# Patient Record
Sex: Female | Born: 1935
Health system: Southern US, Community
[De-identification: ages and names within clinical notes are randomized; demographics above are authoritative.]

## PROBLEM LIST (undated history)

## (undated) DIAGNOSIS — M1990 Unspecified osteoarthritis, unspecified site: Secondary | ICD-10-CM

## (undated) DIAGNOSIS — M810 Age-related osteoporosis without current pathological fracture: Secondary | ICD-10-CM

## (undated) DIAGNOSIS — E079 Disorder of thyroid, unspecified: Secondary | ICD-10-CM

## (undated) DIAGNOSIS — I10 Essential (primary) hypertension: Secondary | ICD-10-CM

## (undated) DIAGNOSIS — E785 Hyperlipidemia, unspecified: Secondary | ICD-10-CM

## (undated) DIAGNOSIS — C50919 Malignant neoplasm of unspecified site of unspecified female breast: Secondary | ICD-10-CM

## (undated) HISTORY — DX: Malignant neoplasm of unspecified site of unspecified female breast: C50.919

## (undated) HISTORY — PX: APPENDECTOMY: SHX54

## (undated) HISTORY — PX: COLONOSCOPY: SHX174

## (undated) HISTORY — DX: Unspecified osteoarthritis, unspecified site: M19.90

## (undated) HISTORY — PX: ABDOMINAL HYSTERECTOMY: SHX81

## (undated) HISTORY — PX: OTHER SURGICAL HISTORY: SHX169

## (undated) HISTORY — PX: MOUTH SURGERY: SHX715

---

## 1997-09-20 ENCOUNTER — Other Ambulatory Visit: Admission: RE | Admit: 1997-09-20 | Discharge: 1997-09-20 | Payer: Self-pay | Admitting: *Deleted

## 1998-09-23 ENCOUNTER — Other Ambulatory Visit: Admission: RE | Admit: 1998-09-23 | Discharge: 1998-09-23 | Payer: Self-pay | Admitting: *Deleted

## 1999-09-26 ENCOUNTER — Other Ambulatory Visit: Admission: RE | Admit: 1999-09-26 | Discharge: 1999-09-26 | Payer: Self-pay | Admitting: *Deleted

## 2001-10-01 ENCOUNTER — Other Ambulatory Visit: Admission: RE | Admit: 2001-10-01 | Discharge: 2001-10-01 | Payer: Self-pay | Admitting: Family Medicine

## 2005-09-28 ENCOUNTER — Other Ambulatory Visit: Admission: RE | Admit: 2005-09-28 | Discharge: 2005-09-28 | Payer: Self-pay | Admitting: Family Medicine

## 2010-09-08 ENCOUNTER — Ambulatory Visit
Admission: RE | Admit: 2010-09-08 | Discharge: 2010-09-08 | Disposition: A | Discharge status: Home / Self Care | Payer: Medicare Other | Source: Ambulatory Visit | Attending: Family Medicine | Admitting: Family Medicine

## 2010-09-08 ENCOUNTER — Other Ambulatory Visit: Payer: Self-pay | Admitting: Family Medicine

## 2010-09-08 DIAGNOSIS — M25559 Pain in unspecified hip: Secondary | ICD-10-CM

## 2011-04-13 DIAGNOSIS — M84353A Stress fracture, unspecified femur, initial encounter for fracture: Secondary | ICD-10-CM | POA: Diagnosis not present

## 2011-04-16 DIAGNOSIS — M84353A Stress fracture, unspecified femur, initial encounter for fracture: Secondary | ICD-10-CM | POA: Diagnosis not present

## 2011-04-17 DIAGNOSIS — M84353A Stress fracture, unspecified femur, initial encounter for fracture: Secondary | ICD-10-CM | POA: Diagnosis not present

## 2011-04-19 DIAGNOSIS — M84353A Stress fracture, unspecified femur, initial encounter for fracture: Secondary | ICD-10-CM | POA: Diagnosis not present

## 2011-06-11 DIAGNOSIS — H40059 Ocular hypertension, unspecified eye: Secondary | ICD-10-CM | POA: Diagnosis not present

## 2011-08-16 DIAGNOSIS — M25519 Pain in unspecified shoulder: Secondary | ICD-10-CM | POA: Diagnosis not present

## 2011-08-16 DIAGNOSIS — M949 Disorder of cartilage, unspecified: Secondary | ICD-10-CM | POA: Diagnosis not present

## 2011-08-16 DIAGNOSIS — E785 Hyperlipidemia, unspecified: Secondary | ICD-10-CM | POA: Diagnosis not present

## 2011-08-16 DIAGNOSIS — Z1331 Encounter for screening for depression: Secondary | ICD-10-CM | POA: Diagnosis not present

## 2011-08-16 DIAGNOSIS — I10 Essential (primary) hypertension: Secondary | ICD-10-CM | POA: Diagnosis not present

## 2011-08-16 DIAGNOSIS — E039 Hypothyroidism, unspecified: Secondary | ICD-10-CM | POA: Diagnosis not present

## 2011-08-16 DIAGNOSIS — Z791 Long term (current) use of non-steroidal anti-inflammatories (NSAID): Secondary | ICD-10-CM | POA: Diagnosis not present

## 2011-08-16 DIAGNOSIS — G479 Sleep disorder, unspecified: Secondary | ICD-10-CM | POA: Diagnosis not present

## 2011-08-16 DIAGNOSIS — M899 Disorder of bone, unspecified: Secondary | ICD-10-CM | POA: Diagnosis not present

## 2011-08-23 DIAGNOSIS — M949 Disorder of cartilage, unspecified: Secondary | ICD-10-CM | POA: Diagnosis not present

## 2011-08-23 DIAGNOSIS — M899 Disorder of bone, unspecified: Secondary | ICD-10-CM | POA: Diagnosis not present

## 2011-09-04 DIAGNOSIS — M7500 Adhesive capsulitis of unspecified shoulder: Secondary | ICD-10-CM | POA: Diagnosis not present

## 2011-09-04 DIAGNOSIS — M25819 Other specified joint disorders, unspecified shoulder: Secondary | ICD-10-CM | POA: Diagnosis not present

## 2011-09-20 DIAGNOSIS — M949 Disorder of cartilage, unspecified: Secondary | ICD-10-CM | POA: Diagnosis not present

## 2011-09-20 DIAGNOSIS — M899 Disorder of bone, unspecified: Secondary | ICD-10-CM | POA: Diagnosis not present

## 2011-10-17 DIAGNOSIS — K1370 Unspecified lesions of oral mucosa: Secondary | ICD-10-CM | POA: Diagnosis not present

## 2011-10-23 DIAGNOSIS — R221 Localized swelling, mass and lump, neck: Secondary | ICD-10-CM | POA: Diagnosis not present

## 2011-10-23 DIAGNOSIS — R220 Localized swelling, mass and lump, head: Secondary | ICD-10-CM | POA: Diagnosis not present

## 2011-11-21 DIAGNOSIS — M8708 Idiopathic aseptic necrosis of bone, other site: Secondary | ICD-10-CM | POA: Diagnosis not present

## 2012-01-01 DIAGNOSIS — H26499 Other secondary cataract, unspecified eye: Secondary | ICD-10-CM | POA: Diagnosis not present

## 2012-02-14 DIAGNOSIS — G479 Sleep disorder, unspecified: Secondary | ICD-10-CM | POA: Diagnosis not present

## 2012-02-14 DIAGNOSIS — Z23 Encounter for immunization: Secondary | ICD-10-CM | POA: Diagnosis not present

## 2012-02-14 DIAGNOSIS — M899 Disorder of bone, unspecified: Secondary | ICD-10-CM | POA: Diagnosis not present

## 2012-02-14 DIAGNOSIS — E785 Hyperlipidemia, unspecified: Secondary | ICD-10-CM | POA: Diagnosis not present

## 2012-02-14 DIAGNOSIS — E039 Hypothyroidism, unspecified: Secondary | ICD-10-CM | POA: Diagnosis not present

## 2012-02-14 DIAGNOSIS — M949 Disorder of cartilage, unspecified: Secondary | ICD-10-CM | POA: Diagnosis not present

## 2012-02-14 DIAGNOSIS — I10 Essential (primary) hypertension: Secondary | ICD-10-CM | POA: Diagnosis not present

## 2012-02-26 DIAGNOSIS — M899 Disorder of bone, unspecified: Secondary | ICD-10-CM | POA: Diagnosis not present

## 2012-05-28 ENCOUNTER — Emergency Department (HOSPITAL_COMMUNITY): Payer: Medicare PPO

## 2012-05-28 ENCOUNTER — Encounter (HOSPITAL_COMMUNITY): Payer: Self-pay | Admitting: Emergency Medicine

## 2012-05-28 ENCOUNTER — Inpatient Hospital Stay (HOSPITAL_COMMUNITY)
Admission: EM | Admit: 2012-05-28 | Discharge: 2012-05-29 | DRG: 313 | Disposition: A | Discharge status: Home / Self Care | Payer: Medicare PPO | Attending: Emergency Medicine | Admitting: Emergency Medicine

## 2012-05-28 DIAGNOSIS — E785 Hyperlipidemia, unspecified: Secondary | ICD-10-CM | POA: Diagnosis present

## 2012-05-28 DIAGNOSIS — R079 Chest pain, unspecified: Secondary | ICD-10-CM | POA: Diagnosis present

## 2012-05-28 DIAGNOSIS — E039 Hypothyroidism, unspecified: Secondary | ICD-10-CM | POA: Diagnosis present

## 2012-05-28 DIAGNOSIS — I10 Essential (primary) hypertension: Secondary | ICD-10-CM | POA: Diagnosis not present

## 2012-05-28 DIAGNOSIS — R0789 Other chest pain: Principal | ICD-10-CM | POA: Diagnosis present

## 2012-05-28 HISTORY — DX: Disorder of thyroid, unspecified: E07.9

## 2012-05-28 HISTORY — DX: Age-related osteoporosis without current pathological fracture: M81.0

## 2012-05-28 HISTORY — DX: Essential (primary) hypertension: I10

## 2012-05-28 HISTORY — DX: Hyperlipidemia, unspecified: E78.5

## 2012-05-28 LAB — BASIC METABOLIC PANEL
CO2: 25 mEq/L (ref 19–32)
Chloride: 104 mEq/L (ref 96–112)
GFR calc Af Amer: >90 mL/min (ref >90)
Potassium: 3.8 mEq/L (ref 3.5–5.1)

## 2012-05-28 LAB — POCT I-STAT TROPONIN I: Troponin i, poc: 0.01 ng/mL (ref 0.00–0.08)

## 2012-05-28 LAB — CBC
HCT: 38.5 % (ref 36.0–46.0)
Platelets: 168 10*3/uL (ref 150–400)
RBC: 4.23 MIL/uL (ref 3.87–5.11)
RDW: 12.2 % (ref 11.5–15.5)
WBC: 5.9 10*3/uL (ref 4.0–10.5)

## 2012-05-28 MED ORDER — SODIUM CHLORIDE 0.9 % IJ SOLN: 3.0000 mL | INTRAMUSCULAR | Status: DC | PRN

## 2012-05-28 MED ORDER — HYDROCHLOROTHIAZIDE 25 MG PO TABS
25.0000 mg | ORAL_TABLET | Freq: Every day | ORAL | Status: DC
  Administered 2012-05-29: 25 mg via ORAL
  Filled 2012-05-28: qty 1

## 2012-05-28 MED ORDER — SODIUM CHLORIDE 0.9 % IJ SOLN
3.0000 mL | Freq: Two times a day (BID) | INTRAMUSCULAR | Status: DC
  Administered 2012-05-29: 3 mL via INTRAVENOUS

## 2012-05-28 MED ORDER — MORPHINE SULFATE 2 MG/ML IJ SOLN: 1.0000 mg | INTRAMUSCULAR | Status: DC | PRN

## 2012-05-28 MED ORDER — SODIUM CHLORIDE 0.9 % IV SOLN: 250.0000 mL | INTRAVENOUS | Status: DC | PRN

## 2012-05-28 MED ORDER — SIMVASTATIN 40 MG PO TABS
40.0000 mg | ORAL_TABLET | Freq: Every evening | ORAL | Status: DC
  Administered 2012-05-29: 40 mg via ORAL
  Filled 2012-05-28: qty 1

## 2012-05-28 MED ORDER — ASPIRIN EC 81 MG PO TBEC
81.0000 mg | DELAYED_RELEASE_TABLET | Freq: Every morning | ORAL | Status: DC
  Administered 2012-05-29: 81 mg via ORAL
  Filled 2012-05-28: qty 1

## 2012-05-28 MED ORDER — NITROGLYCERIN 0.4 MG SL SUBL: 0.4000 mg | SUBLINGUAL_TABLET | SUBLINGUAL | Status: DC | PRN

## 2012-05-28 MED ORDER — PANTOPRAZOLE SODIUM 40 MG IV SOLR
40.0000 mg | Freq: Every day | INTRAVENOUS | Status: DC
  Administered 2012-05-28: 40 mg via INTRAVENOUS
  Filled 2012-05-28 (×2): qty 40

## 2012-05-28 MED ORDER — HEPARIN SODIUM (PORCINE) 5000 UNIT/ML IJ SOLN
5000.0000 [IU] | Freq: Three times a day (TID) | INTRAMUSCULAR | Status: DC
  Administered 2012-05-28 – 2012-05-29 (×3): 5000 [IU] via SUBCUTANEOUS
  Filled 2012-05-28 (×5): qty 1

## 2012-05-28 MED ORDER — LOSARTAN POTASSIUM-HCTZ 100-12.5 MG PO TABS: 1.0000 | ORAL_TABLET | Freq: Every morning | ORAL | Status: DC

## 2012-05-28 MED ORDER — LEVOTHYROXINE SODIUM 50 MCG PO TABS
50.0000 ug | ORAL_TABLET | Freq: Every day | ORAL | Status: DC
  Administered 2012-05-29: 50 ug via ORAL
  Filled 2012-05-28 (×2): qty 1

## 2012-05-28 MED ORDER — LOSARTAN POTASSIUM 50 MG PO TABS
100.0000 mg | ORAL_TABLET | Freq: Every day | ORAL | Status: DC
  Administered 2012-05-29: 100 mg via ORAL
  Filled 2012-05-28: qty 2

## 2012-05-28 MED ORDER — VITAMIN E 180 MG (400 UNIT) PO CAPS
400.0000 [IU] | ORAL_CAPSULE | Freq: Every evening | ORAL | Status: DC
  Administered 2012-05-29: 400 [IU] via ORAL
  Filled 2012-05-28: qty 1

## 2012-05-28 NOTE — H&P (Signed)
[facility] History and Physical  [name]:[id number] DOB: 1926-07-29 DOA: 05/28/2012  Referring physician: Dr. Freida Busman PCP: No primary provider on file.  Specialists: none  Chief Complaint: Chest pain  HPI: Becky Montoya is a 79 y.o. female  With history of HTN, hpl,  and hypothyroidism presenting to the ed after an episode of chest pain which occurred earlier today while she was with her husband.  She states that she went from sitting to standing and developed chest discomfort which lasted 10 minutes.  Was described as a chest tightness which seemed to be located mid substernal.  She states that she took an aspirin rested and it improved.  Has denied any more chest discomfort.  Denies any hemoptysis, SOB, cough.  Also states that it seemed to go up into her left shoulder.  Denies any recent fever or chills  In ED troponin was negative and VSS.  Given history of chest discomfort we were contacted for further evaluation and recommendations   Review of Systems: 10 point review of system reviewed and negative unless otherwise mentioned above  Past Medical History  Diagnosis Date  . Hypertension   . Thyroid disease   . Hyperlipidemia   . Osteoporosis    Past Surgical History  Procedure Laterality Date  . Abdominal hysterectomy    . Fissures     Social History:  reports that she has never smoked. She has never used smokeless tobacco. She reports that she does not drink alcohol or use illicit drugs. Lives at home with husband  Can patient participate in ADLs? yes  Allergies  Allergen Reactions  . Wellbutrin (Bupropion) Other (See Comments)    Nervousness    History reviewed. No pertinent family history. Reportedly family history of heart disease. Mother died at 4 of heart disease  Prior to Admission medications   Medication Sig Start Date End Date Taking? Authorizing Provider  aspirin EC 81 MG tablet Take 81 mg by mouth every morning.   Yes Historical Provider,  MD  denosumab (PROLIA) 60 MG/ML SOLN injection Inject 60 mg into the skin every 6 (six) months. Administer in upper arm, thigh, or abdomen   Yes Historical Provider, MD  levothyroxine (SYNTHROID, LEVOTHROID) 50 MCG tablet Take 50 mcg by mouth every morning.   Yes Historical Provider, MD  losartan-hydrochlorothiazide (HYZAAR) 100-12.5 MG per tablet Take 1 tablet by mouth every morning.   Yes Historical Provider, MD  Misc Natural Products (LUTEIN 20) CAPS Take 1 capsule by mouth every morning.   Yes Historical Provider, MD  Multiple Vitamin (MULTIVITAMIN) LIQD Take 30 mLs by mouth every morning.    Yes Historical Provider, MD  simvastatin (ZOCOR) 40 MG tablet Take 40 mg by mouth every evening.   Yes Historical Provider, MD  vitamin E 400 UNIT capsule Take 400 Units by mouth every evening.   Yes Historical Provider, MD   Physical Exam: Filed Vitals:   05/28/12 1800  BP: 169/73  Pulse: 90  Temp: 98.2 F (36.8 C)  TempSrc: Oral  Resp: 20  SpO2: 96%     General:  Pt in NAD, Alert and oriented  Eyes: EOMI, non icteric  ENT: normal exterior appearance, moist mucous membranes  Neck: supple. No goiter  Cardiovascular: RRR, no MRG  Respiratory: CTA BL, no wheezes   Abdomen: soft, NT, ND  Skin: warm and dry  Musculoskeletal: no cyanosis or clubbing  Psychiatric: mood and affect appropriate  Neurologic: answers questions appropriately moves all extremities.  Labs on  Admission:  Basic Metabolic Panel:  Recent Labs Lab 05/28/12 1920  NA 140  K 3.8  CL 104  CO2 25  GLUCOSE 97  BUN 16  CREATININE 0.69  CALCIUM 9.1   Liver Function Tests: No results found for this basename: AST, ALT, ALKPHOS, BILITOT, PROT, ALBUMIN,  in the last 168 hours No results found for this basename: LIPASE, AMYLASE,  in the last 168 hours No results found for this basename: AMMONIA,  in the last 168 hours CBC:  Recent Labs Lab 05/28/12 1920  WBC 5.9  HGB 13.1  HCT 38.5  MCV 91.0  PLT 168    Cardiac Enzymes: No results found for this basename: CKTOTAL, CKMB, CKMBINDEX, TROPONINI,  in the last 168 hours  BNP (last 3 results) No results found for this basename: PROBNP,  in the last 8760 hours CBG: No results found for this basename: GLUCAP,  in the last 168 hours  Radiological Exams on Admission: Dg Chest 2 View  05/28/2012  *RADIOLOGY REPORT*  Clinical Data: Chest pain, hypertension  CHEST - 2 VIEW  Comparison: None.  Findings: Pulmonary hyperexpansion and central bronchitic changes. Prominent calcification of the anterior costochondral junctions. Cardiac and mediastinal contours are within normal limits.  There is atherosclerotic calcification in the transverse aorta.  No acute osseous abnormality.  IMPRESSION:  1.  No acute cardiopulmonary disease. 2.  Hyperinflation and central bronchitic changes as can be seen with COPD. 3.  Aortic atherosclerosis   Original Report Authenticated By: Malachy Moan, M.D.     EKG: Independently reviewed. Sinus rhythm no ST elevation or depression  Assessment/Plan Active Problems:   Chest pain Hypothyroidism htn  1. Chest pain - rule out ACS - telemetry monitoring - troponin x 3 first set in ED negative - cardiac diet - place on protonix - supplemental oxygen as needed - Will order prn nitroglycerin SL tablets for chest pain, morphine for severe chest pain - EKG in AM and prn chest pain  2. Hypothyroidism - stable continue home regimen  3. HTN - will monitor BP's and continue home regimen  4. DVT prophylaxis - heparin   Code Status: full Family Communication: spoke with patient and husband at bedside Disposition Plan:  Pending further results of work up.  Given history of chest tightness if no improvement would consider stress testing   Time spent: > 60 minutes  Penny Pia [facility] Pager [phone]  If 7PM-7AM, please contact night-coverage [email or web address] Password [facility] 05/28/2012, 9:54 PM

## 2012-05-28 NOTE — ED Provider Notes (Signed)
History     CSN: [id number]  Arrival date & time 05/28/12  1754   First MD Initiated Contact with Patient 05/28/12 1831      Chief Complaint  Patient presents with  . Chest Pain    (Consider location/radiation/quality/duration/timing/severity/associated sxs/prior treatment) Patient is a 79 y.o. female presenting with chest pain. The history is provided by the patient.  Chest Pain  patient here with substernal chest heaviness and pressure that began at rest which lasted for approximately 10-15 minutes. She has associated nausea but denies any dyspnea or diaphoresis. Patient took aspirin with some relief. Has no prior history of same. Patient has not had a return of her symptoms. She denies any history of coronary artery disease. She is currently pain free  Past Medical History  Diagnosis Date  . Hypertension   . Thyroid disease   . Hyperlipidemia     Past Surgical History  Procedure Laterality Date  . Abdominal hysterectomy      No family history on file.  History  Substance Use Topics  . Smoking status: Never Smoker   . Smokeless tobacco: Not on file  . Alcohol Use: No    OB History   Grav Para Term Preterm Abortions TAB SAB Ect Mult Living                  Review of Systems  Cardiovascular: Positive for chest pain.  All other systems reviewed and are negative.    Allergies  Wellbutrin  Home Medications   Current Outpatient Rx  Name  Route  Sig  Dispense  Refill  . aspirin EC 81 MG tablet   Oral   Take 81 mg by mouth every morning.         . denosumab (PROLIA) 60 MG/ML SOLN injection   Subcutaneous   Inject 60 mg into the skin every 6 (six) months. Administer in upper arm, thigh, or abdomen         . levothyroxine (SYNTHROID, LEVOTHROID) 50 MCG tablet   Oral   Take 50 mcg by mouth every morning.         Marland Kitchen losartan-hydrochlorothiazide (HYZAAR) 100-12.5 MG per tablet   Oral   Take 1 tablet by mouth every morning.         . Misc Natural  Products (LUTEIN 20) CAPS   Oral   Take 1 capsule by mouth every morning.         . Multiple Vitamin (MULTIVITAMIN) LIQD   Oral   Take 30 mLs by mouth every morning.          . simvastatin (ZOCOR) 40 MG tablet   Oral   Take 40 mg by mouth every evening.         . vitamin E 400 UNIT capsule   Oral   Take 400 Units by mouth every evening.           BP 169/73  Pulse 90  Temp(Src) 98.2 F (36.8 C) (Oral)  Resp 20  SpO2 96%  Physical Exam  Nursing note and vitals reviewed. Constitutional: She is oriented to person, place, and time. She appears well-developed and well-nourished.  Non-toxic appearance. No distress.  HENT:  Head: Normocephalic and atraumatic.  Eyes: Conjunctivae, EOM and lids are normal. Pupils are equal, round, and reactive to light.  Neck: Normal range of motion. Neck supple. No tracheal deviation present. No mass present.  Cardiovascular: Normal rate, regular rhythm and normal heart sounds.  Exam reveals no gallop.  No murmur heard. Pulmonary/Chest: Effort normal and breath sounds normal. No stridor. No respiratory distress. She has no decreased breath sounds. She has no wheezes. She has no rhonchi. She has no rales.  Abdominal: Soft. Normal appearance and bowel sounds are normal. She exhibits no distension. There is no tenderness. There is no rebound and no CVA tenderness.  Musculoskeletal: Normal range of motion. She exhibits no edema and no tenderness.  Neurological: She is alert and oriented to person, place, and time. She has normal strength. No cranial nerve deficit or sensory deficit. GCS eye subscore is 4. GCS verbal subscore is 5. GCS motor subscore is 6.  Skin: Skin is warm and dry. No abrasion and no rash noted.  Psychiatric: She has a normal mood and affect. Her speech is normal and behavior is normal.    ED Course  Procedures (including critical care time)  Labs Reviewed  CBC  BASIC METABOLIC PANEL   No results found.   No  diagnosis found.    MDM   Date: 05/28/2012  Rate: 83  Rhythm: normal sinus rhythm  QRS Axis: normal  Intervals: normal  ST/T Wave abnormalities: normal  Conduction Disutrbances:none  Narrative Interpretation:   Old EKG Reviewed: none available  Pt to be admiited for eval of chest pain        Toy Baker, MD 05/28/12 2053

## 2012-05-28 NOTE — ED Notes (Signed)
Pt reports chest pain that occurred earlier this evening - episode lasted approx , pain was described as mid chest pain heaviness. Pt denies shortness of breath, dizziness, or vomiting - admits to some mild nausea. Pt denies any chest pain at present. Pt also admits to prevalent family hx of cardiac disease, her mother passed away in her 56s from MI and her sister has had several MIs and another sister w/ other cardiac disease.

## 2012-05-28 NOTE — ED Notes (Signed)
Pt presenting to ed with c/o chest pain while she was at the doctors office with her husband. Pt denies chest pain at this time. Pt states she does have positive nausea no vomiting. Pt denies shortness of breath at this time. Pt states she called her pcp and was told to come to the ED

## 2012-05-29 ENCOUNTER — Encounter (HOSPITAL_COMMUNITY): Payer: Self-pay | Admitting: Physician Assistant

## 2012-05-29 DIAGNOSIS — E785 Hyperlipidemia, unspecified: Secondary | ICD-10-CM | POA: Diagnosis not present

## 2012-05-29 DIAGNOSIS — E039 Hypothyroidism, unspecified: Secondary | ICD-10-CM | POA: Diagnosis not present

## 2012-05-29 DIAGNOSIS — I10 Essential (primary) hypertension: Secondary | ICD-10-CM | POA: Diagnosis not present

## 2012-05-29 DIAGNOSIS — R079 Chest pain, unspecified: Secondary | ICD-10-CM | POA: Diagnosis not present

## 2012-05-29 LAB — TSH: TSH: 1.183 u[IU]/mL (ref 0.350–4.500)

## 2012-05-29 LAB — LIPID PANEL
Total CHOL/HDL Ratio: 3.5 RATIO
VLDL: 19 mg/dL (ref 0–40)

## 2012-05-29 LAB — CBC
HCT: 37.7 % (ref 36.0–46.0)
HCT: 38.5 % (ref 36.0–46.0)
Hemoglobin: 13.1 g/dL (ref 12.0–15.0)
MCH: 31.3 pg (ref 26.0–34.0)
MCV: 90.6 fL (ref 78.0–100.0)
MCV: 91.2 fL (ref 78.0–100.0)
RBC: 4.22 MIL/uL (ref 3.87–5.11)
RDW: 12.4 % (ref 11.5–15.5)
RDW: 12.5 % (ref 11.5–15.5)
WBC: 5.5 10*3/uL (ref 4.0–10.5)
WBC: 6 10*3/uL (ref 4.0–10.5)

## 2012-05-29 LAB — BASIC METABOLIC PANEL
BUN: 15 mg/dL (ref 6–23)
Calcium: 8.6 mg/dL (ref 8.4–10.5)
Chloride: 107 mEq/L (ref 96–112)
Creatinine, Ser: 0.72 mg/dL (ref 0.50–1.10)
GFR calc Af Amer: 90 mL/min — ABNORMAL LOW (ref >90)

## 2012-05-29 LAB — MAGNESIUM: Magnesium: 2.1 mg/dL (ref 1.5–2.5)

## 2012-05-29 LAB — CREATININE, SERUM
GFR calc Af Amer: 90 mL/min — ABNORMAL LOW (ref >90)
GFR calc non Af Amer: 77 mL/min — ABNORMAL LOW (ref >90)

## 2012-05-29 LAB — PHOSPHORUS: Phosphorus: 2.8 mg/dL (ref 2.3–4.6)

## 2012-05-29 MED ORDER — BIOTENE DRY MOUTH MT LIQD
15.0000 mL | Freq: Two times a day (BID) | OROMUCOSAL | Status: DC
  Administered 2012-05-29: 15 mL via OROMUCOSAL

## 2012-05-29 MED ORDER — PANTOPRAZOLE SODIUM 40 MG PO TBEC: 40.0000 mg | DELAYED_RELEASE_TABLET | Freq: Every day | ORAL | Status: DC

## 2012-05-29 NOTE — Consult Note (Signed)
CARDIOLOGY CONSULT NOTE   Patient ID: Becky Montoya MRN: [id number] DOB/AGE: 09-01-1926 79 y.o.  Admit date: 05/28/2012  Primary Physician  Dr Laurann Montana Primary Cardiologist   New Reason for Consultation   Chest pain  JYN:[id number] Becky Montoya is a 79 y.o. female with no history of CAD. She was in her usual state of health yesterday when she bent over to look at something. She felt a pain in her upper left chest that resolved when she straightened up. However, a few minutes later, she had sudden onset of substernal chest pressure, at 5 or 6/10. She doesn't believe she was short of breath but was slightly nauseated. She was not diaphoretic. The episode lasted about 15 minutes. She took and aspirin but tried no other medical therapy for it. She went home and became concerned so she called Dr. Lucilla Lame office. Dr. Jeannie Fend. was not available so the staff told her to come to the emergency room. She did so. Her chest pain has not returned. She has never had chest pain before. She is generally able to do work around the house and yard without becoming short of breath. She has balance problems so she doesn't do a lot of steps, but if she holds on she is able to walk an entire flight of steps without stopping and without feeling short of breath. She last did this approximately 2 weeks ago. Currently she is resting comfortably.   Past Medical History  Diagnosis Date  . Hypertension   . Thyroid disease   . Hyperlipidemia   . Osteoporosis      Past Surgical History  Procedure Laterality Date  . Abdominal hysterectomy    . Fissures      Allergies  Allergen Reactions  . Wellbutrin (Bupropion) Other (See Comments)    Nervousness    I have reviewed the patient's current medications . antiseptic oral rinse  15 mL Mouth Rinse BID  . aspirin EC  81 mg Oral q morning - 10a  . heparin  5,000 Units Subcutaneous Q8H  . hydrochlorothiazide  25 mg Oral Daily  . levothyroxine  50 mcg Oral QAC breakfast  .  losartan  100 mg Oral Daily  . pantoprazole  40 mg Oral QHS  . simvastatin  40 mg Oral QPM  . sodium chloride  3 mL Intravenous Q12H  . sodium chloride  3 mL Intravenous Q12H  . vitamin E  400 Units Oral QPM     sodium chloride, morphine injection, nitroGLYCERIN, sodium chloride  Prior to Admission medications   Medication Sig Start Date End Date Taking? Authorizing Provider  aspirin EC 81 MG tablet Take 81 mg by mouth every morning.   Yes Historical Provider, MD  denosumab (PROLIA) 60 MG/ML SOLN injection Inject 60 mg into the skin every 6 (six) months. Administer in upper arm, thigh, or abdomen   Yes Historical Provider, MD  levothyroxine (SYNTHROID, LEVOTHROID) 50 MCG tablet Take 50 mcg by mouth every morning.   Yes Historical Provider, MD  losartan-hydrochlorothiazide (HYZAAR) 100-12.5 MG per tablet Take 1 tablet by mouth every morning.   Yes Historical Provider, MD  Misc Natural Products (LUTEIN 20) CAPS Take 1 capsule by mouth every morning.   Yes Historical Provider, MD  Multiple Vitamin (MULTIVITAMIN) LIQD Take 30 mLs by mouth every morning.    Yes Historical Provider, MD  simvastatin (ZOCOR) 40 MG tablet Take 40 mg by mouth every evening.   Yes Historical Provider, MD  vitamin E 400 UNIT capsule  Take 400 Units by mouth every evening.   Yes Historical Provider, MD     History   Social History  . Marital Status: Married    Spouse Name: N/A    Number of Children: N/A  . Years of Education: N/A   Occupational History  . Retired [employer] park manager    Social History Main Topics  . Smoking status: Never Smoker   . Smokeless tobacco: Never Used  . Alcohol Use: No  . Drug Use: No  . Sexually Active: Not on file   Other Topics Concern  . Not on file   Social History Narrative   Lives with husband. Active with housework and church activities.    Family Status  Relation Status Death Age  . Mother Deceased 30    MI  . Father Deceased 72    CVA  . Sister Deceased 82     CAD/CABG, Dx in her 61s  . Son Deceased 78    MI  . Sister Deceased 25    CAD     ROS: She has musculoskeletal aches and pains. Full 14 point review of systems complete and found to be negative unless listed above.  Physical Exam: Blood pressure 125/65, pulse 82, temperature 97.9 F (36.6 C), temperature source Oral, resp. rate 18, height 5\' 3"  (1.6 m), weight 136 lb 7.4 oz (61.9 kg), SpO2 96.00%.  General: Well developed, well nourished, elderly female in no acute distress Head: Eyes PERRLA, No xanthomas.   Normocephalic and atraumatic, oropharynx without edema or exudate. Dentition: Good Lungs: Clear bilaterally except a few dry rales Heart: HRRR S1 S2, no rub/gallop, no murmur. pulses are 2+ all 4 extrem.   Neck: No carotid bruits. No lymphadenopathy.  JVD not elevated. Abdomen: Bowel sounds present, abdomen soft and non-tender without masses or hernias noted. Msk:  No spine or cva tenderness. No weakness, no joint deformities or effusions. Extremities: No clubbing or cyanosis. No edema.  Neuro: Alert and oriented X 3. No focal deficits noted. Psych:  Good affect, responds appropriately Skin: No rashes or lesions noted.  Labs:   Lab Results  Component Value Date   WBC 6.0 05/29/2012   HGB 13.0 05/29/2012   HCT 37.7 05/29/2012   MCV 90.6 05/29/2012   PLT 159 05/29/2012   No results found for this basename: INR,  in the last 72 hours   Recent Labs Lab 05/29/12 0539  NA 142  K 3.7  CL 107  CO2 25  BUN 15  CREATININE 0.72  CALCIUM 8.6  GLUCOSE 95   Magnesium  Date Value Range Status  05/28/2012 2.1  1.5 - 2.5 mg/dL Final    Recent Labs  78/46/96 2350 05/29/12 0539 05/29/12 1130  TROPONINI <0.30 <0.30 <0.30    Recent Labs  05/28/12 1934  TROPIPOC 0.01   Lab Results  Component Value Date   CHOL 197 05/29/2012   HDL 56 05/29/2012   LDLCALC 122* 05/29/2012   TRIG 95 05/29/2012   TSH  Date/Time Value Range Status  05/28/2012 11:50 PM 1.183  0.350 - 4.500  uIU/mL Final   ECG: 18-May-2012 18:08:13 [facility] ED ROUTINE RECORD SINUS RHYTHM ~ normal P axis, V-rate 50- 99 PROBABLE LEFT ATRIAL ABNORMALITY ~ P >35mS, <-0.77mV V1 ABNRM R PROG, CONSIDER ASMI OR LEAD PLACEMENT ~ Q >43mS, diminished R, V2 Standard 12 Lead Report ~ Not Confirmed Abnormal ECG 55mm/s 31mm/mV 150Hz  8.0.1 12SL 235 CID: [id number] Referred by: Unconfirmed Vent. rate 83 BPM PR  interval 168 ms QRS duration 86 ms QT/QTc 380/446 ms P-R-T axes 80 60 66  Radiology:  Dg Chest 2 View 05/28/2012  *RADIOLOGY REPORT*  Clinical Data: Chest pain, hypertension  CHEST - 2 VIEW  Comparison: None.  Findings: Pulmonary hyperexpansion and central bronchitic changes. Prominent calcification of the anterior costochondral junctions. Cardiac and mediastinal contours are within normal limits.  There is atherosclerotic calcification in the transverse aorta.  No acute osseous abnormality.  IMPRESSION:  1.  No acute cardiopulmonary disease. 2.  Hyperinflation and central bronchitic changes as can be seen with COPD. 3.  Aortic atherosclerosis   Original Report Authenticated By: Malachy Moan, M.D.     ASSESSMENT AND PLAN:   The patient was seen today by Dr [place], the patient evaluated and the data reviewed.   Principal Problem:   Chest pain - she has typical and atypical features. She has no history of exertional chest pain. Cardiac enzymes are negative for MI and her ECG has no ischemic changes. M.D. advise on stress testing (possibly as an outpatient) versus cardiac catheterization. With her family history and elevated LDL, consider adding a lipid-lowering medication. Her blood pressure and heart rate are sufficient to tolerate a low-dose beta blocker as well. Medication changes and further evaluation per M.D.  Otherwise, per primary M.D. Active Problems:   Hypothyroidism   Hypertension   Hyperlipidemia   Signed: Theodore Demark 05/29/2012, 2:58 PM Co-Sign MD Patient seen and  examined and history reviewed. Agree with above findings and plan. 79 yo WF admitted with single episode of chest pain lasting 10-15 minutes. Exam is unremarkable. ECG is normal. Cardiac enzymes are negative. She does have risk factors of HTN, HL, and Fam hx of CAD. Given negative initial evaluation I think she would be stable for discharge with outpatient myoview study. She does have some gait instability so it would need to be a Lexiscan study. We can schedule this in our office next week.  Theron Arista [facility] 05/29/2012 5:49 PM

## 2012-05-29 NOTE — Progress Notes (Signed)
This patient is receiving IV Protonix. Based on criteria approved by the Pharmacy and Therapeutics Committee, this medication is being converted to the equivalent oral dose form. These criteria include:   . The patient is eating (either orally or per tube) and/or has been taking other orally administered medications for at least 24 hours.  . This patient has no evidence of active gastrointestinal bleeding or impaired GI absorption (gastrectomy, short bowel, patient on TNA or NPO).   If you have questions about this conversion, please contact the pharmacy department.  Haynes Hoehn, PharmD 05/29/2012 11:12 AM  Pager: [phone]

## 2012-05-29 NOTE — Progress Notes (Signed)
Chaplain saw pt in response to referral by nursing.  Husband at bedside.   Pt grieving loss of sister last month.  Pt's other sister is 82 and in nursing home at [facility].  Experiencing sadness and anticipatory grief around sister's decline.  Chaplain provided grief education and support, spiritual and emotional support and prayed with pt.    Belva Crome  MDiv

## 2012-05-29 NOTE — Progress Notes (Signed)
[facility] PROGRESS NOTE  Becky Montoya ZOX:[id number] DOB: 1927/01/04 DOA: 05/28/2012 PCP: No primary provider on file.  Assessment/Plan: Chest discomfort -Clinically appears atypical -Strong family history of cardiac disease -EKG, troponins reassuring -Consult cardiology for risk stratification and outpatient followup -patient may ultimately need a LexiScan/ Myoview -continue aspirin  Hypothyroidism -Continue current Synthroid dose -TSH 1.18 Hyperlipidemia  -Continue statin   hypertension  -Continue losartan   Disposition Plan:   Home when cleared by cardiology          Procedures/Studies: Dg Chest 2 View  05/28/2012  *RADIOLOGY REPORT*  Clinical Data: Chest pain, hypertension  CHEST - 2 VIEW  Comparison: None.  Findings: Pulmonary hyperexpansion and central bronchitic changes. Prominent calcification of the anterior costochondral junctions. Cardiac and mediastinal contours are within normal limits.  There is atherosclerotic calcification in the transverse aorta.  No acute osseous abnormality.  IMPRESSION:  1.  No acute cardiopulmonary disease. 2.  Hyperinflation and central bronchitic changes as can be seen with COPD. 3.  Aortic atherosclerosis   Original Report Authenticated By: Malachy Moan, M.D.          Subjective: Patient denies fevers, chills, chest discomfort, short of breath, nausea, vomiting, diarrhea, abdominal pain, dysuria, hematuria, rashes.   Objective: Filed Vitals:   05/28/12 1800 05/28/12 2312 05/28/12 2318 05/29/12 0458  BP: 169/73  154/66 130/64  Pulse: 90  77 82  Temp: 98.2 F (36.8 C)  97.7 F (36.5 C) 97.7 F (36.5 C)  TempSrc: Oral  Oral Oral  Resp: 20  16 16   Height:   5\' 3"  (1.6 m)   Weight:   61.9 kg (136 lb 7.4 oz)   SpO2: 96% 98% 98% 98%   No intake or output data in the 24 hours ending 05/29/12 0752 Weight change:  Exam:   General:  Pt is alert, follows commands appropriately, not in acute distress  HEENT:  No icterus, No thrush, No neck mass, [place]/AT  Cardiovascular: RRR, S1/S2, no rubs, no gallops  Respiratory: CTA bilaterally, no wheezing, no crackles, no rhonchi  Abdomen: Soft/+BS, non tender, non distended, no guarding  Extremities: 1+ nonpitting edema, No lymphangitis, No petechiae, No rashes, no synovitis  Data Reviewed: Basic Metabolic Panel:  Recent Labs Lab 05/28/12 1920 05/28/12 2350 05/29/12 0539  NA 140  --  142  K 3.8  --  3.7  CL 104  --  107  CO2 25  --  25  GLUCOSE 97  --  95  BUN 16  --  15  CREATININE 0.69 0.72 0.72  CALCIUM 9.1  --  8.6  MG  --  2.1  --   PHOS  --  2.8  --    Liver Function Tests: No results found for this basename: AST, ALT, ALKPHOS, BILITOT, PROT, ALBUMIN,  in the last 168 hours No results found for this basename: LIPASE, AMYLASE,  in the last 168 hours No results found for this basename: AMMONIA,  in the last 168 hours CBC:  Recent Labs Lab 05/28/12 1920 05/28/12 2350 05/29/12 0539  WBC 5.9 5.5 6.0  HGB 13.1 13.1 13.0  HCT 38.5 38.5 37.7  MCV 91.0 91.2 90.6  PLT 168 156 159   Cardiac Enzymes:  Recent Labs Lab 05/28/12 2350 05/29/12 0539  TROPONINI <0.30 <0.30   BNP: No components found with this basename: POCBNP,  CBG: No results found for this basename: GLUCAP,  in the last 168 hours  No results found for this or any previous  visit (from the past 240 hour(s)).   Scheduled Meds: . antiseptic oral rinse  15 mL Mouth Rinse BID  . aspirin EC  81 mg Oral q morning - 10a  . heparin  5,000 Units Subcutaneous Q8H  . hydrochlorothiazide  25 mg Oral Daily  . levothyroxine  50 mcg Oral QAC breakfast  . losartan  100 mg Oral Daily  . pantoprazole (PROTONIX) IV  40 mg Intravenous QHS  . simvastatin  40 mg Oral QPM  . sodium chloride  3 mL Intravenous Q12H  . sodium chloride  3 mL Intravenous Q12H  . vitamin E  400 Units Oral QPM   Continuous Infusions:    Dasiah Hooley, DO  [facility] Pager [phone]  If  7PM-7AM, please contact night-coverage [email or web address] Password [facility] 05/29/2012, 7:52 AM   LOS: 1 day

## 2012-05-29 NOTE — Discharge Summary (Signed)
Physician Discharge Summary  RUSHIE BRAZEL NGE:[id number] DOB: Nov 04, 1926 DOA: 05/28/2012  PCP: No primary provider on file.  Admit date: 05/28/2012 Discharge date: 05/29/2012  Recommendations for Outpatient Follow-up:  1. Pt will need to follow up with PCP in 2 weeks post discharge 2. Please obtain BMP to evaluate electrolytes and kidney function 3. Please also check CBC to evaluate Hg and Hct levels 4. Call Cardiology, Dr. Peter Swaziland to followup and to schedule outpatient lexiscan  Discharge Diagnoses:  Principal Problem:   Chest pain Active Problems:   Hypothyroidism   Hypertension   Hyperlipidemia Chest discomfort  -Clinically appears atypical  -Strong family history of cardiac disease  -EKG, troponins reassuring  -Consult cardiology for risk stratification and outpatient followup  -Dr. Peter Swaziland saw patient and will set up outpatient lexiscan -He cleared patient for discharge -patient may ultimately need a LexiScan/ Myoview  -continue aspirin 81mg  daily - The patient remained asymptomatic without any further chest discomfort during the hospitalization. She remained hemodynamically stable without any dysrhythmias on telemetry. - Patient's magnesium 2.1, phosphorus 2.8, serum creatinine is 0.69 Hypothyroidism  -Continue current Synthroid dose  -TSH 1.18  Hyperlipidemia  -Continue statin  hypertension  -Continue losartan    Discharge Condition: Stable  Disposition:  discharge home  Diet: Heart healthy Wt Readings from Last 3 Encounters:  05/28/12 61.9 kg (136 lb 7.4 oz)    History of present illness:  79 y.o. female with no history of CAD. She was in her usual state of health yesterday when she bent over to look at something. She felt a pain in her upper left chest that resolved when she straightened up. However, a few minutes later, she had sudden onset of substernal chest pressure, at 5 or 6/10. She doesn't believe she was short of breath but was slightly  nauseated. She was not diaphoretic. The episode lasted about 15 minutes. She took and aspirin but tried no other medical therapy for it. She went home and became concerned so she called Dr. Lucilla Lame office. Dr. Cliffton Asters was not available so the staff told her to come to the emergency room. She did so. Her chest pain has not returned. She has never had chest pain before. She is generally able to do work around the house and yard without becoming short of breath. She has balance problems so she doesn't do a lot of steps, but if she holds on she is able to walk an entire flight of steps without stopping and without feeling short of breath. She last did this approximately 2 weeks ago. Currently she is resting comfortably.     Consultants: Cardiology, Dr. Peter Swaziland   Discharge Exam: Filed Vitals:   05/29/12 1412  BP: 125/65  Pulse: 82  Temp: 97.9 F (36.6 C)  Resp: 18   Filed Vitals:   05/28/12 2318 05/29/12 0458 05/29/12 0900 05/29/12 1412  BP: 154/66 130/64 111/60 125/65  Pulse: 77 82 92 82  Temp: 97.7 F (36.5 C) 97.7 F (36.5 C) 98.2 F (36.8 C) 97.9 F (36.6 C)  TempSrc: Oral Oral Oral Oral  Resp: 16 16 17 18   Height: 5\' 3"  (1.6 m)     Weight: 61.9 kg (136 lb 7.4 oz)     SpO2: 98% 98% 98% 96%   General: A&O x 3, NAD, pleasant, cooperative Cardiovascular: RRR, no rub, no gallop, no S3 Respiratory: CTAB, no wheeze, no rhonchi Abdomen:soft, nontender, nondistended, positive bowel sounds Extremities: No edema, No lymphangitis, no petechiae  Discharge Instructions  Medication List    ASK your doctor about these medications       aspirin EC 81 MG tablet  Take 81 mg by mouth every morning.     denosumab 60 MG/ML Soln injection  Commonly known as:  PROLIA  Inject 60 mg into the skin every 6 (six) months. Administer in upper arm, thigh, or abdomen     levothyroxine 50 MCG tablet  Commonly known as:  SYNTHROID, LEVOTHROID  Take 50 mcg by mouth every morning.      losartan-hydrochlorothiazide 100-12.5 MG per tablet  Commonly known as:  HYZAAR  Take 1 tablet by mouth every morning.     LUTEIN 20 Caps  Take 1 capsule by mouth every morning.     multivitamin Liqd  Take 30 mLs by mouth every morning.     simvastatin 40 MG tablet  Commonly known as:  ZOCOR  Take 40 mg by mouth every evening.     vitamin E 400 UNIT capsule  Take 400 Units by mouth every evening.         The results of significant diagnostics from this hospitalization (including imaging, microbiology, ancillary and laboratory) are listed below for reference.    Significant Diagnostic Studies: Dg Chest 2 View  05/28/2012  *RADIOLOGY REPORT*  Clinical Data: Chest pain, hypertension  CHEST - 2 VIEW  Comparison: None.  Findings: Pulmonary hyperexpansion and central bronchitic changes. Prominent calcification of the anterior costochondral junctions. Cardiac and mediastinal contours are within normal limits.  There is atherosclerotic calcification in the transverse aorta.  No acute osseous abnormality.  IMPRESSION:  1.  No acute cardiopulmonary disease. 2.  Hyperinflation and central bronchitic changes as can be seen with COPD. 3.  Aortic atherosclerosis   Original Report Authenticated By: Malachy Moan, M.D.      Microbiology: No results found for this or any previous visit (from the past 240 hour(s)).   Labs: Basic Metabolic Panel:  Recent Labs Lab 05/28/12 1920 05/28/12 2350 05/29/12 0539  NA 140  --  142  K 3.8  --  3.7  CL 104  --  107  CO2 25  --  25  GLUCOSE 97  --  95  BUN 16  --  15  CREATININE 0.69 0.72 0.72  CALCIUM 9.1  --  8.6  MG  --  2.1  --   PHOS  --  2.8  --    Liver Function Tests: No results found for this basename: AST, ALT, ALKPHOS, BILITOT, PROT, ALBUMIN,  in the last 168 hours No results found for this basename: LIPASE, AMYLASE,  in the last 168 hours No results found for this basename: AMMONIA,  in the last 168 hours CBC:  Recent  Labs Lab 05/28/12 1920 05/28/12 2350 05/29/12 0539  WBC 5.9 5.5 6.0  HGB 13.1 13.1 13.0  HCT 38.5 38.5 37.7  MCV 91.0 91.2 90.6  PLT 168 156 159   Cardiac Enzymes:  Recent Labs Lab 05/28/12 2350 05/29/12 0539 05/29/12 1130  TROPONINI <0.30 <0.30 <0.30   BNP: No components found with this basename: POCBNP,  CBG: No results found for this basename: GLUCAP,  in the last 168 hours  Time coordinating discharge:  Greater than 30 minutes  Signed:  Contina Strain, DO [facility] Pager: [phone] 05/29/2012, 6:20 PM

## 2012-05-29 NOTE — Care Management Note (Signed)
    Page 1 of 1   05/29/2012     11:35:40 AM   CARE MANAGEMENT NOTE 05/29/2012  Patient:  Becky Montoya, Becky Montoya   Account Number:  [id number]  Date Initiated:  05/29/2012  Documentation initiated by:  Lanier Clam  Subjective/Objective Assessment:   ADMITTED W/CHEST PAIN.HX: HTN     Action/Plan:   FROM HOME.HAS PCP,PHARMACY   Anticipated DC Date:  05/29/2012   Anticipated DC Plan:  HOME/SELF CARE      DC Planning Services  CM consult      Choice offered to / List presented to:             Status of service:  Completed, signed off Medicare Important Message given?   (If response is "NO", the following Medicare IM given date fields will be blank) Date Medicare IM given:   Date Additional Medicare IM given:    Discharge Disposition:  HOME/SELF CARE  Per UR Regulation:  Reviewed for med. necessity/level of care/duration of stay  If discussed at Long Length of Stay Meetings, dates discussed:    Comments:  05/29/12 [facility] RN,BSN NCM [phone]

## 2012-05-29 NOTE — Progress Notes (Signed)
Patient d/c home at this time.Hulda Marin RN

## 2012-05-29 NOTE — Progress Notes (Signed)
Patient d/c instructions given, patient verbalized understanding,no c/o chest pain, stable, d/c home . Family at bedside. - Hulda Marin RN

## 2012-05-30 ENCOUNTER — Telehealth: Payer: Self-pay | Admitting: Cardiology

## 2012-05-30 DIAGNOSIS — R079 Chest pain, unspecified: Secondary | ICD-10-CM

## 2012-05-30 NOTE — Telephone Encounter (Signed)
New problem   Was seen by Dr. Swaziland in the hospital on yesterday advise by him to call the office set up a stress test.

## 2012-05-30 NOTE — Telephone Encounter (Signed)
Patient called no answer.LMTC. 

## 2012-06-03 NOTE — Telephone Encounter (Signed)
New Prob     Pt states Dr. Swaziland told her she needs a stress test when she was in the hospital 2/20. I don't see any orders in the system to schedule the stress test.

## 2012-06-03 NOTE — Telephone Encounter (Signed)
Spoke with patient she stated Dr.Jordan wanted to do a stress test after she was discharged from hospital.Patient was told schedulers will be calling back to schedule lexiscan and post hospital appointment.

## 2012-06-11 ENCOUNTER — Ambulatory Visit (HOSPITAL_COMMUNITY): Payer: Medicare PPO | Attending: Cardiology | Admitting: Radiology

## 2012-06-11 VITALS — BP 131/69 | Ht 63.0 in | Wt 136.0 lb

## 2012-06-11 DIAGNOSIS — R079 Chest pain, unspecified: Secondary | ICD-10-CM | POA: Insufficient documentation

## 2012-06-11 DIAGNOSIS — I10 Essential (primary) hypertension: Secondary | ICD-10-CM | POA: Insufficient documentation

## 2012-06-11 DIAGNOSIS — E785 Hyperlipidemia, unspecified: Secondary | ICD-10-CM | POA: Insufficient documentation

## 2012-06-11 MED ORDER — TECHNETIUM TC 99M SESTAMIBI GENERIC - CARDIOLITE
10.0000 | Freq: Once | INTRAVENOUS | Status: AC | PRN
  Administered 2012-06-11: 10 via INTRAVENOUS

## 2012-06-11 MED ORDER — TECHNETIUM TC 99M SESTAMIBI GENERIC - CARDIOLITE
30.0000 | Freq: Once | INTRAVENOUS | Status: AC | PRN
  Administered 2012-06-11: 30 via INTRAVENOUS

## 2012-06-11 MED ORDER — REGADENOSON 0.4 MG/5ML IV SOLN
0.4000 mg | Freq: Once | INTRAVENOUS | Status: AC
  Administered 2012-06-11: 0.4 mg via INTRAVENOUS

## 2012-06-11 NOTE — Progress Notes (Signed)
[facility] SITE 3 NUCLEAR MED [address] [place], [place] [address] [phone]    Cardiology Nuclear Med Study  Becky Montoya is a 79 y.o. female     MRN : [id number]     DOB: 1926-08-09  Procedure Date: 06/11/2012  Nuclear Med Background Indication for Stress Test:  Evaluation for Ischemia, and Patient seen in hospital on 05-28-12 for chest pain, enzymes negative History:  No prior CAD Hx Cardiac Risk Factors: Family History - CAD, Hypertension and Lipids  Symptoms:  Chest Pain and Nausea   Nuclear Pre-Procedure Caffeine/Decaff Intake:  9:00pm last night NPO After: 10:00pm   Lungs:  clear O2 Sat: 96% on room air. IV 0.9% NS with Angio Cath:  22g  IV Site: R Hand x 1, tolerated well IV Started by:  Irean Hong, RN  Chest Size (in):  34 Cup Size: C  Height: 5\' 3"  (1.6 m)  Weight:  136 lb (61.689 kg)  BMI:  Body mass index is 24.1 kg/(m^2). Tech Comments:  n/a    Nuclear Med Study 1 or 2 day study: 1 day  Stress Test Type:  Lexiscan  Reading MD: Marca Ancona, MD  Order Authorizing Zaivion Kundrat:  Peter Swaziland, MD  Resting Radionuclide: Technetium 42m Sestamibi  Resting Radionuclide Dose: 11.0 mCi   Stress Radionuclide:  Technetium 73m Sestamibi  Stress Radionuclide Dose: 33.0 mCi           Stress Protocol Rest HR: 77 Stress HR: 102  Rest BP: 131/69 Stress BP: 129/77  Exercise Time (min): n/a METS: n/a   Predicted Max HR: 137 bpm % Max HR: 74.45 bpm Rate Pressure Product: 46962   Dose of Adenosine (mg):  n/a Dose of Lexiscan: 0.4 mg  Dose of Atropine (mg): n/a Dose of Dobutamine: n/a mcg/kg/min (at max HR)  Stress Test Technologist: Milana Na, EMT-P  Nuclear Technologist:  Doyne Keel, CNMT     Rest Procedure:  Myocardial perfusion imaging was performed at rest 45 minutes following the intravenous administration of Technetium 75m Sestamibi. Rest ECG: NSR - Normal EKG  Stress Procedure:  The patient received IV Lexiscan 0.4 mg over 15-seconds.   Technetium 64m Sestamibi injected at 30-seconds. This patient had chest heaviness, abdominal pain, nausea, and a hot flash. Quantitative spect images were obtained after a 45 minute delay. Stress ECG: No significant change from baseline ECG  QPS Raw Data Images:  Normal; no motion artifact; normal heart/lung ratio. Stress Images:  Small, mild basal inferior perfusion defect.  Rest Images:  Small, mild basal inferior perfusion defect.  Subtraction (SDS):  Fixed small, mild basal inferior perfusion defect.  Transient Ischemic Dilatation (Normal <1.22):  0.99 Lung/Heart Ratio (Normal <0.45):  0.31  Quantitative Gated Spect Images QGS EDV:  33 ml QGS ESV:  4 ml  Impression Exercise Capacity:  Lexiscan with no exercise. BP Response:  Normal blood pressure response. Clinical Symptoms:  chest heaviness.  ECG Impression:  No significant ST segment change suggestive of ischemia. Comparison with Prior Nuclear Study: No images to compare  Overall Impression:  Low risk stress nuclear study.  Fixed small, mild basal inferior perfusion defect.  Given normal wall motion, suspect this is soft tissue attenuation.  No ischemia.   LV Ejection Fraction: 87%.  LV Wall Motion:  NL LV Function; NL Wall Motion  Marca Ancona 06/11/2012

## 2012-06-20 ENCOUNTER — Encounter: Payer: Self-pay | Admitting: Nurse Practitioner

## 2012-06-20 ENCOUNTER — Ambulatory Visit (INDEPENDENT_AMBULATORY_CARE_PROVIDER_SITE_OTHER): Payer: Medicare PPO | Admitting: Nurse Practitioner

## 2012-06-20 VITALS — BP 126/72 | HR 88 | Ht 63.0 in | Wt 138.8 lb

## 2012-06-20 DIAGNOSIS — R079 Chest pain, unspecified: Secondary | ICD-10-CM

## 2012-06-20 NOTE — Progress Notes (Signed)
Becky Montoya Date of Birth: 1926-05-09 Medical Record #[id number]  History of Present Illness: Becky Montoya is seen back today for a post hospital visit. She is seen for Dr. Swaziland. No known CAD. Does have HTN and HLD. Recently admitted with chest pain. Had an outpatient Myoview which was felt to be low risk.   She comes in today. She is here with her husband. She is doing ok. No more chest pain. Not short of breath. Feels ok. She is happy with her results. Is asking about how to lose weight. Sees Dr. Laurann Montana for her primary care.   Current Outpatient Prescriptions on File Prior to Visit  Medication Sig Dispense Refill  . aspirin EC 81 MG tablet Take 81 mg by mouth every morning.      . denosumab (PROLIA) 60 MG/ML SOLN injection Inject 60 mg into the skin every 6 (six) months. Administer in upper arm, thigh, or abdomen      . levothyroxine (SYNTHROID, LEVOTHROID) 50 MCG tablet Take 50 mcg by mouth every morning.      Marland Kitchen losartan-hydrochlorothiazide (HYZAAR) 100-12.5 MG per tablet Take 1 tablet by mouth every morning.      . Misc Natural Products (LUTEIN 20) CAPS Take 1 capsule by mouth every morning.      . Multiple Vitamin (MULTIVITAMIN) LIQD Take 30 mLs by mouth every morning.       . simvastatin (ZOCOR) 40 MG tablet Take 40 mg by mouth every evening.      . vitamin E 400 UNIT capsule Take 400 Units by mouth every evening.       No current facility-administered medications on file prior to visit.    Allergies  Allergen Reactions  . Wellbutrin (Bupropion) Other (See Comments)    Nervousness    Past Medical History  Diagnosis Date  . Hypertension   . Thyroid disease   . Hyperlipidemia   . Osteoporosis     Past Surgical History  Procedure Laterality Date  . Abdominal hysterectomy    . Fissures      History  Smoking status  . Never Smoker   Smokeless tobacco  . Never Used    History  Alcohol Use No    History reviewed. No pertinent family history.  Review  of Systems: The review of systems is per the HPI.  All other systems were reviewed and are negative.  Physical Exam: BP 126/72  Pulse 88  Ht 5\' 3"  (1.6 m)  Wt 138 lb 12.8 oz (62.959 kg)  BMI 24.59 kg/m2 Patient is very pleasant and in no acute distress. Skin is warm and dry. Color is normal.  HEENT is unremarkable. Normocephalic/atraumatic. PERRL. Sclera are nonicteric. Neck is supple. No masses. No JVD. Lungs are clear. Cardiac exam shows a regular rate and rhythm. Abdomen is soft. Extremities are without edema. Gait and ROM are intact. No gross neurologic deficits noted.   LABORATORY DATA:  Lab Results  Component Value Date   WBC 6.0 05/29/2012   HGB 13.0 05/29/2012   HCT 37.7 05/29/2012   PLT 159 05/29/2012   GLUCOSE 95 05/29/2012   CHOL 197 05/29/2012   TRIG 95 05/29/2012   HDL 56 05/29/2012   LDLCALC 122* 05/29/2012   NA 142 05/29/2012   K 3.7 05/29/2012   CL 107 05/29/2012   CREATININE 0.72 05/29/2012   BUN 15 05/29/2012   CO2 25 05/29/2012   TSH 1.183 05/28/2012   Myoview Overall Impression:   Low risk stress nuclear  study. Fixed small, mild basal inferior perfusion defect. Given normal wall motion, suspect this is soft tissue attenuation. No ischemia.  LV Ejection Fraction: 87%. LV Wall Motion: NL LV Function; NL Wall Motion   Marca Ancona  06/11/2012  Dg Chest 2 View  05/28/2012  IMPRESSION:  1.  No acute cardiopulmonary disease. 2.  Hyperinflation and central bronchitic changes as can be seen with COPD. 3.  Aortic atherosclerosis   Original Report Authenticated By: Malachy Moan, M.D.     Assessment / Plan: 1. Chest pain - low risk Myoview - doing well clinically. Would continue with her current regimen. See her back as needed.  2. HTN - BP looks ok.   3. HLD - on low dose Zocor.  We will see her back prn.   Patient is agreeable to this plan and will call if any problems develop in the interim.   Becky Macadamia, RN, ANP-C [facility] [address] [place], [place]  [address]

## 2012-06-20 NOTE — Patient Instructions (Signed)
Here are my tips to lose weight:  1. Drink only water. You do not need milk, juice, tea, soda or diet soda.  2. Do not eat anything "white". This includes white bread, potatoes, rice or mayo  3. Stay away from fried foods and sweets  4. Your portion should be the size of the palm of your hand.  5. Know what your weaknesses are and avoid.  6. Find an exercise you like and do it every day for 45 to 60 minutes.        Stay active  We will see you back as needed  Call the [facility] office at [phone] if you have any questions, problems or concerns.

## 2012-09-10 ENCOUNTER — Other Ambulatory Visit: Payer: Self-pay | Admitting: Radiology

## 2012-09-11 ENCOUNTER — Other Ambulatory Visit: Payer: Self-pay | Admitting: Radiology

## 2012-09-11 DIAGNOSIS — C50919 Malignant neoplasm of unspecified site of unspecified female breast: Secondary | ICD-10-CM

## 2012-09-15 ENCOUNTER — Telehealth: Payer: Self-pay | Admitting: *Deleted

## 2012-09-15 DIAGNOSIS — C50412 Malignant neoplasm of upper-outer quadrant of left female breast: Secondary | ICD-10-CM | POA: Insufficient documentation

## 2012-09-15 NOTE — Telephone Encounter (Signed)
Confirmed [facility] for 09/17/12 at 0800 .  Instructions and contact information given. 

## 2012-09-16 ENCOUNTER — Ambulatory Visit
Admission: RE | Admit: 2012-09-16 | Discharge: 2012-09-16 | Disposition: A | Discharge status: Home / Self Care | Payer: Medicare PPO | Source: Ambulatory Visit | Attending: Radiology | Admitting: Radiology

## 2012-09-16 DIAGNOSIS — C50919 Malignant neoplasm of unspecified site of unspecified female breast: Secondary | ICD-10-CM

## 2012-09-16 MED ORDER — GADOBENATE DIMEGLUMINE 529 MG/ML IV SOLN
12.0000 mL | Freq: Once | INTRAVENOUS | Status: AC | PRN
  Administered 2012-09-16: 12 mL via INTRAVENOUS

## 2012-09-17 ENCOUNTER — Ambulatory Visit
Admission: RE | Admit: 2012-09-17 | Discharge: 2012-09-17 | Disposition: A | Discharge status: Home / Self Care | Payer: Medicare PPO | Source: Ambulatory Visit | Attending: Radiation Oncology | Admitting: Radiation Oncology

## 2012-09-17 ENCOUNTER — Ambulatory Visit (HOSPITAL_BASED_OUTPATIENT_CLINIC_OR_DEPARTMENT_OTHER): Payer: Medicare PPO | Admitting: Oncology

## 2012-09-17 ENCOUNTER — Other Ambulatory Visit (HOSPITAL_BASED_OUTPATIENT_CLINIC_OR_DEPARTMENT_OTHER): Payer: Medicare PPO | Admitting: Lab

## 2012-09-17 ENCOUNTER — Encounter: Payer: Self-pay | Admitting: *Deleted

## 2012-09-17 ENCOUNTER — Ambulatory Visit (HOSPITAL_BASED_OUTPATIENT_CLINIC_OR_DEPARTMENT_OTHER): Payer: Medicare PPO | Admitting: General Surgery

## 2012-09-17 ENCOUNTER — Ambulatory Visit: Payer: Medicare PPO

## 2012-09-17 ENCOUNTER — Encounter (INDEPENDENT_AMBULATORY_CARE_PROVIDER_SITE_OTHER): Payer: Self-pay | Admitting: General Surgery

## 2012-09-17 ENCOUNTER — Encounter: Payer: Self-pay | Admitting: Oncology

## 2012-09-17 ENCOUNTER — Ambulatory Visit: Payer: Medicare PPO | Admitting: Physical Therapy

## 2012-09-17 VITALS — BP 120/73 | HR 83 | Temp 97.8°F | Resp 20 | Ht 63.0 in | Wt 137.8 lb

## 2012-09-17 DIAGNOSIS — C50412 Malignant neoplasm of upper-outer quadrant of left female breast: Secondary | ICD-10-CM

## 2012-09-17 DIAGNOSIS — C50419 Malignant neoplasm of upper-outer quadrant of unspecified female breast: Secondary | ICD-10-CM

## 2012-09-17 DIAGNOSIS — D0590 Unspecified type of carcinoma in situ of unspecified breast: Secondary | ICD-10-CM

## 2012-09-17 DIAGNOSIS — Z170 Estrogen receptor positive status [ER+]: Secondary | ICD-10-CM

## 2012-09-17 DIAGNOSIS — M810 Age-related osteoporosis without current pathological fracture: Secondary | ICD-10-CM

## 2012-09-17 LAB — COMPREHENSIVE METABOLIC PANEL (CC13)
ALT: 18 U/L (ref 0–55)
Alkaline Phosphatase: 76 U/L (ref 40–150)
Creatinine: 0.8 mg/dL (ref 0.6–1.1)
Glucose: 100 mg/dl — ABNORMAL HIGH (ref 70–99)
Sodium: 139 mEq/L (ref 136–145)
Total Bilirubin: 0.62 mg/dL (ref 0.20–1.20)
Total Protein: 6.8 g/dL (ref 6.4–8.3)

## 2012-09-17 LAB — CBC WITH DIFFERENTIAL/PLATELET
BASO%: 1.1 % (ref 0.0–2.0)
Basophils Absolute: 0.1 10*3/uL (ref 0.0–0.1)
Eosinophils Absolute: 0.2 10*3/uL (ref 0.0–0.5)
HCT: 39.5 % (ref 34.8–46.6)
HGB: 13.9 g/dL (ref 11.6–15.9)
LYMPH%: 25.8 % (ref 14.0–49.7)
MONO#: 0.6 10*3/uL (ref 0.1–0.9)
NEUT#: 3.1 10*3/uL (ref 1.5–6.5)
NEUT%: 58.6 % (ref 38.4–76.8)
Platelets: 145 10*3/uL (ref 145–400)
WBC: 5.3 10*3/uL (ref 3.9–10.3)
lymph#: 1.4 10*3/uL (ref 0.9–3.3)

## 2012-09-17 NOTE — Progress Notes (Signed)
Subjective:     Patient ID: Becky Montoya, female   DOB: Oct 23, 1926, 79 y.o.   MRN: [id number]  HPI We are asked to see the patient in consultation by Becky Montoya to evaluate her for her left breast cancer. The patient is an 79 year old white female who recently went for a routine screening mammogram. At that time she was found to have some abnormal calcifications in the left breast. The area measured 1.7 cm in diameter. This area was biopsied and came back as high-grade ductal carcinoma in situ. She was ER and PR positive. She denied any breast pain. She denied any discharge from her nipple. She only notes that she has had some fibrocystic disease of the breasts in the past.  Review of Systems  Constitutional: Negative.   HENT: Negative.   Eyes: Negative.   Respiratory: Negative.   Cardiovascular: Negative.   Gastrointestinal: Negative.   Endocrine: Negative.   Genitourinary: Negative.   Musculoskeletal: Negative.   Skin: Negative.   Allergic/Immunologic: Negative.   Neurological: Negative.   Hematological: Negative.   Psychiatric/Behavioral: Negative.        Objective:   Physical Exam  Constitutional: She is oriented to person, place, and time. She appears well-developed and well-nourished.  HENT:  Head: Normocephalic and atraumatic.  Eyes: Conjunctivae and EOM are normal. Pupils are equal, round, and reactive to light.  Neck: Normal range of motion. Neck supple.  Cardiovascular: Normal rate, regular rhythm and normal heart sounds.   Pulmonary/Chest: Effort normal and breath sounds normal.  There is no palpable mass in either breast. There is no palpable axillary supraclavicular or cervical lymphadenopathy.  Abdominal: Soft. Bowel sounds are normal. She exhibits no mass. There is no tenderness.  Musculoskeletal: Normal range of motion.  Lymphadenopathy:    She has no cervical adenopathy.  Neurological: She is alert and oriented to person, place, and time.  Skin: Skin is  warm and dry.  Psychiatric: She has a normal mood and affect. Her behavior is normal.       Assessment:     The patient appears to have a small area of DCIS in the upper outer quadrant of the left breast. I have discussed with her in detail the options for surgical treatment. At this point she favors breast conservation I think this is a reasonable option for her. She will not need a node evaluation. I've discussed with her in detail the risks and benefits of the operation as well as some of the technical aspects and she understands and wishes to proceed     Plan:     Plan for left breast wire localized lumpectomy

## 2012-09-17 NOTE — Progress Notes (Signed)
ID: Sharin Grave OB: [birth year]  MR#: [id number]  CSN#:[id number]  PCP: [name], MD GYN:   SUFelicity Pellegrini OTHER MD: Chipper Herb, Rick Cornella   HISTORY OF PRESENT ILLNESS: Montez had routine screening mammography at [facility] 09/03/2012. There was a possible abnormality noted in the left breast and on 09/05/2012 she underwent left diagnostic mammography which confirmed an area of pleomorphic microcalcifications in the upper outer quadrant of the left breast. Biopsy of this area 09/10/2012 showed ([id number]) ductal carcinoma in situ, grade 3, estrogen receptor positive at 100%, progesterone receptor positive at 6%. Bilateral breast MRI 09/16/2012 showed a 2.9 cm area of clumped linear enhancement in the upper outer quadrant of the left breast but no additional areas of concern and no evidence of adenopathy. Incidental hepatic cysts or hemangiomas were noted in the liver.  The patient's subsequent history is as detailed below  INTERVAL HISTORY: Juliauna was seen at the multidisciplinary breast cancer clinic on 09/17/2012 accompanied by her husband Roe Coombs and her daughter Noreene Larsson  REVIEW OF SYSTEMS: The patient was not having any particular symptoms leading to the mammogram, which was routinely scheduled. She also tolerated her biopsy without any unusual complications or side effects. She complains of occasional muscle aches and cramps, feels like she bruises easily, and has a thyroid problem. She has a history of atypical chest pain which was evaluated by cardiology and her stress test was interpreted as "low-risk". A detailed review of systems today was otherwise noncontributory  PAST MEDICAL HISTORY: Past Medical History  Diagnosis Date  . Hypertension   . Thyroid disease   . Hyperlipidemia   . Osteoporosis   . Breast cancer   . Arthritis     PAST SURGICAL HISTORY: Past Surgical History  Procedure Laterality Date  . Abdominal hysterectomy    . Fissures      FAMILY HISTORY Family  History  Problem Relation Age of Onset  . Breast cancer Sister    the patient's father died from a stroke a bit of 75. The patient's mother died from a heart attack at the age of 54. The patient had one brother and 5 sisters. The patient has one sister with a history of breast cancer diagnosed at age 30 and the patient's mother's mother was diagnosed with some kind of cancer at the age of 24. There is no history of ovarian cancer in the family to the patient's knowledge.  GYNECOLOGIC HISTORY:  Menarche age 72, she is GX P3, with her first live birth at age 24. She underwent hysterectomy with unilateral salpingo-oophorectomy in 1973. She never used birth control.  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 Her husband W. Mollye Guinta, is a retired Journalist, newspaper. Daughter Irma Newness lives in [place] and works as a Research scientist (physical sciences). Daughter Kirby Funk lives in [place] and work for [employer]. The patient had a son who did not survive. She has 4 grandchildren. She attends a [employer].    ADVANCED DIRECTIVES:  These are in place and the patient's daughter Noreene Larsson is her healthcare power of attorney. Noreene Larsson can be reached at [phone].   HEALTH MAINTENANCE: History  Substance Use Topics  . Smoking status: Never Smoker   . Smokeless tobacco: Never Used  . Alcohol Use: No     Colonoscopy: January 2011  PAP:  Bone density: July 2013?  Lipid panel:  Allergies  Allergen Reactions  . Wellbutrin (Bupropion) Other (See Comments)  Nervousness    Current Outpatient Prescriptions  Medication Sig Dispense Refill  . aspirin EC 81 MG tablet Take 81 mg by mouth every morning.      . Calcium-Vitamin D (CALTRATE 600 PLUS-VIT D PO) Take by mouth daily.      Marland Kitchen denosumab (PROLIA) 60 MG/ML SOLN injection Inject 60 mg into the skin every 6 (six) months. Administer in upper arm, thigh, or abdomen      . KRILL OIL PO Take by mouth daily.      Marland Kitchen  levothyroxine (SYNTHROID, LEVOTHROID) 50 MCG tablet Take 50 mcg by mouth every morning.      Marland Kitchen LORazepam (ATIVAN) 0.5 MG tablet Take 0.5 mg by mouth every 8 (eight) hours as needed for anxiety.      Marland Kitchen losartan-hydrochlorothiazide (HYZAAR) 100-12.5 MG per tablet Take 1 tablet by mouth every morning.      . Misc Natural Products (LUTEIN 20) CAPS Take 1 capsule by mouth every morning.      . Multiple Vitamin (MULTIVITAMIN) LIQD Take 30 mLs by mouth every morning.       . naproxen sodium (ANAPROX) 220 MG tablet Take 220 mg by mouth as needed.      . NON FORMULARY 2 oz at bedtime. artho bonejoint      . simvastatin (ZOCOR) 40 MG tablet Take 40 mg by mouth every evening.      . vitamin B-12 (CYANOCOBALAMIN) 1000 MCG tablet Take 1,000 mcg by mouth daily.      . vitamin E 400 UNIT capsule Take 400 Units by mouth every evening.       No current facility-administered medications for this visit.    OBJECTIVE: Elderly white woman in no acute distress Filed Vitals:   09/17/12 0835  BP: 120/73  Pulse: 83  Temp: 97.8 F (36.6 C)  Resp: 20     Body mass index is 24.42 kg/(m^2).    ECOG FS: 2  Sclerae unicteric Oropharynx clear No cervical or supraclavicular adenopathy Lungs no rales or rhonchi Heart regular rate and rhythm Abd benign MSK no focal spinal tenderness to vigorous percussion Neuro: non-focal, well-oriented, anxious affect Breasts: The right breast is unremarkable. The left breast is status post recent biopsy. There is a moderate ecchymosis. I do not palpate a well-defined mass. The left axilla is benign.   LAB RESULTS:  CMP     Component Value Date/Time   NA 139 09/17/2012 0815   NA 142 05/29/2012 0539   K 3.4* 09/17/2012 0815   K 3.7 05/29/2012 0539   CL 106 09/17/2012 0815   CL 107 05/29/2012 0539   CO2 22 09/17/2012 0815   CO2 25 05/29/2012 0539   GLUCOSE 100* 09/17/2012 0815   GLUCOSE 95 05/29/2012 0539   BUN 15.1 09/17/2012 0815   BUN 15 05/29/2012 0539   CREATININE 0.8  09/17/2012 0815   CREATININE 0.72 05/29/2012 0539   CALCIUM 9.4 09/17/2012 0815   CALCIUM 8.6 05/29/2012 0539   PROT 6.8 09/17/2012 0815   ALBUMIN 3.8 09/17/2012 0815   AST 21 09/17/2012 0815   ALT 18 09/17/2012 0815   ALKPHOS 76 09/17/2012 0815   BILITOT 0.62 09/17/2012 0815   GFRNONAA 77* 05/29/2012 0539   GFRAA 90* 05/29/2012 0539    I No results found for this basename: SPEP, UPEP,  kappa and lambda light chains    Lab Results  Component Value Date   WBC 5.3 09/17/2012   NEUTROABS 3.1 09/17/2012   HGB 13.9 09/17/2012  HCT 39.5 09/17/2012   MCV 91.2 09/17/2012   PLT 145 09/17/2012      Chemistry      Component Value Date/Time   NA 139 09/17/2012 0815   NA 142 05/29/2012 0539   K 3.4* 09/17/2012 0815   K 3.7 05/29/2012 0539   CL 106 09/17/2012 0815   CL 107 05/29/2012 0539   CO2 22 09/17/2012 0815   CO2 25 05/29/2012 0539   BUN 15.1 09/17/2012 0815   BUN 15 05/29/2012 0539   CREATININE 0.8 09/17/2012 0815   CREATININE 0.72 05/29/2012 0539      Component Value Date/Time   CALCIUM 9.4 09/17/2012 0815   CALCIUM 8.6 05/29/2012 0539   ALKPHOS 76 09/17/2012 0815   AST 21 09/17/2012 0815   ALT 18 09/17/2012 0815   BILITOT 0.62 09/17/2012 0815       No results found for this basename: LABCA2    No components found with this basename: LABCA125    No results found for this basename: INR,  in the last 168 hours  Urinalysis No results found for this basename: colorurine, appearanceur, labspec, phurine, glucoseu, hgbur, bilirubinur, ketonesur, proteinur, urobilinogen, nitrite, leukocytesur    STUDIES: Mr Breast Bilateral W Wo Contrast  09/17/2012   *RADIOLOGY REPORT*  Clinical Data: Recently diagnosed left breast ductal carcinoma in situ.  BUN and creatinine were obtained on site at [facility] at [address] Results:  BUN 15 mg/dL,  Creatinine 0.8 mg/dL.  BILATERAL BREAST MRI WITH AND WITHOUT CONTRAST  Technique: Multiplanar, multisequence MR images of both breasts were  obtained prior to and following the intravenous administration of 12ml of MultiHance.  Three dimensional images were evaluated at the independent DynaCad workstation.  Comparison:  Recent mammograms and biopsy at [facility].  Findings: Minimal background parenchymal enhancement in both breasts.  2.9 x 0.8 x 0.6 cm vertically oriented area of clumped linear enhancement in the upper outer quadrant of the left breast in the middle third.  This contains a biopsy marker clip artifact inferiorly.  No additional masses or areas of enhancement suspicious for malignancy in either breast.  No abnormal appearing lymph nodes.  Multiple rounded, smoothly marginated, homogeneously hyperintense masses in the liver on the inversion recovery and T2-weighted images.  No definite enhancement of these masses seen post contrast, limited by cardiac motion artifacts.  IMPRESSION:  1.  2.9 x 0.8 x 0.6 cm biopsy-proven ductal carcinoma in situ in the upper outer quadrant of the left breast. 2.  No evidence of malignancy elsewhere in either breast and no adenopathy. 3.  Multiple probable cysts or hemangiomas in the liver.  RECOMMENDATION: Treatment plan  THREE-DIMENSIONAL MR IMAGE RENDERING ON INDEPENDENT WORKSTATION:  Three-dimensional MR images were rendered by post-processing of the original MR data on an independent workstation.  The three- dimensional MR images were interpreted, and findings were reported in the accompanying complete MRI report for this study.  BI-RADS CATEGORY 6:  Known biopsy-proven malignancy - appropriate action should be taken.   Original Report Authenticated By: Beckie Salts, M.D.    ASSESSMENT: 79 y.o. [place] woman status post left breast biopsy 09/10/2012 for ductal carcinoma in situ, grade 3, estrogen and progesterone receptor positive.  PLAN: We spent the better part of today's hour-long visit discussing the biology of breast cancer and the specifics of Urvi's situation. She understands  that in noninvasive breast cancer is in itself not life threatening. Accordingly it is fine for her to choose lumpectomy over mastectomy  if that is her preference. We do not feel she would benefit from sentinel lymph node biopsy and that is not planned. She may benefit from radiation if margins were very close or if we have a surprise from the pathology, but otherwise radiation may be avoided if she takes anti-estrogens.  We did discuss anti-estrogens in some detail today but before making a definite decision he would be helpful for her to have a bone density. We have arranged for her to have this at [facility] before her next visit with me, which will be in July 23. We have also scheduled her to meet with our geneticist. At the next visit we should be of the make a definitive decision regarding adjuvant therapy  The patient knows to call for any problems that may develop before her return visit here  Lowella Dell, MD   09/17/2012 5:15 PM

## 2012-09-17 NOTE — Progress Notes (Signed)
[facility] Radiation Oncology NEW PATIENT EVALUATION  Name: Becky Montoya MRN: [id number]  Date:   09/17/2012           DOB: 25-Jul-1926  Status: outpatient   CC: Becky Bradford, MD  Becky Askew, MD    REFERRING PHYSICIAN: [name], MD   DIAGNOSIS: The encounter diagnosis was Cancer of upper-outer quadrant of female breast, left.    HISTORY OF PRESENT ILLNESS:  Becky Montoya is a 79 y.o. female who is seen today at the [facility]. for evaluation of her DCIS of the left breast. At the time of a screening mammogram at [facility] on 09/03/2012 she was noted to have a new group of microcalcifications in the upper-outer quadrant of the left breast over an area of 1.7 cm. A stereotactic biopsy on 09/10/2012 was diagnostic for high-grade DCIS with comedo-type necrosis. I understand that the DCIS was ER/PR positive. Breast MR on 09/16/2012 showed an area of linear enhancement measuring 2.9 x 0.8 x 0.6 cm. No other suspicious lesions were seen.  PREVIOUS RADIATION THERAPY: No   PAST MEDICAL HISTORY:  has a past medical history of Hypertension; Thyroid disease; Hyperlipidemia; Osteoporosis; Breast cancer; and Arthritis.     PAST SURGICAL HISTORY:  Past Surgical History  Procedure Laterality Date  . Abdominal hysterectomy    . Fissures       FAMILY HISTORY: family history includes Breast cancer in her sister. Her sister was diagnosed with breast cancer at age 66 but later died of a CVA. Her father died of a CVA at age 64 and her mother died from cardiac disease at age 42.   SOCIAL HISTORY:  reports that she has never smoked. She has never used smokeless tobacco. She reports that she does not drink alcohol or use illicit drugs. Married, 2 of 3 children living.   ALLERGIES: Wellbutrin   MEDICATIONS:  Current Outpatient Prescriptions  Medication Sig Dispense Refill  . aspirin EC 81 MG tablet Take 81 mg by mouth every morning.      . Calcium-Vitamin D (CALTRATE 600  PLUS-VIT D PO) Take by mouth daily.      Marland Kitchen denosumab (PROLIA) 60 MG/ML SOLN injection Inject 60 mg into the skin every 6 (six) months. Administer in upper arm, thigh, or abdomen      . KRILL OIL PO Take by mouth daily.      Marland Kitchen levothyroxine (SYNTHROID, LEVOTHROID) 50 MCG tablet Take 50 mcg by mouth every morning.      Marland Kitchen LORazepam (ATIVAN) 0.5 MG tablet Take 0.5 mg by mouth every 8 (eight) hours as needed for anxiety.      Marland Kitchen losartan-hydrochlorothiazide (HYZAAR) 100-12.5 MG per tablet Take 1 tablet by mouth every morning.      . Misc Natural Products (LUTEIN 20) CAPS Take 1 capsule by mouth every morning.      . Multiple Vitamin (MULTIVITAMIN) LIQD Take 30 mLs by mouth every morning.       . naproxen sodium (ANAPROX) 220 MG tablet Take 220 mg by mouth as needed.      . NON FORMULARY 2 oz at bedtime. artho bonejoint      . simvastatin (ZOCOR) 40 MG tablet Take 40 mg by mouth every evening.      . vitamin B-12 (CYANOCOBALAMIN) 1000 MCG tablet Take 1,000 mcg by mouth daily.      . vitamin E 400 UNIT capsule Take 400 Units by mouth every evening.  No current facility-administered medications for this encounter.     REVIEW OF SYSTEMS:  Pertinent items are noted in HPI.    PHYSICAL EXAM:  Alert and oriented 79 year old white female appearing younger than her stated age. Wt Readings from Last 3 Encounters:  09/17/12 137 lb 12.8 oz (62.506 kg)  06/20/12 138 lb 12.8 oz (62.959 kg)  06/11/12 136 lb (61.689 kg)   Temp Readings from Last 3 Encounters:  09/17/12 97.8 F (36.6 C) Oral  05/29/12 97.9 F (36.6 C) Oral   BP Readings from Last 3 Encounters:  09/17/12 120/73  06/20/12 126/72  06/11/12 131/69   Pulse Readings from Last 3 Encounters:  09/17/12 83  06/20/12 88  05/29/12 82   Head and neck examination: Grossly unremarkable. Nodes: Without palpable cervical, supraclavicular, or axillary lymphadenopathy. Chest: Lungs clear. Back: Without spinal or CVA tenderness. Heart: Regular  rate and rhythm. Breasts: There is a punctate biopsy wound at approximately 1:00 with a small area of ecchymosis. No masses are appreciated. Right breast without masses or lesions. Abdomen without hepatomegaly. Extremities: Without edema.    LABORATORY DATA:  Lab Results  Component Value Date   WBC 5.3 09/17/2012   HGB 13.9 09/17/2012   HCT 39.5 09/17/2012   MCV 91.2 09/17/2012   PLT 145 09/17/2012   Lab Results  Component Value Date   NA 139 09/17/2012   K 3.4* 09/17/2012   CL 106 09/17/2012   CO2 22 09/17/2012   Lab Results  Component Value Date   ALT 18 09/17/2012   AST 21 09/17/2012   ALKPHOS 76 09/17/2012   BILITOT 0.62 09/17/2012      IMPRESSION: High-grade DCIS of the left breast. I explained to the patient , her husband, and daughter that her local treatment options include mastectomy versus partial mastectomy with or without adjuvant radiation therapy, with without adjuvant hormone therapy. One could argue that if Becky Montoya for pain is widely negative margins then she may be treated with adjuvant hormone therapy alone. On the other hand, high-grade DCIS with comedonecrosis has a local recurrence rate of approximately 25-30% at 5 years without adjuvant radiation therapy. Risk factors for local recurrence include size, and surgical margins in addition to nuclear grade. I can make a final recommendation regarding the role of adjuvant radiation therapy after her definitive surgery. We spent sometime discussing the potential acute and late toxicities of radiation therapy. Her prognosis in terms of overall survival is excellent.   PLAN: As discussed above.  I spent 30 minutes minutes face to face with the patient and more than 50% of that time was spent in counseling and/or coordination of care.

## 2012-09-17 NOTE — Progress Notes (Signed)
I met Becky Montoya, her husband, and her daughter today in breast clinic.  She rated her distress as "0", saying that she has a very strong faith.  I encouraged her to attend Breast Cancer Support Group; she declined a referral to [facility].  I provided her with my contact information, in the event she needed support services.

## 2012-09-17 NOTE — Progress Notes (Signed)
Patient was not checked in by Lenise this morning and was told needed to see Database administrator. I gave her Lenise's card and explained if needed any assistance after her insurance paid to give Lenise a call.

## 2012-09-18 ENCOUNTER — Other Ambulatory Visit: Payer: Medicare PPO | Admitting: Lab

## 2012-09-18 ENCOUNTER — Ambulatory Visit (HOSPITAL_BASED_OUTPATIENT_CLINIC_OR_DEPARTMENT_OTHER): Payer: Medicare PPO | Admitting: Genetic Counselor

## 2012-09-18 DIAGNOSIS — IMO0002 Reserved for concepts with insufficient information to code with codable children: Secondary | ICD-10-CM

## 2012-09-18 DIAGNOSIS — C50419 Malignant neoplasm of upper-outer quadrant of unspecified female breast: Secondary | ICD-10-CM

## 2012-09-18 DIAGNOSIS — Z803 Family history of malignant neoplasm of breast: Secondary | ICD-10-CM

## 2012-09-18 DIAGNOSIS — D0590 Unspecified type of carcinoma in situ of unspecified breast: Secondary | ICD-10-CM

## 2012-09-19 ENCOUNTER — Telehealth: Payer: Self-pay | Admitting: Oncology

## 2012-09-19 ENCOUNTER — Encounter: Payer: Self-pay | Admitting: Genetic Counselor

## 2012-09-19 NOTE — Progress Notes (Signed)
Dr. Raymond Gurney Magrinat requested a consultation for genetic counseling and risk assessment for Becky Montoya, a 79 y.o. female, for discussion of her personal and family history of breast cancer.  She presents to clinic today to discuss the possibility of a genetic predisposition to cancer, and to further clarify her risks, as well as her family members' risks for cancer.   HISTORY OF PRESENT ILLNESS: In 2014, at the age of 48, Becky Montoya was diagnosed with DCIS of the breast. This was treated with lumpectomy and anti-estrogen therapy. The tumor is ER+/PR+.  The patient reports undergoing a hysterectomy at age 76 for fibroids.  One ovary is intact.   Past Medical History  Diagnosis Date  . Hypertension   . Thyroid disease   . Hyperlipidemia   . Osteoporosis   . Breast cancer   . Arthritis     Past Surgical History  Procedure Laterality Date  . Abdominal hysterectomy    . Fissures      History   Social History  . Marital Status: Married    Spouse Name: N/A    Number of Children: 3  . Years of Education: N/A   Occupational History  . Retired Theme park manager    Social History Main Topics  . Smoking status: Never Smoker   . Smokeless tobacco: Never Used  . Alcohol Use: No  . Drug Use: No  . Sexually Active: Not Currently   Other Topics Concern  . None   Social History Narrative   Lives with husband. Active with housework and church activities.    REPRODUCTIVE HISTORY AND PERSONAL RISK ASSESSMENT FACTORS: Menarche was at age 79.   postmenopausal Uterus Intact: no Ovaries Intact: only one is intact G3P3A0, first live birth at age 71  She has not previously undergone treatment for infertility.   Oral Contraceptive use: 0 years   She has used HRT in the past.    FAMILY HISTORY:  We obtained a detailed, 4-generation family history.  Significant diagnoses are listed below: Family History  Problem Relation Age of Onset  . Breast cancer Sister     dx in her  77s  . Cancer Maternal Grandmother     unknown form of cancer  . Heart attack Maternal Grandfather   . Cancer Cousin     maternal cousin with unknown form of cancer  The patient's son died of a heart attack at 14.  She had five sisters and one brother.  One brother and sister, who were twins, died at 41 month of age.  One sister was diagnosed with breast cancer in her 36s.  The patient had 8 maternal aunts and uncles and 7 paternal aunts and uncles, none of whom had cancer.  She has one maternal female cousin with an unknown cancer and her maternal grandmother had an unknown form of cancer.  Patient's maternal ancestors are of unknown descent, and paternal ancestors are of [place] descent. There is no reported Ashkenazi Jewish ancestry. There is no  known consanguinity.  GENETIC COUNSELING RISK ASSESSMENT, DISCUSSION, AND SUGGESTED FOLLOW UP: We reviewed the natural history and genetic etiology of sporadic, familial and hereditary cancer syndromes.  The patient's personal and family history of breast cancer is suggestive of the following possible diagnosis: sporadic or familial breast cancer  We discussed that based on her family and personal history she does not meet the medical criteria for performing genetic testing.  The good news is that the chance of having a hereditary  breast cancer is low.  If she learns that she has one more person on her maternal side who had breast cancer we could re-evaluate her risk.  The patient seemed comfortable with this information and had all questions answered.  She was given our phone number so that she could get ahold of me if her family history changes.   After considering the risks, benefits, and limitations, the patient declined genetic testing.   The patient was seen for a total of 60 minutes, greater than 50% of which was spent face-to-face counseling.  This plan is being carried out per Dr. Feliz Beam recommendations.  This note will also be sent to  the referring provider via the electronic medical record. The patient will be supplied with a summary of this genetic counseling discussion as well as educational information on the discussed hereditary cancer syndromes following the conclusion of their visit.   Patient was discussed with Dr. Drue Second.   _______________________________________________________________________ For Office Staff:  Number of people involved in session: 1 Was an Intern/ student involved with case: no

## 2012-09-19 NOTE — Telephone Encounter (Signed)
Called and lm with July appt info   Becky Montoya

## 2012-09-22 ENCOUNTER — Telehealth: Payer: Self-pay | Admitting: *Deleted

## 2012-09-22 NOTE — Telephone Encounter (Signed)
Left message for a return phone call.

## 2012-09-23 ENCOUNTER — Telehealth: Payer: Self-pay | Admitting: *Deleted

## 2012-09-23 NOTE — Telephone Encounter (Signed)
Spoke to pt concerning [facility] from 09/17/12.  Pt denies questions or concerns regarding dx or treatment care plan.  Confirmed surgery date and f/u appt with Dr. Darnelle Catalan.  Encourage pt to call with needs. Received verbal understanding.  Contact information given.

## 2012-09-25 ENCOUNTER — Telehealth (INDEPENDENT_AMBULATORY_CARE_PROVIDER_SITE_OTHER): Payer: Self-pay

## 2012-09-25 NOTE — Telephone Encounter (Signed)
Attempted to reach patient to answer any questions she may have.  No answer.

## 2012-10-03 ENCOUNTER — Encounter (HOSPITAL_BASED_OUTPATIENT_CLINIC_OR_DEPARTMENT_OTHER): Payer: Self-pay | Admitting: *Deleted

## 2012-10-03 NOTE — Progress Notes (Signed)
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 To come in 7/2 for bmet-

## 2012-10-08 ENCOUNTER — Encounter (HOSPITAL_BASED_OUTPATIENT_CLINIC_OR_DEPARTMENT_OTHER)
Admission: RE | Admit: 2012-10-08 | Discharge: 2012-10-08 | Disposition: A | Discharge status: Home / Self Care | Payer: Medicare PPO | Source: Ambulatory Visit | Attending: General Surgery | Admitting: General Surgery

## 2012-10-08 LAB — BASIC METABOLIC PANEL
BUN: 15 mg/dL (ref 6–23)
CO2: 25 mEq/L (ref 19–32)
Calcium: 9.1 mg/dL (ref 8.4–10.5)
GFR calc non Af Amer: 76 mL/min — ABNORMAL LOW (ref >90)
Glucose, Bld: 108 mg/dL — ABNORMAL HIGH (ref 70–99)
Potassium: 3.7 mEq/L (ref 3.5–5.1)

## 2012-10-09 ENCOUNTER — Encounter (HOSPITAL_BASED_OUTPATIENT_CLINIC_OR_DEPARTMENT_OTHER): Payer: Self-pay | Admitting: Anesthesiology

## 2012-10-09 ENCOUNTER — Encounter (HOSPITAL_BASED_OUTPATIENT_CLINIC_OR_DEPARTMENT_OTHER): Payer: Self-pay | Admitting: *Deleted

## 2012-10-09 ENCOUNTER — Ambulatory Visit (HOSPITAL_BASED_OUTPATIENT_CLINIC_OR_DEPARTMENT_OTHER)
Admission: RE | Admit: 2012-10-09 | Discharge: 2012-10-09 | Disposition: A | Discharge status: Home / Self Care | Payer: Medicare PPO | Source: Ambulatory Visit | Attending: General Surgery | Admitting: General Surgery

## 2012-10-09 ENCOUNTER — Ambulatory Visit (HOSPITAL_BASED_OUTPATIENT_CLINIC_OR_DEPARTMENT_OTHER): Payer: Medicare PPO | Admitting: Anesthesiology

## 2012-10-09 ENCOUNTER — Encounter (HOSPITAL_BASED_OUTPATIENT_CLINIC_OR_DEPARTMENT_OTHER)
Admission: RE | Disposition: A | Discharge status: Home / Self Care | Payer: Self-pay | Source: Ambulatory Visit | Attending: General Surgery

## 2012-10-09 DIAGNOSIS — E039 Hypothyroidism, unspecified: Secondary | ICD-10-CM | POA: Insufficient documentation

## 2012-10-09 DIAGNOSIS — I10 Essential (primary) hypertension: Secondary | ICD-10-CM | POA: Insufficient documentation

## 2012-10-09 DIAGNOSIS — M129 Arthropathy, unspecified: Secondary | ICD-10-CM | POA: Insufficient documentation

## 2012-10-09 DIAGNOSIS — Z170 Estrogen receptor positive status [ER+]: Secondary | ICD-10-CM | POA: Insufficient documentation

## 2012-10-09 DIAGNOSIS — C50412 Malignant neoplasm of upper-outer quadrant of left female breast: Secondary | ICD-10-CM

## 2012-10-09 DIAGNOSIS — D0590 Unspecified type of carcinoma in situ of unspecified breast: Secondary | ICD-10-CM | POA: Insufficient documentation

## 2012-10-09 HISTORY — PX: BREAST LUMPECTOMY WITH NEEDLE LOCALIZATION: SHX5759

## 2012-10-09 SURGERY — BREAST LUMPECTOMY WITH NEEDLE LOCALIZATION
Anesthesia: General | Site: Breast | Laterality: Left | Wound class: Clean

## 2012-10-09 MED ORDER — EPHEDRINE SULFATE 50 MG/ML IJ SOLN: INTRAMUSCULAR | Status: DC | PRN

## 2012-10-09 MED ORDER — OXYCODONE HCL 5 MG/5ML PO SOLN: 5.0000 mg | Freq: Once | ORAL | Status: DC | PRN

## 2012-10-09 MED ORDER — CEFAZOLIN SODIUM-DEXTROSE 2-3 GM-% IV SOLR
2.0000 g | INTRAVENOUS | Status: AC
  Administered 2012-10-09: 2 g via INTRAVENOUS

## 2012-10-09 MED ORDER — LACTATED RINGERS IV SOLN
INTRAVENOUS | Status: DC
  Administered 2012-10-09: 09:00:00 via INTRAVENOUS

## 2012-10-09 MED ORDER — FENTANYL CITRATE 0.05 MG/ML IJ SOLN
INTRAMUSCULAR | Status: DC | PRN
  Administered 2012-10-09: 50 ug via INTRAVENOUS

## 2012-10-09 MED ORDER — DEXAMETHASONE SODIUM PHOSPHATE 4 MG/ML IJ SOLN
INTRAMUSCULAR | Status: DC | PRN
  Administered 2012-10-09: 4 mg via INTRAVENOUS

## 2012-10-09 MED ORDER — EPHEDRINE SULFATE 50 MG/ML IJ SOLN
INTRAMUSCULAR | Status: DC | PRN
Start: 2012-10-09 — End: 2012-10-09
  Administered 2012-10-09: 10 mg via INTRAVENOUS
  Administered 2012-10-09: 5 mg via INTRAVENOUS

## 2012-10-09 MED ORDER — MIDAZOLAM HCL 2 MG/2ML IJ SOLN: 1.0000 mg | INTRAMUSCULAR | Status: DC | PRN

## 2012-10-09 MED ORDER — ONDANSETRON HCL 4 MG/2ML IJ SOLN: 4.0000 mg | Freq: Four times a day (QID) | INTRAMUSCULAR | Status: DC | PRN

## 2012-10-09 MED ORDER — LIDOCAINE HCL (CARDIAC) 20 MG/ML IV SOLN
INTRAVENOUS | Status: DC | PRN
  Administered 2012-10-09: 80 mg via INTRAVENOUS

## 2012-10-09 MED ORDER — OXYCODONE HCL 5 MG PO TABS: 5.0000 mg | ORAL_TABLET | Freq: Once | ORAL | Status: DC | PRN

## 2012-10-09 MED ORDER — ONDANSETRON HCL 4 MG/2ML IJ SOLN
INTRAMUSCULAR | Status: DC | PRN
  Administered 2012-10-09: 4 mg via INTRAVENOUS

## 2012-10-09 MED ORDER — CHLORHEXIDINE GLUCONATE 4 % EX LIQD: 1.0000 "application " | Freq: Once | CUTANEOUS | Status: DC

## 2012-10-09 MED ORDER — HYDROCODONE-ACETAMINOPHEN 5-325 MG PO TABS: 1.0000 | ORAL_TABLET | Freq: Four times a day (QID) | ORAL | Status: DC | PRN

## 2012-10-09 MED ORDER — FENTANYL CITRATE 0.05 MG/ML IJ SOLN: 50.0000 ug | INTRAMUSCULAR | Status: DC | PRN

## 2012-10-09 MED ORDER — BUPIVACAINE HCL (PF) 0.25 % IJ SOLN
INTRAMUSCULAR | Status: DC | PRN
  Administered 2012-10-09: 20 mL

## 2012-10-09 MED ORDER — PROPOFOL 10 MG/ML IV BOLUS
INTRAVENOUS | Status: DC | PRN
  Administered 2012-10-09: 30 mg via INTRAVENOUS
  Administered 2012-10-09: 120 mg via INTRAVENOUS

## 2012-10-09 MED ORDER — HYDROMORPHONE HCL PF 1 MG/ML IJ SOLN: 0.2500 mg | INTRAMUSCULAR | Status: DC | PRN

## 2012-10-09 SURGICAL SUPPLY — 41 items
ADH SKN CLS APL DERMABOND .7 (GAUZE/BANDAGES/DRESSINGS) ×1
BLADE SURG 10 STRL SS (BLADE) ×2 IMPLANT
BLADE SURG 15 STRL LF DISP TIS (BLADE) ×1 IMPLANT
BLADE SURG 15 STRL SS (BLADE) ×2
CANISTER SUCTION 1200CC (MISCELLANEOUS) ×2 IMPLANT
CHLORAPREP W/TINT 26ML (MISCELLANEOUS) ×2 IMPLANT
CLIP TI WIDE RED SMALL 6 (CLIP) ×1 IMPLANT
CLOTH BEACON ORANGE TIMEOUT ST (SAFETY) ×2 IMPLANT
COVER MAYO STAND STRL (DRAPES) ×2 IMPLANT
COVER TABLE BACK 60X90 (DRAPES) ×2 IMPLANT
DECANTER SPIKE VIAL GLASS SM (MISCELLANEOUS) ×1 IMPLANT
DERMABOND ADVANCED (GAUZE/BANDAGES/DRESSINGS) ×1
DERMABOND ADVANCED .7 DNX12 (GAUZE/BANDAGES/DRESSINGS) ×1 IMPLANT
DEVICE DUBIN W/COMP PLATE 8390 (MISCELLANEOUS) ×1 IMPLANT
DRAPE LAPAROSCOPIC ABDOMINAL (DRAPES) ×2 IMPLANT
DRAPE UTILITY XL STRL (DRAPES) ×2 IMPLANT
ELECT COATED BLADE 2.86 ST (ELECTRODE) ×2 IMPLANT
ELECT REM PT RETURN 9FT ADLT (ELECTROSURGICAL) ×2
ELECTRODE REM PT RTRN 9FT ADLT (ELECTROSURGICAL) ×1 IMPLANT
GLOVE BIO SURGEON STRL SZ7.5 (GLOVE) ×3 IMPLANT
GLOVE ECLIPSE 6.5 STRL STRAW (GLOVE) ×2 IMPLANT
GOWN PREVENTION PLUS XLARGE (GOWN DISPOSABLE) ×2 IMPLANT
KIT MARKER MARGIN INK (KITS) ×1 IMPLANT
NDL HYPO 25X1 1.5 SAFETY (NEEDLE) ×1 IMPLANT
NEEDLE HYPO 25X1 1.5 SAFETY (NEEDLE) ×2 IMPLANT
NS IRRIG 1000ML POUR BTL (IV SOLUTION) ×2 IMPLANT
PACK BASIN DAY SURGERY FS (CUSTOM PROCEDURE TRAY) ×2 IMPLANT
PENCIL BUTTON HOLSTER BLD 10FT (ELECTRODE) ×2 IMPLANT
SLEEVE SCD COMPRESS KNEE MED (MISCELLANEOUS) ×2 IMPLANT
SPONGE LAP 18X18 X RAY DECT (DISPOSABLE) ×2 IMPLANT
STAPLER VISISTAT 35W (STAPLE) IMPLANT
SUT MON AB 4-0 PC3 18 (SUTURE) ×2 IMPLANT
SUT SILK 2 0 SH (SUTURE) ×2 IMPLANT
SUT VIC AB 3-0 54X BRD REEL (SUTURE) IMPLANT
SUT VIC AB 3-0 BRD 54 (SUTURE)
SUT VICRYL 3-0 CR8 SH (SUTURE) ×2 IMPLANT
SYR CONTROL 10ML LL (SYRINGE) ×2 IMPLANT
TOWEL OR 17X24 6PK STRL BLUE (TOWEL DISPOSABLE) ×2 IMPLANT
TOWEL OR NON WOVEN STRL DISP B (DISPOSABLE) ×2 IMPLANT
TUBE CONNECTING 20X1/4 (TUBING) ×2 IMPLANT
YANKAUER SUCT BULB TIP NO VENT (SUCTIONS) ×2 IMPLANT

## 2012-10-09 NOTE — Transfer of Care (Signed)
Immediate Anesthesia Transfer of Care Note  [name]  Procedure(s) Performed: Procedure(s) with comments: BREAST LUMPECTOMY WITH NEEDLE LOCALIZATION (Left) - needle localization at SOLIS 7:30   Patient Location: PACU  Anesthesia Type:General  Level of Consciousness: awake, alert  and oriented  Airway & Oxygen Therapy: Patient Spontanous Breathing, oxygen by face mask  Post-op Assessment: Report given to PACU RN, Post -op Vital signs reviewed and stable and Patient moving all extremities  Post vital signs: Reviewed and stable  Complications: No apparent anesthesia complications

## 2012-10-09 NOTE — Interval H&P Note (Signed)
History and Physical Interval Note:  10/09/2012 8:40 AM  Becky Montoya  has presented today for surgery, with the diagnosis of left breast DCIS   The various methods of treatment have been discussed with the patient and family. After consideration of risks, benefits and other options for treatment, the patient has consented to  Procedure(s) with comments: BREAST LUMPECTOMY WITH NEEDLE LOCALIZATION (Left) - needle localization at SOLIS 7:30  as a surgical intervention .  The patient's history has been reviewed, patient examined, no change in status, stable for surgery.  I have reviewed the patient's chart and labs.  Questions were answered to the patient's satisfaction.     TOTH III,Luva Metzger S

## 2012-10-09 NOTE — Anesthesia Postprocedure Evaluation (Signed)
  Anesthesia Post-op Note  [name]  Procedure(s) Performed: Procedure(s) with comments: BREAST LUMPECTOMY WITH NEEDLE LOCALIZATION (Left) - needle localization at SOLIS 7:30   Patient Location: PACU  Anesthesia Type:General  Level of Consciousness: awake, alert  and oriented  Airway and Oxygen Therapy: Patient Spontanous Breathing  Post-op Pain: mild  Post-op Assessment: Post-op Vital signs reviewed  Post-op Vital Signs: Reviewed  Complications: No apparent anesthesia complications

## 2012-10-09 NOTE — Anesthesia Procedure Notes (Signed)
Procedure Name: LMA Insertion Date/Time: 10/09/2012 9:13 AM Performed by: Meyer Russel Pre-anesthesia Checklist: Patient identified, Emergency Drugs available, Suction available and Patient being monitored Patient Re-evaluated:Patient Re-evaluated prior to inductionOxygen Delivery Method: Circle System Utilized Preoxygenation: Pre-oxygenation with 100% oxygen Intubation Type: IV induction Ventilation: Mask ventilation without difficulty LMA: LMA inserted LMA Size: 4.0 Number of attempts: 1 Airway Equipment and Method: bite block Placement Confirmation: positive ETCO2 and breath sounds checked- equal and bilateral Tube secured with: Tape Dental Injury: Teeth and Oropharynx as per pre-operative assessment

## 2012-10-09 NOTE — H&P (View-Only) (Signed)
Subjective:     Patient ID: Becky Montoya, female   DOB: 1926/06/19, 79 y.o.   MRN: [id number]  HPI We are asked to see the patient in consultation by Dr. Tilda Burrow to evaluate her for her left breast cancer. The patient is an 79 year old white female who recently went for a routine screening mammogram. At that time she was found to have some abnormal calcifications in the left breast. The area measured 1.7 cm in diameter. This area was biopsied and came back as high-grade ductal carcinoma in situ. She was ER and PR positive. She denied any breast pain. She denied any discharge from her nipple. She only notes that she has had some fibrocystic disease of the breasts in the past.  Review of Systems  Constitutional: Negative.   HENT: Negative.   Eyes: Negative.   Respiratory: Negative.   Cardiovascular: Negative.   Gastrointestinal: Negative.   Endocrine: Negative.   Genitourinary: Negative.   Musculoskeletal: Negative.   Skin: Negative.   Allergic/Immunologic: Negative.   Neurological: Negative.   Hematological: Negative.   Psychiatric/Behavioral: Negative.        Objective:   Physical Exam  Constitutional: She is oriented to person, place, and time. She appears well-developed and well-nourished.  HENT:  Head: Normocephalic and atraumatic.  Eyes: Conjunctivae and EOM are normal. Pupils are equal, round, and reactive to light.  Neck: Normal range of motion. Neck supple.  Cardiovascular: Normal rate, regular rhythm and normal heart sounds.   Pulmonary/Chest: Effort normal and breath sounds normal.  There is no palpable mass in either breast. There is no palpable axillary supraclavicular or cervical lymphadenopathy.  Abdominal: Soft. Bowel sounds are normal. She exhibits no mass. There is no tenderness.  Musculoskeletal: Normal range of motion.  Lymphadenopathy:    She has no cervical adenopathy.  Neurological: She is alert and oriented to person, place, and time.  Skin: Skin is  warm and dry.  Psychiatric: She has a normal mood and affect. Her behavior is normal.       Assessment:     The patient appears to have a small area of DCIS in the upper outer quadrant of the left breast. I have discussed with her in detail the options for surgical treatment. At this point she favors breast conservation I think this is a reasonable option for her. She will not need a node evaluation. I've discussed with her in detail the risks and benefits of the operation as well as some of the technical aspects and she understands and wishes to proceed     Plan:     Plan for left breast wire localized lumpectomy

## 2012-10-09 NOTE — Anesthesia Preprocedure Evaluation (Signed)
Anesthesia Evaluation  Patient identified by MRN, date of birth, ID band Patient awake    Reviewed: Allergy & Precautions, H&P , NPO status , Patient's Chart, lab work & pertinent test results  Airway Mallampati: II  Neck ROM: full    Dental   Pulmonary          Cardiovascular hypertension,     Neuro/Psych    GI/Hepatic   Endo/Other  Hypothyroidism   Renal/GU      Musculoskeletal  (+) Arthritis -,   Abdominal   Peds  Hematology   Anesthesia Other Findings   Reproductive/Obstetrics                           Anesthesia Physical Anesthesia Plan  ASA: II  Anesthesia Plan: General   Post-op Pain Management:    Induction: Intravenous  Airway Management Planned: LMA  Additional Equipment:   Intra-op Plan:   Post-operative Plan:   Informed Consent: I have reviewed the patients History and Physical, chart, labs and discussed the procedure including the risks, benefits and alternatives for the proposed anesthesia with the patient or authorized representative who has indicated his/her understanding and acceptance.     Plan Discussed with: CRNA, Anesthesiologist and Surgeon  Anesthesia Plan Comments:         Anesthesia Quick Evaluation

## 2012-10-09 NOTE — Op Note (Signed)
10/09/2012  9:59 AM  PATIENT:  Becky Montoya  79 y.o. female  PRE-OPERATIVE DIAGNOSIS:  left breast DCIS   POST-OPERATIVE DIAGNOSIS:  left breast DCIS   PROCEDURE:  Procedure(s) with comments: BREAST LUMPECTOMY WITH NEEDLE LOCALIZATION (Left) - needle localization at SOLIS 7:30   SURGEON:  Surgeon(s) and Role:    * [name], MD - Primary  PHYSICIAN ASSISTANT:   ASSISTANTS: none   ANESTHESIA:   general  EBL:  Total I/O In: 800 [I.V.:800] Out: -   BLOOD ADMINISTERED:none  DRAINS: none   LOCAL MEDICATIONS USED:  MARCAINE     SPECIMEN:  Source of Specimen:  left breast tissue  DISPOSITION OF SPECIMEN:  PATHOLOGY  COUNTS:  YES  TOURNIQUET:  * No tourniquets in log *  DICTATION: .Dragon Dictation After informed consent was obtained the patient was prepped with Afrin were placed in the supine position on the operating room table. After adequate induction of general anesthesia the patient's left breast was prepped with ChloraPrep, allowed to dry, and draped in usual sterile manner. Earlier in the day the patient underwent a wire localization procedure and the wire was entering the left breast in the upper outer quadrant headed inferiorly. An incision was made with a 10 blade knife just beneath the wire entry site in the upper outer left breast. This incision was carried through the skin and subcutaneous tissue sharply with electrocautery. Once we were into the breast tissues the path of the wire could be palpated. A circular portion of breast tissue was excised sharply around the path of the wire. This dissection was carried all the way to the chest wall. Once the specimen was removed it was oriented according to the assigned paint colors. A specimen radiograph was obtained that showed the clip and the wire to be in the center of the specimen. The specimen was then sent to pathology for further evaluation. Hemostasis was achieved using the Bovie electrocautery. The wound was  infiltrated with quarter percent Marcaine and irrigated with copious amounts of saline. The deep layer the wound was then closed with interrupted 3-0 Vicryl stitches. The skin was then closed with interrupted 4-0 Vicryl subcuticular stitches. Dermabond dressings were applied. The patient tolerated the procedure well. At the end of the case all needle sponge and instrument counts were correct. The patient was then awakened and taken to recovery in stable condition.  PLAN OF CARE: Discharge to home after PACU  PATIENT DISPOSITION:  PACU - hemodynamically stable.   Delay start of Pharmacological VTE agent (>24hrs) due to surgical blood loss or risk of bleeding: not applicable

## 2012-10-13 ENCOUNTER — Encounter (HOSPITAL_BASED_OUTPATIENT_CLINIC_OR_DEPARTMENT_OTHER): Payer: Self-pay | Admitting: General Surgery

## 2012-10-15 ENCOUNTER — Encounter (INDEPENDENT_AMBULATORY_CARE_PROVIDER_SITE_OTHER): Payer: Self-pay

## 2012-10-15 ENCOUNTER — Telehealth (INDEPENDENT_AMBULATORY_CARE_PROVIDER_SITE_OTHER): Payer: Self-pay | Admitting: *Deleted

## 2012-10-15 NOTE — Telephone Encounter (Signed)
Patient called regarding surg path results.  Patient given Carolynne Edouard MD note that states margins are clear.

## 2012-10-21 ENCOUNTER — Encounter: Payer: Self-pay | Admitting: *Deleted

## 2012-10-21 NOTE — Progress Notes (Signed)
Mailed after appt letter to pt. 

## 2012-10-27 ENCOUNTER — Ambulatory Visit (INDEPENDENT_AMBULATORY_CARE_PROVIDER_SITE_OTHER): Payer: Medicare PPO | Admitting: General Surgery

## 2012-10-27 ENCOUNTER — Encounter (INDEPENDENT_AMBULATORY_CARE_PROVIDER_SITE_OTHER): Payer: Self-pay | Admitting: General Surgery

## 2012-10-27 VITALS — BP 110/64 | HR 68 | Temp 98.2°F | Resp 15 | Ht 63.0 in | Wt 135.2 lb

## 2012-10-27 DIAGNOSIS — D0512 Intraductal carcinoma in situ of left breast: Secondary | ICD-10-CM

## 2012-10-27 DIAGNOSIS — D0510 Intraductal carcinoma in situ of unspecified breast: Secondary | ICD-10-CM | POA: Insufficient documentation

## 2012-10-27 DIAGNOSIS — D0590 Unspecified type of carcinoma in situ of unspecified breast: Secondary | ICD-10-CM

## 2012-10-27 NOTE — Patient Instructions (Signed)
Continue regular self exams  

## 2012-10-27 NOTE — Progress Notes (Signed)
Subjective:     Patient ID: Becky Montoya, female   DOB: 1926-05-22, 79 y.o.   MRN: [id number]  HPI The patient is a 79 year old white female who is 2 weeks status post left breast lumpectomy for DCIS. She was ER and PR positive. She tolerated the surgery well. She denies any breast pain. Her main complaint is of a sore throat that started this past Thursday.  Review of Systems     Objective:   Physical Exam On exam her left breast incision is healing nicely with no significant seroma or infection    Assessment:     The patient is 2 weeks status post left breast lumpectomy for DCIS     Plan:     At this point she has an appointment next week with the medical oncologist to consider antiestrogen therapy. She will continue to do regular self exams. We will plan to see her back in 3 months.

## 2012-10-29 ENCOUNTER — Ambulatory Visit (HOSPITAL_BASED_OUTPATIENT_CLINIC_OR_DEPARTMENT_OTHER): Payer: Medicare PPO | Admitting: Oncology

## 2012-10-29 ENCOUNTER — Telehealth: Payer: Self-pay | Admitting: *Deleted

## 2012-10-29 VITALS — BP 122/71 | HR 87 | Temp 98.8°F | Resp 20 | Ht 63.0 in | Wt 134.9 lb

## 2012-10-29 DIAGNOSIS — C50419 Malignant neoplasm of upper-outer quadrant of unspecified female breast: Secondary | ICD-10-CM

## 2012-10-29 MED ORDER — TAMOXIFEN CITRATE 20 MG PO TABS: 20.0000 mg | ORAL_TABLET | Freq: Every day | ORAL | Status: DC

## 2012-10-29 NOTE — Progress Notes (Signed)
ID: Becky Montoya OB: 07/15/1926  MR#: [id number]  NWG#:[id number]  PCP: [name], MD GYN:   SUFelicity Pellegrini OTHER MD: Chipper Herb, Rick Cornella   HISTORY OF PRESENT ILLNESS: Kasyn had routine screening mammography at [facility] 09/03/2012. There was a possible abnormality noted in the left breast and on 09/05/2012 she underwent left diagnostic mammography which confirmed an area of pleomorphic microcalcifications in the upper outer quadrant of the left breast. Biopsy of this area 09/10/2012 showed ([id number]) ductal carcinoma in situ, grade 3, estrogen receptor positive at 100%, progesterone receptor positive at 6%. Bilateral breast MRI 09/16/2012 showed a 2.9 cm area of clumped linear enhancement in the upper outer quadrant of the left breast but no additional areas of concern and no evidence of adenopathy. Incidental hepatic cysts or hemangiomas were noted in the liver.  The patient's subsequent history is as detailed below  INTERVAL HISTORY: Becky Montoya returns today accompanied by her husband Roe Coombs for followup of her breast cancer. Since her last visit here she had her left lumpectomy, which showed no evidence of invasive disease.  REVIEW OF SYSTEMS: She did well with her surgery, which did not cause or unusual pain, fever, or swelling. There was no unusual bleeding. Since then though she's developed a little bit of a cough productive of clear to slightly grayish phlegm. She has a little bit of post nasal drainage which is the cause of the cough by her account. This causes her to not sleep well which is making her little bit tired. In addition she has a little bit of hoarseness and sore throat, but no fever. She has chronic joint pains involving both shoulders, left greater than right, low back, but also sometimes her hands and wrists. A detailed review of systems today was otherwise noncontributory  PAST MEDICAL HISTORY: Past Medical History  Diagnosis Date  . Hypertension   . Thyroid  disease   . Hyperlipidemia   . Osteoporosis   . Breast cancer   . Arthritis     PAST SURGICAL HISTORY: Past Surgical History  Procedure Laterality Date  . Fissures    . Abdominal hysterectomy    . Mouth surgery    . Appendectomy    . Colonoscopy    . Breast lumpectomy with needle localization Left 10/09/2012    Procedure: BREAST LUMPECTOMY WITH NEEDLE LOCALIZATION;  Surgeon: [name], MD;  Location: [facility];  Service: General;  Laterality: Left;  needle localization at SOLIS 7:30     FAMILY HISTORY Family History  Problem Relation Age of Onset  . Breast cancer Sister     dx in her 55s  . Cancer Maternal Grandmother     unknown form of cancer  . Heart attack Maternal Grandfather   . Cancer Cousin     maternal cousin with unknown form of cancer   the patient's father died from a stroke a bit of 20. The patient's mother died from a heart attack at the age of 59. The patient had one brother and 5 sisters. The patient has one sister with a history of breast cancer diagnosed at age 41 and the patient's mother's mother was diagnosed with some kind of cancer at the age of 31. There is no history of ovarian cancer in the family to the patient's knowledge.  GYNECOLOGIC HISTORY:  Menarche age 89, she is GX P3, with her first live birth at age 27. She underwent hysterectomy with unilateral salpingo-oophorectomy in 1973. She never used  birth control.  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 Her husband W. Lyndall Bellot, is a retired Journalist, newspaper. Daughter Becky Montoya lives in [place] and works as a Research scientist (physical sciences). Daughter Becky Montoya lives in [place] and work for [employer]. The patient had a son who did not survive. She has 4 grandchildren. She attends a [employer].    ADVANCED DIRECTIVES:  These are in place and the patient's daughter Noreene Larsson is her healthcare power of attorney. Noreene Larsson can be reached at  [phone].   HEALTH MAINTENANCE: History  Substance Use Topics  . Smoking status: Never Smoker   . Smokeless tobacco: Never Used  . Alcohol Use: No     Colonoscopy: January 2011  PAP:  Bone density: July 2013?  Lipid panel:  Allergies  Allergen Reactions  . Wellbutrin (Bupropion) Other (See Comments)    Nervousness    Current Outpatient Prescriptions  Medication Sig Dispense Refill  . aspirin EC 81 MG tablet Take 81 mg by mouth every morning.      . Calcium-Vitamin D (CALTRATE 600 PLUS-VIT D PO) Take by mouth daily.      Marland Kitchen denosumab (PROLIA) 60 MG/ML SOLN injection Inject 60 mg into the skin every 6 (six) months. Administer in upper arm, thigh, or abdomen      . HYDROcodone-acetaminophen (NORCO) 5-325 MG per tablet Take 1-2 tablets by mouth every 6 (six) hours as needed for pain.  30 tablet  1  . KRILL OIL PO Take by mouth daily.      Marland Kitchen levothyroxine (SYNTHROID, LEVOTHROID) 50 MCG tablet Take 50 mcg by mouth every morning.      Marland Kitchen LORazepam (ATIVAN) 0.5 MG tablet Take 0.5 mg by mouth every 8 (eight) hours as needed for anxiety.      Marland Kitchen losartan-hydrochlorothiazide (HYZAAR) 100-12.5 MG per tablet Take 1 tablet by mouth every morning.      . Misc Natural Products (LUTEIN 20) CAPS Take 1 capsule by mouth every morning.      . Multiple Vitamin (MULTIVITAMIN) LIQD Take 30 mLs by mouth every morning.       . naproxen sodium (ANAPROX) 220 MG tablet Take 220 mg by mouth as needed.      . NON FORMULARY 2 oz at bedtime. artho bonejoint      . simvastatin (ZOCOR) 40 MG tablet Take 40 mg by mouth every evening.      . vitamin B-12 (CYANOCOBALAMIN) 1000 MCG tablet Take 1,000 mcg by mouth daily.      . vitamin E 400 UNIT capsule Take 400 Units by mouth every evening.       No current facility-administered medications for this visit.    OBJECTIVE: Elderly white woman who looks exhausted Filed Vitals:   10/29/12 1433  BP: 122/71  Pulse: 87  Temp: 98.8 F (37.1 C)  Resp: 20     Body  mass index is 23.9 kg/(m^2).    ECOG FS: 2  Sclerae unicteric Oropharynx clear No cervical or supraclavicular adenopathy Lungs no rales or rhonchi Heart regular rate and rhythm Abd benign MSK no focal spinal tenderness, no peripheral edema Neuro: non-focal, well-oriented, appropriate affect Breasts: The right breast is unremarkable. The left breast is status post lumpectomy. The cosmetic result is excellent. The wound is healing nicely, without erythema, swelling, or dehiscence. The left axilla is benign  LAB RESULTS:  CMP     Component Value Date/Time   NA 137  10/08/2012 1254   NA 139 09/17/2012 0815   K 3.7 10/08/2012 1254   K 3.4* 09/17/2012 0815   CL 102 10/08/2012 1254   CL 106 09/17/2012 0815   CO2 25 10/08/2012 1254   CO2 22 09/17/2012 0815   GLUCOSE 108* 10/08/2012 1254   GLUCOSE 100* 09/17/2012 0815   BUN 15 10/08/2012 1254   BUN 15.1 09/17/2012 0815   CREATININE 0.74 10/08/2012 1254   CREATININE 0.8 09/17/2012 0815   CALCIUM 9.1 10/08/2012 1254   CALCIUM 9.4 09/17/2012 0815   PROT 6.8 09/17/2012 0815   ALBUMIN 3.8 09/17/2012 0815   AST 21 09/17/2012 0815   ALT 18 09/17/2012 0815   ALKPHOS 76 09/17/2012 0815   BILITOT 0.62 09/17/2012 0815   GFRNONAA 76* 10/08/2012 1254   GFRAA 88* 10/08/2012 1254    I No results found for this basename: SPEP,  UPEP,   kappa and lambda light chains    Lab Results  Component Value Date   WBC 5.3 09/17/2012   NEUTROABS 3.1 09/17/2012   HGB 14.3 10/09/2012   HCT 39.5 09/17/2012   MCV 91.2 09/17/2012   PLT 145 09/17/2012      Chemistry      Component Value Date/Time   NA 137 10/08/2012 1254   NA 139 09/17/2012 0815   K 3.7 10/08/2012 1254   K 3.4* 09/17/2012 0815   CL 102 10/08/2012 1254   CL 106 09/17/2012 0815   CO2 25 10/08/2012 1254   CO2 22 09/17/2012 0815   BUN 15 10/08/2012 1254   BUN 15.1 09/17/2012 0815   CREATININE 0.74 10/08/2012 1254   CREATININE 0.8 09/17/2012 0815      Component Value Date/Time   CALCIUM 9.1 10/08/2012 1254   CALCIUM 9.4 09/17/2012  0815   ALKPHOS 76 09/17/2012 0815   AST 21 09/17/2012 0815   ALT 18 09/17/2012 0815   BILITOT 0.62 09/17/2012 0815       No results found for this basename: LABCA2    No components found with this basename: LABCA125    No results found for this basename: INR,  in the last 168 hours  Urinalysis No results found for this basename: colorurine,  appearanceur,  labspec,  phurine,  glucoseu,  hgbur,  bilirubinur,  ketonesur,  proteinur,  urobilinogen,  nitrite,  leukocytesur    STUDIES: Mr Breast Bilateral W Wo Contrast  09/17/2012   *RADIOLOGY REPORT*  Clinical Data: Recently diagnosed left breast ductal carcinoma in situ.  BUN and creatinine were obtained on site at [facility] at [address] Results:  BUN 15 mg/dL,  Creatinine 0.8 mg/dL.  BILATERAL BREAST MRI WITH AND WITHOUT CONTRAST  Technique: Multiplanar, multisequence MR images of both breasts were obtained prior to and following the intravenous administration of 12ml of MultiHance.  Three dimensional images were evaluated at the independent DynaCad workstation.  Comparison:  Recent mammograms and biopsy at [facility].  Findings: Minimal background parenchymal enhancement in both breasts.  2.9 x 0.8 x 0.6 cm vertically oriented area of clumped linear enhancement in the upper outer quadrant of the left breast in the middle third.  This contains a biopsy marker clip artifact inferiorly.  No additional masses or areas of enhancement suspicious for malignancy in either breast.  No abnormal appearing lymph nodes.  Multiple rounded, smoothly marginated, homogeneously hyperintense masses in the liver on the inversion recovery and T2-weighted images.  No definite enhancement of these masses seen post contrast, limited by cardiac  motion artifacts.  IMPRESSION:  1.  2.9 x 0.8 x 0.6 cm biopsy-proven ductal carcinoma in situ in the upper outer quadrant of the left breast. 2.  No evidence of malignancy elsewhere in either breast and  no adenopathy. 3.  Multiple probable cysts or hemangiomas in the liver.  RECOMMENDATION: Treatment plan  THREE-DIMENSIONAL MR IMAGE RENDERING ON INDEPENDENT WORKSTATION:  Three-dimensional MR images were rendered by post-processing of the original MR data on an independent workstation.  The three- dimensional MR images were interpreted, and findings were reported in the accompanying complete MRI report for this study.  BI-RADS CATEGORY 6:  Known biopsy-proven malignancy - appropriate action should be taken.   Original Report Authenticated By: Beckie Salts, M.D.    ASSESSMENT: 79 y.o. [place] woman status post left upper outer quadrant lumpectomy 10/08/2012 for a 0.6 cm ductal carcinoma in situ, grade 2, estrogen receptor positive at 100%, progesterone receptor positive at 6%, with ample margins  PLAN: We discussed her surgical results and her prognosis in detail. She understands her tumor was noninvasive, and therefore not life threatening. It turned out to be grade 2 rather than grade 3. Her margins are excellent. For all those reasons I think her risk of local recurrence is on the low side. Nevertheless she can cut that risk in half by receiving radiation,, taking antiestrogen is, or doing both, which would then cut that risk to a 4th.  After much discussion was she decided to do is try anti-estrogens. We then discussed the difference between aromatase inhibitors and tamoxifen and given her problems with osteoporosis organ negative tamoxifen to try. Note that she took estrogen for many years with no complications regarding blood clots.  She is going to started tamoxifen August 1, once she is recovered from her surgery, she will see [place] again in mid to late September. If she is tolerating tamoxifen well, we will start seeing her on a once a year basis at that point. Otherwise we will stop the tamoxifen and refer her back to Dr. Dayton Scrape for consideration of radiation treatments  As far as her cough is  concerned she will drink liquids, take Claritin daily for 2 weeks, and call me if it persists after that.  Lowella Dell, MD   10/29/2012 2:52 PM

## 2012-10-29 NOTE — Telephone Encounter (Signed)
appts made and printed...td 

## 2012-10-30 NOTE — Addendum Note (Signed)
Addended by: Billey Co on: 10/30/2012 04:16 PM   Modules accepted: Orders

## 2012-12-01 ENCOUNTER — Telehealth (INDEPENDENT_AMBULATORY_CARE_PROVIDER_SITE_OTHER): Payer: Self-pay | Admitting: General Surgery

## 2012-12-01 NOTE — Telephone Encounter (Signed)
Pt called to ask if OK to for her to play a small accordion now.  She had lumpectomy surgery on 10/09/12 and is feeling great.  Pt advised to begin playing and if she encounters pain to stop.  If she feels soreness, proceed slowly and continue for 2-3 minutes; if pain worsens or continues then stop.  Otherwise ok to play.

## 2012-12-17 ENCOUNTER — Encounter (INDEPENDENT_AMBULATORY_CARE_PROVIDER_SITE_OTHER): Payer: Self-pay | Admitting: General Surgery

## 2012-12-19 ENCOUNTER — Ambulatory Visit (INDEPENDENT_AMBULATORY_CARE_PROVIDER_SITE_OTHER): Payer: Medicare PPO | Admitting: General Surgery

## 2012-12-19 ENCOUNTER — Encounter (INDEPENDENT_AMBULATORY_CARE_PROVIDER_SITE_OTHER): Payer: Self-pay | Admitting: General Surgery

## 2012-12-19 VITALS — BP 112/58 | HR 80 | Temp 97.8°F | Resp 15 | Ht 63.0 in | Wt 136.6 lb

## 2012-12-19 DIAGNOSIS — N61 Mastitis without abscess: Secondary | ICD-10-CM

## 2012-12-19 MED ORDER — DOXYCYCLINE HYCLATE 100 MG PO TABS: 100.0000 mg | ORAL_TABLET | Freq: Two times a day (BID) | ORAL | Status: DC

## 2012-12-19 NOTE — Progress Notes (Signed)
Patient ID: Becky Montoya, female   DOB: 1926/11/08, 79 y.o.   MRN: [id number]  Chief Complaint  Patient presents with  . Follow-up    hx of br ca/new redness on left br    HPI Becky Montoya is a 79 y.o. female.   HPI  She presents to the urgent office today complaining of redness to the left breast it's been going on for about 2 weeks. Denies fever or chills. She had a left breast lumpectomy care for DCIS back in July of 2014 by [name].  Past Medical History  Diagnosis Date  . Hypertension   . Thyroid disease   . Hyperlipidemia   . Osteoporosis   . Breast cancer   . Arthritis     Past Surgical History  Procedure Laterality Date  . Fissures    . Abdominal hysterectomy    . Mouth surgery    . Appendectomy    . Colonoscopy    . Breast lumpectomy with needle localization Left 10/09/2012    Procedure: BREAST LUMPECTOMY WITH NEEDLE LOCALIZATION;  Surgeon: [name], MD;  Location: [facility];  Service: General;  Laterality: Left;  needle localization at SOLIS 7:30     Family History  Problem Relation Age of Onset  . Breast cancer Sister     dx in her 74s  . Cancer Maternal Grandmother     unknown form of cancer  . Heart attack Maternal Grandfather   . Cancer Cousin     maternal cousin with unknown form of cancer    Social History History  Substance Use Topics  . Smoking status: Never Smoker   . Smokeless tobacco: Never Used  . Alcohol Use: No    Allergies  Allergen Reactions  . Wellbutrin [Bupropion] Other (See Comments)    Nervousness    Current Outpatient Prescriptions  Medication Sig Dispense Refill  . aspirin EC 81 MG tablet Take 81 mg by mouth every morning.      . Calcium-Vitamin D (CALTRATE 600 PLUS-VIT D PO) Take by mouth daily.      Marland Kitchen denosumab (PROLIA) 60 MG/ML SOLN injection Inject 60 mg into the skin every 6 (six) months. Administer in upper arm, thigh, or abdomen      . HYDROcodone-acetaminophen (NORCO/VICODIN) 5-325 MG per  tablet       . KRILL OIL PO Take by mouth daily.      Marland Kitchen levothyroxine (SYNTHROID, LEVOTHROID) 50 MCG tablet Take 50 mcg by mouth every morning.      Marland Kitchen LORazepam (ATIVAN) 0.5 MG tablet Take 0.5 mg by mouth every 8 (eight) hours as needed for anxiety.      Marland Kitchen losartan-hydrochlorothiazide (HYZAAR) 100-12.5 MG per tablet Take 1 tablet by mouth every morning.      . Misc Natural Products (LUTEIN 20) CAPS Take 1 capsule by mouth every morning.      . Multiple Vitamin (MULTIVITAMIN) LIQD Take 30 mLs by mouth every morning.       . NON FORMULARY 2 oz at bedtime. artho bonejoint      . simvastatin (ZOCOR) 40 MG tablet Take 40 mg by mouth every evening.      . tamoxifen (NOLVADEX) 20 MG tablet Take 1 tablet (20 mg total) by mouth daily.  90 tablet  12  . vitamin B-12 (CYANOCOBALAMIN) 1000 MCG tablet Take 1,000 mcg by mouth daily.      . vitamin E 400 UNIT capsule Take 400 Units by mouth every  evening.      Marland Kitchen doxycycline (VIBRA-TABS) 100 MG tablet Take 1 tablet (100 mg total) by mouth 2 (two) times daily.  20 tablet  0   No current facility-administered medications for this visit.    Review of Systems Review of Systems  Constitutional: Negative for fever and chills.    Blood pressure 112/58, pulse 80, temperature 97.8 F (36.6 C), temperature source Temporal, resp. rate 15, height 5\' 3"  (1.6 m), weight 136 lb 9.6 oz (61.961 kg).  Physical Exam Physical Exam  Constitutional: She appears well-developed and well-nourished. No distress.  Pulmonary/Chest:  Left breast-upper-outer quadrant scar is present and cleaned. In the circumareolar area there is some erythema and induration present but no fluctuance.  Musculoskeletal:  No left axillary adenopathy.    Data Reviewed Dr. Billey Chang note  Assessment    Acute left mastitis.     Plan    Start doxycycline 100 mg twice a day. Followup with [name] next week. Call the office if the situation gets worse.        Kemyah Buser J 12/19/2012,  4:32 PM

## 2012-12-19 NOTE — Patient Instructions (Signed)
Call if the situation gets worse.

## 2012-12-22 ENCOUNTER — Encounter (INDEPENDENT_AMBULATORY_CARE_PROVIDER_SITE_OTHER): Payer: Self-pay | Admitting: General Surgery

## 2012-12-22 ENCOUNTER — Ambulatory Visit (INDEPENDENT_AMBULATORY_CARE_PROVIDER_SITE_OTHER): Payer: Medicare PPO | Admitting: General Surgery

## 2012-12-22 VITALS — BP 128/72 | HR 76 | Resp 14 | Ht 63.0 in | Wt 137.6 lb

## 2012-12-22 DIAGNOSIS — D0590 Unspecified type of carcinoma in situ of unspecified breast: Secondary | ICD-10-CM

## 2012-12-22 DIAGNOSIS — D0512 Intraductal carcinoma in situ of left breast: Secondary | ICD-10-CM

## 2012-12-22 NOTE — Progress Notes (Signed)
Subjective:     Patient ID: Becky Montoya, female   DOB: [birth year], 79 y.o.   MRN: [id number]  HPI The patient is an 79 year old white female who is 2 months status post left lumpectomy for DCIS of the left breast. She was ER and PR positive. She started tamoxifen on September 1. About a week and a half after this she developed some redness centered around her left nipple and  Areola. She was started on doxycycline Friday evening. She feels as though the redness has faded slightly since then. Her other complaint is that she has developed over the last several days some numbness of her left face. She has no slurring of her words. She has no face droop. She has no weakness of her arms or legs.  Review of Systems     Objective:   Physical Exam On exam her left breast incision is healing nicely with no sign of infection. The redness of the left breast is centered around the nipple and does not involve her incision at all. There is no palpable mass in either breast. There is no palpable axillary, cervical, or supraclavicular lymphadenopathy. She has no apparent weakness of her upper lower extremities. She has no facial droop.    Assessment:     The patient is 2 months status post left breast lumpectomy for DCIS     Plan:     At this point she will continue her 10 day course of doxycycline. I have also encouraged her to contact her primary medical doctor today to let them know about the numbness of her left face but she has no other signs of acute stroke. She will continue to take tamoxifen. She has a followup appointment in the next week or 2 with the medical oncologist. I will plan to see her back in about 2 weeks to check the redness.

## 2012-12-22 NOTE — Patient Instructions (Signed)
Continue doxycycline Continue tamoxifen Call Dr. Cliffton Asters to let her know about the numbness in face

## 2012-12-23 ENCOUNTER — Ambulatory Visit (INDEPENDENT_AMBULATORY_CARE_PROVIDER_SITE_OTHER): Payer: Medicare PPO | Admitting: General Surgery

## 2012-12-31 ENCOUNTER — Telehealth: Payer: Self-pay | Admitting: *Deleted

## 2012-12-31 ENCOUNTER — Encounter: Payer: Self-pay | Admitting: Physician Assistant

## 2012-12-31 ENCOUNTER — Ambulatory Visit (HOSPITAL_BASED_OUTPATIENT_CLINIC_OR_DEPARTMENT_OTHER): Payer: Medicare PPO | Admitting: Lab

## 2012-12-31 ENCOUNTER — Ambulatory Visit (HOSPITAL_BASED_OUTPATIENT_CLINIC_OR_DEPARTMENT_OTHER): Payer: Medicare PPO | Admitting: Physician Assistant

## 2012-12-31 VITALS — BP 173/79 | HR 83 | Temp 97.8°F | Resp 20 | Ht 63.0 in | Wt 136.2 lb

## 2012-12-31 DIAGNOSIS — D0590 Unspecified type of carcinoma in situ of unspecified breast: Secondary | ICD-10-CM

## 2012-12-31 DIAGNOSIS — N61 Mastitis without abscess: Secondary | ICD-10-CM

## 2012-12-31 DIAGNOSIS — C50412 Malignant neoplasm of upper-outer quadrant of left female breast: Secondary | ICD-10-CM

## 2012-12-31 DIAGNOSIS — Z853 Personal history of malignant neoplasm of breast: Secondary | ICD-10-CM

## 2012-12-31 DIAGNOSIS — I10 Essential (primary) hypertension: Secondary | ICD-10-CM

## 2012-12-31 DIAGNOSIS — Z170 Estrogen receptor positive status [ER+]: Secondary | ICD-10-CM

## 2012-12-31 LAB — COMPREHENSIVE METABOLIC PANEL (CC13)
ALT: 16 U/L (ref 0–55)
Albumin: 3.4 g/dL — ABNORMAL LOW (ref 3.5–5.0)
CO2: 24 mEq/L (ref 22–29)
Calcium: 9.8 mg/dL (ref 8.4–10.4)
Chloride: 108 mEq/L (ref 98–109)
Creatinine: 0.9 mg/dL (ref 0.6–1.1)
Sodium: 143 mEq/L (ref 136–145)
Total Protein: 6.3 g/dL — ABNORMAL LOW (ref 6.4–8.3)

## 2012-12-31 LAB — CBC WITH DIFFERENTIAL/PLATELET
BASO%: 0.8 % (ref 0.0–2.0)
HCT: 40.6 % (ref 34.8–46.6)
MCHC: 33.3 g/dL (ref 31.5–36.0)
MONO#: 0.5 10*3/uL (ref 0.1–0.9)
NEUT%: 62.4 % (ref 38.4–76.8)
WBC: 6.2 10*3/uL (ref 3.9–10.3)
lymph#: 1.6 10*3/uL (ref 0.9–3.3)

## 2012-12-31 MED ORDER — DOXYCYCLINE HYCLATE 100 MG PO TABS: 100.0000 mg | ORAL_TABLET | Freq: Two times a day (BID) | ORAL | Status: DC

## 2012-12-31 NOTE — Progress Notes (Signed)
ID: Becky Montoya OB: 1926/12/27  MR#: [id number]  CSN#:[id number]  PCP: [name], MD GYN:   SUFelicity Pellegrini OTHER MD: Chipper Herb, Rogelia Mire  CHIEF COMPLAINT:  Left Breast Cancer    HISTORY OF PRESENT ILLNESS: Becky Montoya had routine screening mammography at [facility] 09/03/2012. There was a possible abnormality noted in the left breast and on 09/05/2012 she underwent left diagnostic mammography which confirmed an area of pleomorphic microcalcifications in the upper outer quadrant of the left breast. Biopsy of this area 09/10/2012 showed ([id number]) ductal carcinoma in situ, grade 3, estrogen receptor positive at 100%, progesterone receptor positive at 6%. Bilateral breast MRI 09/16/2012 showed a 2.9 cm area of clumped linear enhancement in the upper outer quadrant of the left breast but no additional areas of concern and no evidence of adenopathy. Incidental hepatic cysts or hemangiomas were noted in the liver.  The patient's subsequent history is as detailed below  INTERVAL HISTORY: Becky Montoya returns today for routine followup of her left breast carcinoma. Since her appointment here in July, she has started on tamoxifen, 20 mg daily, which she is tolerating very well. Her only complaint is some decrease in energy and some mild anxiety.  Interval history is notable for the fact that Becky Montoya is currently being followed by her surgeon, [name], for a left mastitis. She just completed a course of doxycycline, and the redness on the breast is improving, but has not completely resolved. Fortunately, she's had no fevers or chills. The breast itself is not painful.  REVIEW OF SYSTEMS: Becky Montoya has no problems with hot flashes. She denies any skin changes or rashes, other than the mastitis as noted above. She's had no abnormal bruising or bleeding. She denies any history of blood clots. Her appetite is good and she denies any nausea or emesis and has had no change in bowel or bladder habits. She denies  any cough, phlegm production, chest pain, or palpitations. She does have some shortness of breath with exertion which is stable. She has some occasional postnasal drip. Becky Montoya denies any abnormal headaches, dizziness, or change in vision. She was recently evaluated by her primary care physician, Dr. Cliffton Asters, for some numbness on the left side of her face which is improving. Dr. Cliffton Asters will continue to follow this and has suggested an MRI if it does not resolve. Becky Montoya has diffuse joint pain she attributes to arthritis, but this is stable. She's had no peripheral swelling.   A detailed review of systems is otherwise stable and noncontributory.   PAST MEDICAL HISTORY: Past Medical History  Diagnosis Date  . Hypertension   . Thyroid disease   . Hyperlipidemia   . Osteoporosis   . Breast cancer   . Arthritis     PAST SURGICAL HISTORY: Past Surgical History  Procedure Laterality Date  . Fissures    . Abdominal hysterectomy    . Mouth surgery    . Appendectomy    . Colonoscopy    . Breast lumpectomy with needle localization Left 10/09/2012    Procedure: BREAST LUMPECTOMY WITH NEEDLE LOCALIZATION;  Surgeon: [name], MD;  Location: [facility];  Service: General;  Laterality: Left;  needle localization at SOLIS 7:30     FAMILY HISTORY Family History  Problem Relation Age of Onset  . Breast cancer Sister     dx in her 86s  . Cancer Maternal Grandmother     unknown form of cancer  . Heart attack Maternal Grandfather   .  Cancer Cousin     maternal cousin with unknown form of cancer   the patient's father died from a stroke a bit of 13. The patient's mother died from a heart attack at the age of 72. The patient had one brother and 5 sisters. The patient has one sister with a history of breast cancer diagnosed at age 56 and the patient's mother's mother was diagnosed with some kind of cancer at the age of 67. There is no history of ovarian cancer in the family to the patient's  knowledge.  GYNECOLOGIC HISTORY:  Menarche age 80, she is GX P3, with her first live birth at age 79. She underwent hysterectomy with unilateral salpingo-oophorectomy in 1973. She never used birth control.  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 Her husband Becky Montoya, is a retired Journalist, newspaper. Daughter Becky Montoya lives in [place] and works as a Research scientist (physical sciences). Daughter Becky Montoya lives in [place] and work for [employer]. The patient had a son who did not survive. She has 4 grandchildren and 2 great-grandchildren. She has a third great-grandchild (a girl) due in February 2015. She attends a [employer].    ADVANCED DIRECTIVES:  These are in place and the patient's daughter Becky Montoya is her healthcare power of attorney. Becky Montoya can be reached at [phone].   HEALTH MAINTENANCE: History  Substance Use Topics  . Smoking status: Never Smoker   . Smokeless tobacco: Never Used  . Alcohol Use: No     Colonoscopy: January 2011  PAP:  Bone density: July 2013?  Lipid panel: Feb 2014  Allergies  Allergen Reactions  . Wellbutrin [Bupropion] Other (See Comments)    Nervousness    Current Outpatient Prescriptions  Medication Sig Dispense Refill  . aspirin EC 81 MG tablet Take 81 mg by mouth every morning.      . Calcium-Vitamin D (CALTRATE 600 PLUS-VIT D PO) Take by mouth daily.      Marland Kitchen denosumab (PROLIA) 60 MG/ML SOLN injection Inject 60 mg into the skin every 6 (six) months. Administer in upper arm, thigh, or abdomen      . doxycycline (VIBRA-TABS) 100 MG tablet Take 1 tablet (100 mg total) by mouth 2 (two) times daily.  20 tablet  0  . KRILL OIL PO Take by mouth daily.      Marland Kitchen levothyroxine (SYNTHROID, LEVOTHROID) 50 MCG tablet Take 50 mcg by mouth every morning.      Marland Kitchen LORazepam (ATIVAN) 0.5 MG tablet Take 0.5 mg by mouth every 8 (eight) hours as needed for anxiety.      Marland Kitchen losartan-hydrochlorothiazide  (HYZAAR) 100-12.5 MG per tablet Take 1 tablet by mouth every morning.      . Misc Natural Products (LUTEIN 20) CAPS Take 1 capsule by mouth every morning.      . Multiple Vitamin (MULTIVITAMIN) LIQD Take 30 mLs by mouth every morning.       . NON FORMULARY 2 oz at bedtime. artho bonejoint      . simvastatin (ZOCOR) 40 MG tablet Take 40 mg by mouth every evening.      . tamoxifen (NOLVADEX) 20 MG tablet Take 1 tablet (20 mg total) by mouth daily.  90 tablet  12  . vitamin B-12 (CYANOCOBALAMIN) 1000 MCG tablet Take 1,000 mcg by mouth daily.      . vitamin E 400 UNIT capsule Take 400 Units by mouth every evening.  No current facility-administered medications for this visit.    OBJECTIVE: Elderly white woman who looks tired but is in no acute distress Filed Vitals:   12/31/12 1323  BP: 173/79  Pulse: 83  Temp: 97.8 F (36.6 C)  Resp: 20     Body mass index is 24.13 kg/(m^2).    ECOG FS: 1 Filed Weights   12/31/12 1323  Weight: 136 lb 3.2 oz (61.78 kg)   Physical Exam: HEENT:  Sclerae anicteric.  Oropharynx clear.  NODES:  No cervical or supraclavicular lymphadenopathy palpated.  BREAST EXAM: Right breast is unremarkable. Left breast is status post lumpectomy with well-healed incision. The breast itself is mildly erythematous, well demarcated in the central portion of the breast, blanching. There is no tenderness to touch and no warmth to touch. No evidence of drainage or discharge from the nipple, or the lumpectomy incision.  Axillae are benign bilaterally with no palpable adenopathy LUNGS:  Clear to auscultation bilaterally.  No wheezes or rhonchi HEART:  Regular rate and rhythm. No murmur  ABDOMEN:  Soft, nontender to palpation.  Positive bowel sounds.  MSK:  No focal spinal tenderness to palpation. Good range of motion in the upper extremities EXTREMITIES:  Nonpitting pedal edema, equal bilaterally. NEURO:  Nonfocal. Well oriented.  Pleasant affect.   LAB RESULTS:  CMP      Component Value Date/Time   NA 137 10/08/2012 1254   NA 139 09/17/2012 0815   K 3.7 10/08/2012 1254   K 3.4* 09/17/2012 0815   CL 102 10/08/2012 1254   CL 106 09/17/2012 0815   CO2 25 10/08/2012 1254   CO2 22 09/17/2012 0815   GLUCOSE 108* 10/08/2012 1254   GLUCOSE 100* 09/17/2012 0815   BUN 15 10/08/2012 1254   BUN 15.1 09/17/2012 0815   CREATININE 0.74 10/08/2012 1254   CREATININE 0.8 09/17/2012 0815   CALCIUM 9.1 10/08/2012 1254   CALCIUM 9.4 09/17/2012 0815   PROT 6.8 09/17/2012 0815   ALBUMIN 3.8 09/17/2012 0815   AST 21 09/17/2012 0815   ALT 18 09/17/2012 0815   ALKPHOS 76 09/17/2012 0815   BILITOT 0.62 09/17/2012 0815   GFRNONAA 76* 10/08/2012 1254   GFRAA 88* 10/08/2012 1254    I No results found for this basename: SPEP,  UPEP,   kappa and lambda light chains    Lab Results  Component Value Date   WBC 5.3 09/17/2012   NEUTROABS 3.1 09/17/2012   HGB 14.3 10/09/2012   HCT 39.5 09/17/2012   MCV 91.2 09/17/2012   PLT 145 09/17/2012      Chemistry      Component Value Date/Time   NA 137 10/08/2012 1254   NA 139 09/17/2012 0815   K 3.7 10/08/2012 1254   K 3.4* 09/17/2012 0815   CL 102 10/08/2012 1254   CL 106 09/17/2012 0815   CO2 25 10/08/2012 1254   CO2 22 09/17/2012 0815   BUN 15 10/08/2012 1254   BUN 15.1 09/17/2012 0815   CREATININE 0.74 10/08/2012 1254   CREATININE 0.8 09/17/2012 0815      Component Value Date/Time   CALCIUM 9.1 10/08/2012 1254   CALCIUM 9.4 09/17/2012 0815   ALKPHOS 76 09/17/2012 0815   AST 21 09/17/2012 0815   ALT 18 09/17/2012 0815   BILITOT 0.62 09/17/2012 0815       STUDIES:  No results found.   ASSESSMENT: 79 y.o. [place] woman   (1)  status post left upper outer quadrant lumpectomy 10/08/2012 for a  0.6 cm ductal carcinoma in situ, grade 2, estrogen receptor positive at 100%, progesterone receptor positive at 6%, with ample margins  (2)  started on tamoxifen in July 2014  (3) left mastitis, followed by [name], recently completing a course of  doxycycline.   PLAN: Roshanda is tolerating the tamoxifen very well, and will continue at 20 mg daily. I am also going to continue her doxycycline at 100 mg by mouth twice a day for another 10 days for the left mastitis/cellulitis. She scheduled to see [name] for followup in 2 weeks to reassess the left breast. She will call us or [name] prior to that visit should she notice any worsening of the left breast are certainly should she begins running fevers or have chills.  We are repeating standard labs today, including a CBC and metabolic panel. Freeda's next mammogram will be due in May and I will have that scheduled for her as well. Otherwise, we will start seeing her on an annual basis, and will hopefully be able to alternate these appointments with [name] so that she is evaluated essentially every 6 months.  All this was reviewed in detail with [facility] today. She voices understanding and agreement with our plan, noticed called any changes or problems.   Chavela Justiniano, PA-C   12/31/2012 1:57 PM

## 2012-12-31 NOTE — Telephone Encounter (Signed)
appts made and printed. gv appt for [facility] 09/04/13@ 9:30am....td

## 2013-01-13 ENCOUNTER — Ambulatory Visit (INDEPENDENT_AMBULATORY_CARE_PROVIDER_SITE_OTHER): Payer: Medicare PPO | Admitting: General Surgery

## 2013-01-13 ENCOUNTER — Encounter (INDEPENDENT_AMBULATORY_CARE_PROVIDER_SITE_OTHER): Payer: Self-pay | Admitting: General Surgery

## 2013-01-13 VITALS — BP 104/66 | HR 70 | Temp 98.0°F | Resp 14 | Ht 63.0 in | Wt 136.4 lb

## 2013-01-13 DIAGNOSIS — C50412 Malignant neoplasm of upper-outer quadrant of left female breast: Secondary | ICD-10-CM

## 2013-01-13 DIAGNOSIS — C50419 Malignant neoplasm of upper-outer quadrant of unspecified female breast: Secondary | ICD-10-CM

## 2013-01-13 NOTE — Progress Notes (Signed)
Subjective:     Patient ID: ENVY MENO, female   DOB: 1927-03-14, 79 y.o.   MRN: [id number]  HPI The patient is an 79 year old white female who is 3 months status post left breast lumpectomy for DCIS. She is ER/PR positive. She is taking tamoxifen and seems to be tolerating that well although she has no energy. She has also had some redness centered around her nipple and areola on the left. This is starting to fade after 2 rounds of doxycycline.  Review of Systems     Objective:   Physical Exam On exam her left breast incision is healing nicely with no sign of infection or stroma. There is redness and around the nipple and areola but seems to be fading slightly from her previous visit     Assessment:     The patient is 3 months status post left breast lumpectomy for DCIS     Plan:     At this point she will continue to take tamoxifen. She will continue to do regular self exams. I will plan to see her back in one month to recheck her redness. If it does not continue to fade she may require a skin punch biopsy.

## 2013-01-13 NOTE — Patient Instructions (Signed)
Continue tamoxifen Call if redness worsens

## 2013-02-16 ENCOUNTER — Other Ambulatory Visit: Payer: Self-pay | Admitting: Family Medicine

## 2013-02-16 DIAGNOSIS — R0989 Other specified symptoms and signs involving the circulatory and respiratory systems: Secondary | ICD-10-CM

## 2013-02-18 ENCOUNTER — Ambulatory Visit (INDEPENDENT_AMBULATORY_CARE_PROVIDER_SITE_OTHER): Payer: Medicare PPO | Admitting: General Surgery

## 2013-02-18 ENCOUNTER — Encounter (INDEPENDENT_AMBULATORY_CARE_PROVIDER_SITE_OTHER): Payer: Self-pay | Admitting: General Surgery

## 2013-02-18 VITALS — BP 136/68 | HR 92 | Resp 16 | Ht 63.0 in | Wt 136.6 lb

## 2013-02-18 DIAGNOSIS — D0512 Intraductal carcinoma in situ of left breast: Secondary | ICD-10-CM

## 2013-02-18 DIAGNOSIS — D0590 Unspecified type of carcinoma in situ of unspecified breast: Secondary | ICD-10-CM

## 2013-02-18 NOTE — Progress Notes (Signed)
Subjective:     Patient ID: Becky Montoya, female   DOB: 13-Mar-1927, 79 y.o.   MRN: [id number]  HPI The patient is an 79 year old white female who is 4 months status post left breast lumpectomy for DCIS. Her postoperative course has been complicated by some redness that showed up around her nipple. This is continuing to fade. She denies any breast pain. She denies any discharge from her nipple. She is taking tamoxifen. She is tolerating this well. She is not to receive any radiation therapy  Review of Systems     Objective:   Physical Exam On exam her left breast incision is healing nicely with no sign of infection or significant seroma. The redness around the nipple is continuing to fade    Assessment:     The patient is 4 months status post left breast lumpectomy for DCIS     Plan:     At this point she will continue to take tamoxifen. She will continue to do regular self exams. We will plan to see her back in about 3 months.

## 2013-02-18 NOTE — Patient Instructions (Signed)
Continue tamoxifen Continue regular self exams 

## 2013-02-24 ENCOUNTER — Ambulatory Visit
Admission: RE | Admit: 2013-02-24 | Discharge: 2013-02-24 | Disposition: A | Discharge status: Home / Self Care | Payer: Medicare PPO | Source: Ambulatory Visit | Attending: Family Medicine | Admitting: Family Medicine

## 2013-02-24 DIAGNOSIS — R0989 Other specified symptoms and signs involving the circulatory and respiratory systems: Secondary | ICD-10-CM

## 2013-05-11 ENCOUNTER — Ambulatory Visit (INDEPENDENT_AMBULATORY_CARE_PROVIDER_SITE_OTHER): Payer: Medicare Other | Admitting: General Surgery

## 2013-05-11 ENCOUNTER — Encounter (INDEPENDENT_AMBULATORY_CARE_PROVIDER_SITE_OTHER): Payer: Self-pay | Admitting: General Surgery

## 2013-05-11 VITALS — BP 120/66 | HR 80 | Temp 97.7°F | Resp 14 | Ht 63.0 in | Wt 136.4 lb

## 2013-05-11 DIAGNOSIS — D0590 Unspecified type of carcinoma in situ of unspecified breast: Secondary | ICD-10-CM

## 2013-05-11 DIAGNOSIS — D0510 Intraductal carcinoma in situ of unspecified breast: Secondary | ICD-10-CM

## 2013-05-11 NOTE — Progress Notes (Signed)
Subjective:     Patient ID: Becky Montoya, female   DOB: 25-Mar-1927, 80 y.o.   MRN: [id number]  HPI The patient is an 80 year old white female who is 6 months status post left breast lumpectomy for DCIS. She notes only some occasional soreness in the lateral left breast. She is taking tamoxifen and tolerated it well. She feels as though she may have a little synergy then she normally does.  Review of Systems  Constitutional: Negative.   HENT: Negative.   Eyes: Negative.   Respiratory: Negative.   Cardiovascular: Negative.   Gastrointestinal: Negative.   Endocrine: Negative.   Genitourinary: Negative.   Musculoskeletal: Negative.   Skin: Negative.   Allergic/Immunologic: Negative.   Neurological: Negative.   Hematological: Negative.   Psychiatric/Behavioral: Negative.        Objective:   Physical Exam  Constitutional: She is oriented to person, place, and time. She appears well-developed and well-nourished.  HENT:  Head: Normocephalic and atraumatic.  Eyes: Conjunctivae and EOM are normal. Pupils are equal, round, and reactive to light.  Neck: Normal range of motion. Neck supple.  Cardiovascular: Normal rate, regular rhythm and normal heart sounds.   Pulmonary/Chest: Effort normal and breath sounds normal.  Her left breast incision is healing nicely with no sign of infection. There is no palpable mass in either breast. There is no palpable axillary, supraclavicular, or cervical lymphadenopathy. She does have a skin lesion that looks benign in the upper inner right breast measuring about 1 cm across  Abdominal: Soft. Bowel sounds are normal.  Musculoskeletal: Normal range of motion.  Lymphadenopathy:    She has no cervical adenopathy.  Neurological: She is alert and oriented to person, place, and time.  Skin: Skin is warm and dry.  Psychiatric: She has a normal mood and affect. Her behavior is normal.       Assessment:     The patient is 6 months status post left breast  lumpectomy for DCIS     Plan:     At this point she will continue to take tamoxifen. She will continue to do regular self exams. I will plan to see her back in about 6 months.

## 2013-05-11 NOTE — Patient Instructions (Signed)
Continue tamoxifen Continue regular self exams

## 2013-07-10 ENCOUNTER — Other Ambulatory Visit: Payer: Self-pay | Admitting: Dermatology

## 2013-07-10 DIAGNOSIS — L821 Other seborrheic keratosis: Secondary | ICD-10-CM | POA: Diagnosis not present

## 2013-07-10 DIAGNOSIS — D485 Neoplasm of uncertain behavior of skin: Secondary | ICD-10-CM | POA: Diagnosis not present

## 2013-07-10 DIAGNOSIS — L820 Inflamed seborrheic keratosis: Secondary | ICD-10-CM | POA: Diagnosis not present

## 2013-07-10 DIAGNOSIS — Z85828 Personal history of other malignant neoplasm of skin: Secondary | ICD-10-CM | POA: Diagnosis not present

## 2013-08-20 DIAGNOSIS — D239 Other benign neoplasm of skin, unspecified: Secondary | ICD-10-CM | POA: Diagnosis not present

## 2013-08-20 DIAGNOSIS — L821 Other seborrheic keratosis: Secondary | ICD-10-CM | POA: Diagnosis not present

## 2013-08-20 DIAGNOSIS — Z85828 Personal history of other malignant neoplasm of skin: Secondary | ICD-10-CM | POA: Diagnosis not present

## 2013-08-21 DIAGNOSIS — E785 Hyperlipidemia, unspecified: Secondary | ICD-10-CM | POA: Diagnosis not present

## 2013-08-21 DIAGNOSIS — I10 Essential (primary) hypertension: Secondary | ICD-10-CM | POA: Diagnosis not present

## 2013-08-21 DIAGNOSIS — G479 Sleep disorder, unspecified: Secondary | ICD-10-CM | POA: Diagnosis not present

## 2013-08-21 DIAGNOSIS — Z23 Encounter for immunization: Secondary | ICD-10-CM | POA: Diagnosis not present

## 2013-08-21 DIAGNOSIS — M810 Age-related osteoporosis without current pathological fracture: Secondary | ICD-10-CM | POA: Diagnosis not present

## 2013-08-21 DIAGNOSIS — E039 Hypothyroidism, unspecified: Secondary | ICD-10-CM | POA: Diagnosis not present

## 2013-08-21 DIAGNOSIS — Z0000 Encounter for general adult medical examination without abnormal findings: Secondary | ICD-10-CM | POA: Diagnosis not present

## 2013-09-01 DIAGNOSIS — M949 Disorder of cartilage, unspecified: Secondary | ICD-10-CM | POA: Diagnosis not present

## 2013-09-01 DIAGNOSIS — M899 Disorder of bone, unspecified: Secondary | ICD-10-CM | POA: Diagnosis not present

## 2013-09-04 DIAGNOSIS — Z853 Personal history of malignant neoplasm of breast: Secondary | ICD-10-CM | POA: Diagnosis not present

## 2013-09-11 DIAGNOSIS — M810 Age-related osteoporosis without current pathological fracture: Secondary | ICD-10-CM | POA: Diagnosis not present

## 2013-10-28 DIAGNOSIS — M21619 Bunion of unspecified foot: Secondary | ICD-10-CM | POA: Diagnosis not present

## 2013-10-28 DIAGNOSIS — D485 Neoplasm of uncertain behavior of skin: Secondary | ICD-10-CM | POA: Diagnosis not present

## 2013-10-31 ENCOUNTER — Other Ambulatory Visit: Payer: Self-pay | Admitting: Oncology

## 2013-10-31 DIAGNOSIS — C50412 Malignant neoplasm of upper-outer quadrant of left female breast: Secondary | ICD-10-CM

## 2013-11-16 ENCOUNTER — Encounter (INDEPENDENT_AMBULATORY_CARE_PROVIDER_SITE_OTHER): Payer: Self-pay | Admitting: General Surgery

## 2013-11-16 ENCOUNTER — Ambulatory Visit (INDEPENDENT_AMBULATORY_CARE_PROVIDER_SITE_OTHER): Payer: Medicare Other | Admitting: General Surgery

## 2013-11-16 VITALS — BP 120/66 | HR 86 | Temp 98.1°F | Ht 63.0 in | Wt 132.0 lb

## 2013-11-16 DIAGNOSIS — D0590 Unspecified type of carcinoma in situ of unspecified breast: Secondary | ICD-10-CM

## 2013-11-16 DIAGNOSIS — D0512 Intraductal carcinoma in situ of left breast: Secondary | ICD-10-CM

## 2013-11-16 NOTE — Progress Notes (Signed)
Subjective:     Patient ID: Becky Montoya, female   DOB: 1926-11-09, 80 y.o.   MRN: [id number]  HPI The patient is an 21 her white female who is one year status post left breast lumpectomy for DCIS. She is taking tamoxifen and tolerating that well. Her only complaint is of not having a lot of energy. She recently had a mammogram that showed no evidence of malignancy.  Review of Systems  Constitutional: Positive for fatigue.  HENT: Negative.   Eyes: Negative.   Respiratory: Negative.   Cardiovascular: Negative.   Gastrointestinal: Negative.   Endocrine: Negative.   Genitourinary: Negative.   Musculoskeletal: Negative.   Skin: Negative.   Allergic/Immunologic: Negative.   Neurological: Negative.   Hematological: Negative.   Psychiatric/Behavioral: Negative.        Objective:   Physical Exam  Constitutional: She is oriented to person, place, and time. She appears well-developed and well-nourished.  HENT:  Head: Normocephalic and atraumatic.  Eyes: Conjunctivae and EOM are normal. Pupils are equal, round, and reactive to light.  Neck: Normal range of motion. Neck supple.  Cardiovascular: Normal rate, regular rhythm and normal heart sounds.   Pulmonary/Chest: Effort normal and breath sounds normal.  Her left breast incision has healed nicely. There is no palpable mass in either breast. There is no palpable axillary, supraclavicular, or cervical lymphadenopathy. She does have a mole on her upper inner right breast that is followed by her dermatologist  Abdominal: Soft. Bowel sounds are normal.  Musculoskeletal: Normal range of motion.  Lymphadenopathy:    She has no cervical adenopathy.  Neurological: She is alert and oriented to person, place, and time.  Skin: Skin is warm and dry.  Psychiatric: She has a normal mood and affect. Her behavior is normal.       Assessment:     The patient is one year status post left breast lumpectomy for DCIS     Plan:     At this point  she will continue to take tamoxifen. She will continue to do regular self exams. I will plan to see her back in about 6 months.

## 2013-11-16 NOTE — Patient Instructions (Signed)
Continue regular self exams Continue tamoxifen

## 2013-12-23 ENCOUNTER — Other Ambulatory Visit: Payer: Self-pay | Admitting: Emergency Medicine

## 2013-12-23 DIAGNOSIS — C50419 Malignant neoplasm of upper-outer quadrant of unspecified female breast: Secondary | ICD-10-CM

## 2013-12-24 ENCOUNTER — Other Ambulatory Visit (HOSPITAL_BASED_OUTPATIENT_CLINIC_OR_DEPARTMENT_OTHER): Payer: Medicare Other

## 2013-12-24 DIAGNOSIS — C50419 Malignant neoplasm of upper-outer quadrant of unspecified female breast: Secondary | ICD-10-CM

## 2013-12-24 DIAGNOSIS — D0590 Unspecified type of carcinoma in situ of unspecified breast: Secondary | ICD-10-CM

## 2013-12-24 LAB — CBC WITH DIFFERENTIAL/PLATELET
BASO%: 1.1 % (ref 0.0–2.0)
BASOS ABS: 0.1 10*3/uL (ref 0.0–0.1)
EOS ABS: 0.1 10*3/uL (ref 0.0–0.5)
EOS%: 1.8 % (ref 0.0–7.0)
HEMATOCRIT: 39 % (ref 34.8–46.6)
HEMOGLOBIN: 12.9 g/dL (ref 11.6–15.9)
LYMPH%: 26.4 % (ref 14.0–49.7)
MCH: 31.4 pg (ref 25.1–34.0)
MCHC: 33.2 g/dL (ref 31.5–36.0)
MCV: 94.6 fL (ref 79.5–101.0)
MONO#: 0.6 10*3/uL (ref 0.1–0.9)
MONO%: 11.1 % (ref 0.0–14.0)
NEUT%: 59.6 % (ref 38.4–76.8)
NEUTROS ABS: 3.4 10*3/uL (ref 1.5–6.5)
PLATELETS: 111 10*3/uL — AB (ref 145–400)
RBC: 4.12 10*6/uL (ref 3.70–5.45)
RDW: 12.5 % (ref 11.2–14.5)
WBC: 5.6 10*3/uL (ref 3.9–10.3)
lymph#: 1.5 10*3/uL (ref 0.9–3.3)

## 2013-12-24 LAB — COMPREHENSIVE METABOLIC PANEL (CC13)
ALBUMIN: 3.6 g/dL (ref 3.5–5.0)
ALK PHOS: 41 U/L (ref 40–150)
ALT: 13 U/L (ref 0–55)
AST: 21 U/L (ref 5–34)
Anion Gap: 9 mEq/L (ref 3–11)
BUN: 13 mg/dL (ref 7.0–26.0)
CO2: 26 mEq/L (ref 22–29)
CREATININE: 0.9 mg/dL (ref 0.6–1.1)
Calcium: 9.1 mg/dL (ref 8.4–10.4)
Chloride: 109 mEq/L (ref 98–109)
GLUCOSE: 99 mg/dL (ref 70–140)
POTASSIUM: 3.9 meq/L (ref 3.5–5.1)
Sodium: 144 mEq/L (ref 136–145)
Total Bilirubin: 0.65 mg/dL (ref 0.20–1.20)
Total Protein: 6.3 g/dL — ABNORMAL LOW (ref 6.4–8.3)

## 2013-12-31 ENCOUNTER — Ambulatory Visit (HOSPITAL_BASED_OUTPATIENT_CLINIC_OR_DEPARTMENT_OTHER): Payer: Medicare Other | Admitting: Oncology

## 2013-12-31 VITALS — BP 129/63 | HR 80 | Temp 98.6°F | Resp 18 | Ht 63.0 in | Wt 131.5 lb

## 2013-12-31 DIAGNOSIS — D0590 Unspecified type of carcinoma in situ of unspecified breast: Secondary | ICD-10-CM

## 2013-12-31 DIAGNOSIS — C50419 Malignant neoplasm of upper-outer quadrant of unspecified female breast: Secondary | ICD-10-CM

## 2013-12-31 DIAGNOSIS — Z170 Estrogen receptor positive status [ER+]: Secondary | ICD-10-CM

## 2013-12-31 DIAGNOSIS — C50412 Malignant neoplasm of upper-outer quadrant of left female breast: Secondary | ICD-10-CM

## 2013-12-31 MED ORDER — TAMOXIFEN CITRATE 20 MG PO TABS: ORAL_TABLET | ORAL | Status: DC

## 2013-12-31 NOTE — Progress Notes (Signed)
ID: Becky Montoya OB: 1926/09/14  MR#: [id number]  DXA#:[id number]  PCP: Vidal Schwalbe, MD GYN:   SUStar Age OTHER MD: Arloa Koh, Christene Slates  CHIEF COMPLAINT:  Left Breast Cancer  CURRENT TREATMENT: Tamoxifen    HISTORY OF PRESENT ILLNESS: From the original intake note:  Becky Montoya had routine screening mammography at [facility] 09/03/2012. There was a possible abnormality noted in the left breast and on 09/05/2012 she underwent left diagnostic mammography which confirmed an area of pleomorphic microcalcifications in the upper outer quadrant of the left breast. Biopsy of this area 09/10/2012 showed ([id number]) ductal carcinoma in situ, grade 3, estrogen receptor positive at 100%, progesterone receptor positive at 6%. Bilateral breast MRI 09/16/2012 showed a 2.9 cm area of clumped linear enhancement in the upper outer quadrant of the left breast but no additional areas of concern and no evidence of adenopathy. Incidental hepatic cysts or hemangiomas were noted in the liver.  The patient's subsequent history is as detailed below  INTERVAL HISTORY: Becky Montoya returns today for routine followup. The interval history is unremarkable. She is tolerating tamoxifen with no hot flashes or vaginal wetness issues.  REVIEW OF SYSTEMS: Becky Montoya complains of feeling tired. She is able to get her house work done. She takes to walk 15 minutes every day. However sometimes she just doesn't feel like she wants to get off the couch. This is very nonfocal, and she specifically denies insomnia, unusual stress, or depression. A detailed review of systems today is otherwise entirely noncontributory  PAST MEDICAL HISTORY: Past Medical History  Diagnosis Date  . Hypertension   . Thyroid disease   . Hyperlipidemia   . Osteoporosis   . Breast cancer   . Arthritis     PAST SURGICAL HISTORY: Past Surgical History  Procedure Laterality Date  . Fissures    . Abdominal hysterectomy    . Mouth surgery    .  Appendectomy    . Colonoscopy    . Breast lumpectomy with needle localization Left 10/09/2012    Procedure: BREAST LUMPECTOMY WITH NEEDLE LOCALIZATION;  Surgeon: Merrie Roof, MD;  Location: [facility];  Service: General;  Laterality: Left;  needle localization at [facility]IS 7:30     FAMILY HISTORY Family History  Problem Relation Age of Onset  . Breast cancer Sister     dx in her 34s  . Cancer Maternal Grandmother     unknown form of cancer  . Heart attack Maternal Grandfather   . Cancer Cousin     maternal cousin with unknown form of cancer   the patient's father died from a stroke a bit of 62. The patient's mother died from a heart attack at the age of 37. The patient had one brother and 5 sisters. The patient has one sister with a history of breast cancer diagnosed at age 45 and the patient's mother's mother was diagnosed with some kind of cancer at the age of 11. There is no history of ovarian cancer in the family to the patient's knowledge.  GYNECOLOGIC HISTORY:  Menarche age 71, she is [place] P3, with her first live birth at age 39. She underwent hysterectomy with unilateral salpingo-oophorectomy in 1973. She never used birth control.  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 Her husband Becky Montoya, is a retired Cabin crew. Daughter Becky Montoya lives in [place] and works as a Scientist, research (medical). Daughter Becky Montoya lives in [place]  and work for [employer]. The patient had a son who did not survive. She has 4 grandchildren and 2 great-grandchildren. She has a third great-grandchild (a girl) due in February 2015. She attends a [employer].    ADVANCED DIRECTIVES:  These are in place and the patient's daughter Becky Montoya is her healthcare power of attorney. Becky Montoya can be reached at [phone].   HEALTH MAINTENANCE: History  Substance Use Topics  . Smoking status: Never Smoker   . Smokeless  tobacco: Never Used  . Alcohol Use: No     Colonoscopy: January 2011  PAP:  Bone density: July 2013?  Lipid panel: Feb 2014  Allergies  Allergen Reactions  . Wellbutrin [Bupropion] Other (See Comments)    Nervousness    Current Outpatient Prescriptions  Medication Sig Dispense Refill  . aspirin EC 81 MG tablet Take 81 mg by mouth every morning.      . Calcium-Vitamin D (CALTRATE 600 PLUS-VIT D PO) Take by mouth daily.      Marland Kitchen denosumab (PROLIA) 60 MG/ML SOLN injection Inject 60 mg into the skin every 6 (six) months. Administer in upper arm, thigh, or abdomen      . KRILL OIL PO Take by mouth daily.      Marland Kitchen levothyroxine (SYNTHROID, LEVOTHROID) 50 MCG tablet Take 50 mcg by mouth every morning.      Marland Kitchen LORazepam (ATIVAN) 0.5 MG tablet Take 0.5 mg by mouth every 8 (eight) hours as needed for anxiety.      Marland Kitchen losartan-hydrochlorothiazide (HYZAAR) 100-12.5 MG per tablet Take 1 tablet by mouth every morning.      . Misc Natural Products (LUTEIN 20) CAPS Take 1 capsule by mouth every morning.      . Multiple Vitamin (MULTIVITAMIN) LIQD Take 30 mLs by mouth every morning.       . NON FORMULARY 2 oz at bedtime. artho bonejoint      . simvastatin (ZOCOR) 40 MG tablet Take 40 mg by mouth every evening.      . tamoxifen (NOLVADEX) 20 MG tablet TAKE ONE TABLET BY MOUTH ONCE DAILY  90 tablet  0  . vitamin B-12 (CYANOCOBALAMIN) 1000 MCG tablet Take 1,000 mcg by mouth daily.      . vitamin E 400 UNIT capsule Take 400 Units by mouth every evening.       No current facility-administered medications for this visit.    OBJECTIVE: Elderly white woman in no acute distress Filed Vitals:   12/31/13 0939  BP: 129/63  Pulse: 80  Temp: 98.6 F (37 C)  Resp: 18     Body mass index is 23.3 kg/(m^2).    ECOG FS: 1 Filed Weights   12/31/13 0939  Weight: 131 lb 8 oz (59.648 kg)   Sclerae unicteric, round and equal Oropharynx clear and moist No cervical or supraclavicular adenopathy Lungs no rales or  rhonchi Heart regular rate and rhythm Abd soft, nontender, positive bowel sounds MSK no focal spinal tenderness, no upper extremity lymphedema Neuro: nonfocal, well oriented, appropriate affect Breasts: The right breast is unremarkable. The left breast is status post lumpectomy, with no evidence of local recurrence. The left axilla is benign.    LAB RESULTS:  CMP     Component Value Date/Time   NA 144 12/24/2013 0938   NA 137 10/08/2012 1254   K 3.9 12/24/2013 0938   K 3.7 10/08/2012 1254   CL 102 10/08/2012 1254   CL 106 09/17/2012 0815   CO2 26 12/24/2013  0938   CO2 25 10/08/2012 1254   GLUCOSE 99 12/24/2013 0938   GLUCOSE 108* 10/08/2012 1254   GLUCOSE 100* 09/17/2012 0815   BUN 13.0 12/24/2013 0938   BUN 15 10/08/2012 1254   CREATININE 0.9 12/24/2013 0938   CREATININE 0.74 10/08/2012 1254   CALCIUM 9.1 12/24/2013 0938   CALCIUM 9.1 10/08/2012 1254   PROT 6.3* 12/24/2013 0938   ALBUMIN 3.6 12/24/2013 0938   AST 21 12/24/2013 0938   ALT 13 12/24/2013 0938   ALKPHOS 41 12/24/2013 0938   BILITOT 0.65 12/24/2013 0938   GFRNONAA 76* 10/08/2012 1254   GFRAA 88* 10/08/2012 1254    I No results found for this basename: SPEP,  UPEP,   kappa and lambda light chains    Lab Results  Component Value Date   WBC 5.6 12/24/2013   NEUTROABS 3.4 12/24/2013   HGB 12.9 12/24/2013   HCT 39.0 12/24/2013   MCV 94.6 12/24/2013   PLT 111* 12/24/2013      Chemistry      Component Value Date/Time   NA 144 12/24/2013 0938   NA 137 10/08/2012 1254   K 3.9 12/24/2013 0938   K 3.7 10/08/2012 1254   CL 102 10/08/2012 1254   CL 106 09/17/2012 0815   CO2 26 12/24/2013 0938   CO2 25 10/08/2012 1254   BUN 13.0 12/24/2013 0938   BUN 15 10/08/2012 1254   CREATININE 0.9 12/24/2013 0938   CREATININE 0.74 10/08/2012 1254      Component Value Date/Time   CALCIUM 9.1 12/24/2013 0938   CALCIUM 9.1 10/08/2012 1254   ALKPHOS 41 12/24/2013 0938   AST 21 12/24/2013 0938   ALT 13 12/24/2013 0938   BILITOT 0.65 12/24/2013 0938        STUDIES:  Mammography at [facility] 09/04/2013 was unremarkable    ASSESSMENT: 80 y.o. [place] woman   (1)  status post left upper outer quadrant lumpectomy 10/08/2012 for a 0.6 cm ductal carcinoma in situ, grade 2, estrogen receptor positive at 100%, progesterone receptor positive at 6%, with ample margins  (2)  started on tamoxifen in July 2014  (3) fatigue  PLAN: Becky Montoya is doing fine from a breast cancer point of view. The plan is to continue tamoxifen for total of 5 years and then she will "graduate".  We talked about fatigue. I gave her information on the [place] program, and in general I recommended she increase her activity level. She could walk twice a day instead of once for instance. If she participates in [place] I think it would be a great boost to her.  She understands we don't generally check her thyroid or cholesterol level and that that is done by her primary care physician.  Becky Montoya has a good understanding of the overall plan. She agrees with it. She knows the goal of treatment in her case is cure. She will call with any problems that may develop before her next visit here, a year from now.   Chauncey Cruel, MD   12/31/2013 10:14 AM

## 2014-01-05 DIAGNOSIS — H3530 Unspecified macular degeneration: Secondary | ICD-10-CM | POA: Diagnosis not present

## 2014-01-13 ENCOUNTER — Encounter: Payer: Self-pay | Admitting: Oncology

## 2014-01-13 NOTE — Progress Notes (Signed)
Called and left a message for call back at [phone].

## 2014-02-18 DIAGNOSIS — I10 Essential (primary) hypertension: Secondary | ICD-10-CM | POA: Diagnosis not present

## 2014-02-18 DIAGNOSIS — D0512 Intraductal carcinoma in situ of left breast: Secondary | ICD-10-CM | POA: Diagnosis not present

## 2014-02-18 DIAGNOSIS — Z23 Encounter for immunization: Secondary | ICD-10-CM | POA: Diagnosis not present

## 2014-02-18 DIAGNOSIS — D696 Thrombocytopenia, unspecified: Secondary | ICD-10-CM | POA: Diagnosis not present

## 2014-02-18 DIAGNOSIS — E039 Hypothyroidism, unspecified: Secondary | ICD-10-CM | POA: Diagnosis not present

## 2014-02-18 DIAGNOSIS — G4700 Insomnia, unspecified: Secondary | ICD-10-CM | POA: Diagnosis not present

## 2014-02-18 DIAGNOSIS — E785 Hyperlipidemia, unspecified: Secondary | ICD-10-CM | POA: Diagnosis not present

## 2014-03-08 DIAGNOSIS — M859 Disorder of bone density and structure, unspecified: Secondary | ICD-10-CM | POA: Diagnosis not present

## 2014-03-22 DIAGNOSIS — D696 Thrombocytopenia, unspecified: Secondary | ICD-10-CM | POA: Diagnosis not present

## 2014-04-26 ENCOUNTER — Other Ambulatory Visit: Payer: Self-pay | Admitting: Oncology

## 2014-06-09 ENCOUNTER — Other Ambulatory Visit: Payer: Self-pay | Admitting: Family Medicine

## 2014-06-09 DIAGNOSIS — I6523 Occlusion and stenosis of bilateral carotid arteries: Secondary | ICD-10-CM

## 2014-06-14 ENCOUNTER — Other Ambulatory Visit: Payer: Medicare Other

## 2014-06-15 ENCOUNTER — Ambulatory Visit
Admission: RE | Admit: 2014-06-15 | Discharge: 2014-06-15 | Disposition: A | Discharge status: Home / Self Care | Payer: Medicare Other | Source: Ambulatory Visit | Attending: Family Medicine | Admitting: Family Medicine

## 2014-06-15 DIAGNOSIS — I6523 Occlusion and stenosis of bilateral carotid arteries: Secondary | ICD-10-CM

## 2014-07-20 DIAGNOSIS — D0512 Intraductal carcinoma in situ of left breast: Secondary | ICD-10-CM | POA: Diagnosis not present

## 2014-07-26 ENCOUNTER — Other Ambulatory Visit: Payer: Self-pay | Admitting: Oncology

## 2014-07-26 ENCOUNTER — Other Ambulatory Visit: Payer: Self-pay | Admitting: *Deleted

## 2014-07-26 ENCOUNTER — Telehealth: Payer: Self-pay | Admitting: Oncology

## 2014-07-26 NOTE — Telephone Encounter (Signed)
per pof to sch pt appt-cld & spoke to pt left message of time & date

## 2014-08-18 DIAGNOSIS — D2371 Other benign neoplasm of skin of right lower limb, including hip: Secondary | ICD-10-CM | POA: Diagnosis not present

## 2014-08-18 DIAGNOSIS — D2372 Other benign neoplasm of skin of left lower limb, including hip: Secondary | ICD-10-CM | POA: Diagnosis not present

## 2014-08-18 DIAGNOSIS — M2012 Hallux valgus (acquired), left foot: Secondary | ICD-10-CM | POA: Diagnosis not present

## 2014-08-19 DIAGNOSIS — I10 Essential (primary) hypertension: Secondary | ICD-10-CM | POA: Diagnosis not present

## 2014-08-19 DIAGNOSIS — D0512 Intraductal carcinoma in situ of left breast: Secondary | ICD-10-CM | POA: Diagnosis not present

## 2014-08-19 DIAGNOSIS — E039 Hypothyroidism, unspecified: Secondary | ICD-10-CM | POA: Diagnosis not present

## 2014-08-19 DIAGNOSIS — M810 Age-related osteoporosis without current pathological fracture: Secondary | ICD-10-CM | POA: Diagnosis not present

## 2014-08-19 DIAGNOSIS — D696 Thrombocytopenia, unspecified: Secondary | ICD-10-CM | POA: Diagnosis not present

## 2014-08-19 DIAGNOSIS — G4700 Insomnia, unspecified: Secondary | ICD-10-CM | POA: Diagnosis not present

## 2014-08-19 DIAGNOSIS — Z1389 Encounter for screening for other disorder: Secondary | ICD-10-CM | POA: Diagnosis not present

## 2014-08-19 DIAGNOSIS — E785 Hyperlipidemia, unspecified: Secondary | ICD-10-CM | POA: Diagnosis not present

## 2014-09-09 DIAGNOSIS — M949 Disorder of cartilage, unspecified: Secondary | ICD-10-CM | POA: Diagnosis not present

## 2014-09-13 ENCOUNTER — Encounter: Payer: Self-pay | Admitting: Oncology

## 2014-09-13 DIAGNOSIS — Z853 Personal history of malignant neoplasm of breast: Secondary | ICD-10-CM | POA: Diagnosis not present

## 2014-09-17 DIAGNOSIS — M2012 Hallux valgus (acquired), left foot: Secondary | ICD-10-CM | POA: Diagnosis not present

## 2014-10-25 DIAGNOSIS — D485 Neoplasm of uncertain behavior of skin: Secondary | ICD-10-CM | POA: Diagnosis not present

## 2014-10-25 DIAGNOSIS — L570 Actinic keratosis: Secondary | ICD-10-CM | POA: Diagnosis not present

## 2014-10-25 DIAGNOSIS — L821 Other seborrheic keratosis: Secondary | ICD-10-CM | POA: Diagnosis not present

## 2014-10-26 ENCOUNTER — Other Ambulatory Visit: Payer: Self-pay | Admitting: Oncology

## 2014-12-22 DIAGNOSIS — L820 Inflamed seborrheic keratosis: Secondary | ICD-10-CM | POA: Diagnosis not present

## 2014-12-23 ENCOUNTER — Ambulatory Visit (HOSPITAL_BASED_OUTPATIENT_CLINIC_OR_DEPARTMENT_OTHER): Payer: Medicare Other | Admitting: Oncology

## 2014-12-23 ENCOUNTER — Telehealth: Payer: Self-pay | Admitting: Oncology

## 2014-12-23 ENCOUNTER — Other Ambulatory Visit (HOSPITAL_BASED_OUTPATIENT_CLINIC_OR_DEPARTMENT_OTHER): Payer: Medicare Other

## 2014-12-23 VITALS — BP 128/68 | HR 78 | Temp 98.2°F | Resp 18 | Ht 63.0 in | Wt 127.7 lb

## 2014-12-23 DIAGNOSIS — C50419 Malignant neoplasm of upper-outer quadrant of unspecified female breast: Secondary | ICD-10-CM

## 2014-12-23 DIAGNOSIS — C50412 Malignant neoplasm of upper-outer quadrant of left female breast: Secondary | ICD-10-CM | POA: Diagnosis not present

## 2014-12-23 DIAGNOSIS — D0512 Intraductal carcinoma in situ of left breast: Secondary | ICD-10-CM

## 2014-12-23 DIAGNOSIS — I6523 Occlusion and stenosis of bilateral carotid arteries: Secondary | ICD-10-CM

## 2014-12-23 LAB — CBC WITH DIFFERENTIAL/PLATELET
BASO%: 0.6 % (ref 0.0–2.0)
Basophils Absolute: 0 10*3/uL (ref 0.0–0.1)
EOS ABS: 0.1 10*3/uL (ref 0.0–0.5)
EOS%: 2.3 % (ref 0.0–7.0)
HEMATOCRIT: 37.5 % (ref 34.8–46.6)
HGB: 12.9 g/dL (ref 11.6–15.9)
LYMPH#: 1.9 10*3/uL (ref 0.9–3.3)
LYMPH%: 30 % (ref 14.0–49.7)
MCH: 32.2 pg (ref 25.1–34.0)
MCHC: 34.4 g/dL (ref 31.5–36.0)
MCV: 93.5 fL (ref 79.5–101.0)
MONO#: 0.7 10*3/uL (ref 0.1–0.9)
MONO%: 11.1 % (ref 0.0–14.0)
NEUT%: 56 % (ref 38.4–76.8)
NEUTROS ABS: 3.5 10*3/uL (ref 1.5–6.5)
PLATELETS: 113 10*3/uL — AB (ref 145–400)
RBC: 4.01 10*6/uL (ref 3.70–5.45)
RDW: 12.5 % (ref 11.2–14.5)
WBC: 6.2 10*3/uL (ref 3.9–10.3)

## 2014-12-23 LAB — COMPREHENSIVE METABOLIC PANEL (CC13)
ALK PHOS: 42 U/L (ref 40–150)
ALT: 15 U/L (ref 0–55)
ANION GAP: 6 meq/L (ref 3–11)
AST: 21 U/L (ref 5–34)
Albumin: 3.7 g/dL (ref 3.5–5.0)
BILIRUBIN TOTAL: 0.59 mg/dL (ref 0.20–1.20)
BUN: 14 mg/dL (ref 7.0–26.0)
CALCIUM: 9.3 mg/dL (ref 8.4–10.4)
CO2: 29 mEq/L (ref 22–29)
CREATININE: 0.8 mg/dL (ref 0.6–1.1)
Chloride: 107 mEq/L (ref 98–109)
EGFR: 63 mL/min/{1.73_m2} — AB (ref >90)
Glucose: 113 mg/dl (ref 70–140)
Potassium: 3.9 mEq/L (ref 3.5–5.1)
Sodium: 142 mEq/L (ref 136–145)
TOTAL PROTEIN: 6.3 g/dL — AB (ref 6.4–8.3)

## 2014-12-23 NOTE — Progress Notes (Signed)
Mammography 09/13/2014 at [facility] showed no evidence of malignancy. The breast composition was category 8.

## 2014-12-23 NOTE — Progress Notes (Signed)
ID: Becky Montoya OB: Feb 26, 1927  MR#: [id number]  PJK#:[id number]  PCP: Vidal Schwalbe, MD GYN:   SUStar Age OTHER MD: Arloa Koh, Christene Slates  CHIEF COMPLAINT:  Left Breast Cancer  CURRENT TREATMENT: Tamoxifen    HISTORY OF PRESENT ILLNESS: From the original intake note:  Becky Montoya had routine screening mammography at [facility] 09/03/2012. There was a possible abnormality noted in the left breast and on 09/05/2012 she underwent left diagnostic mammography which confirmed an area of pleomorphic microcalcifications in the upper outer quadrant of the left breast. Biopsy of this area 09/10/2012 showed ([id number]) ductal carcinoma in situ, grade 3, estrogen receptor positive at 100%, progesterone receptor positive at 6%. Bilateral breast MRI 09/16/2012 showed a 2.9 cm area of clumped linear enhancement in the upper outer quadrant of the left breast but no additional areas of concern and no evidence of adenopathy. Incidental hepatic cysts or hemangiomas were noted in the liver.  The patient's subsequent history is as detailed below  INTERVAL HISTORY: Becky Montoya returns today for follow-up of her noninvasive breast cancer. She continues on tamoxifen. She generally tolerates this well. She has a little bit of fatigue and some ankle swelling which she thinks might be related, although the ankle swelling at least certainly would be unusual. She doesn't have hot flashes. She is having a mild vaginal wetness which might be related to the tamoxifen. She obtains a drug for $36/three-month supply  REVIEW OF SYSTEMS: Becky Montoya has some cramping at night sometimes. She does have arthritic pains here in there which are not more intense or persistent than before. She feels forgetful, but not depressed or anxious. A detailed review of systems today was otherwise stable.  PAST MEDICAL HISTORY: Past Medical History  Diagnosis Date  . Hypertension   . Thyroid disease   . Hyperlipidemia   . Osteoporosis   .  Breast cancer   . Arthritis     PAST SURGICAL HISTORY: Past Surgical History  Procedure Laterality Date  . Fissures    . Abdominal hysterectomy    . Mouth surgery    . Appendectomy    . Colonoscopy    . Breast lumpectomy with needle localization Left 10/09/2012    Procedure: BREAST LUMPECTOMY WITH NEEDLE LOCALIZATION;  Surgeon: Merrie Roof, MD;  Location: [facility];  Service: General;  Laterality: Left;  needle localization at [facility] 7:30     FAMILY HISTORY Family History  Problem Relation Age of Onset  . Breast cancer Sister     dx in her 85s  . Cancer Maternal Grandmother     unknown form of cancer  . Heart attack Maternal Grandfather   . Cancer Cousin     maternal cousin with unknown form of cancer   the patient's father died from a stroke a bit of 57. The patient's mother died from a heart attack at the age of 4. The patient had one brother and 5 sisters. The patient has one sister with a history of breast cancer diagnosed at age 71 and the patient's mother's mother was diagnosed with some kind of cancer at the age of 43. There is no history of ovarian cancer in the family to the patient's knowledge.  GYNECOLOGIC HISTORY:  Menarche age 20, she is [place] P3, with her first live birth at age 68. She underwent hysterectomy with unilateral salpingo-oophorectomy in 1973. She never used birth control.  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 Her husband Becky Montoya, is a retired Cabin crew. Daughter Becky Montoya lives in [place] and works as a Scientist, research (medical). Daughter Becky Montoya lives in [place] and work for [employer]. The patient had a son who did not survive. She has 4 grandchildren and 2 great-grandchildren. She has a third great-grandchild (a girl) due in February 2015. She attends a [employer].    ADVANCED DIRECTIVES:  These are in place and the patient's daughter Becky Montoya is  her healthcare power of attorney. Becky Montoya can be reached at [phone].   HEALTH MAINTENANCE: Social History  Substance Use Topics  . Smoking status: Never Smoker   . Smokeless tobacco: Never Used  . Alcohol Use: No     Colonoscopy: January 2011  PAP:  Bone density: July 2013?  Lipid panel: Feb 2014  Allergies  Allergen Reactions  . Wellbutrin [Bupropion] Other (See Comments)    Nervousness    Current Outpatient Prescriptions  Medication Sig Dispense Refill  . aspirin EC 81 MG tablet Take 81 mg by mouth every morning.    . Calcium-Vitamin D (CALTRATE 600 PLUS-VIT D PO) Take by mouth daily.    Marland Kitchen denosumab (PROLIA) 60 MG/ML SOLN injection Inject 60 mg into the skin every 6 (six) months. Administer in upper arm, thigh, or abdomen    . KRILL OIL PO Take by mouth daily.    Marland Kitchen levothyroxine (SYNTHROID, LEVOTHROID) 50 MCG tablet Take 50 mcg by mouth every morning.    Marland Kitchen LORazepam (ATIVAN) 0.5 MG tablet Take 0.5 mg by mouth every 8 (eight) hours as needed for anxiety.    Marland Kitchen losartan-hydrochlorothiazide (HYZAAR) 100-12.5 MG per tablet Take 1 tablet by mouth every morning.    . Misc Natural Products (LUTEIN 20) CAPS Take 1 capsule by mouth every morning.    . Multiple Vitamin (MULTIVITAMIN) LIQD Take 30 mLs by mouth every morning.     . NON FORMULARY 2 oz at bedtime. artho bonejoint    . simvastatin (ZOCOR) 40 MG tablet Take 40 mg by mouth every evening.    . tamoxifen (NOLVADEX) 20 MG tablet TAKE ONE TABLET BY MOUTH ONCE DAILY 90 tablet 1  . vitamin B-12 (CYANOCOBALAMIN) 1000 MCG tablet Take 1,000 mcg by mouth daily.    . vitamin E 400 UNIT capsule Take 400 Units by mouth every evening.     No current facility-administered medications for this visit.    OBJECTIVE: Elderly white woman who appears stated age 81 Vitals:   12/23/14 1403  BP: 128/68  Pulse: 78  Temp: 98.2 F (36.8 C)  Resp: 18     Body mass index is 22.63 kg/(m^2).    ECOG FS: 0 Filed Weights   12/23/14 1403   Weight: 127 lb 11.2 oz (57.924 kg)   Sclerae unicteric, round and equal Oropharynx clear and moist No cervical or supraclavicular adenopathy Lungs no rales or rhonchi Heart regular rate and rhythm Abd soft, nontender, positive bowel sounds MSK no focal spinal tenderness, no upper extremity lymphedema Neuro: nonfocal, well oriented, appropriate affect Breasts: The right breast is unremarkable. The left breast is status post lumpectomy, with no evidence of local recurrence. The left axilla is benign. Skin there is a seborrheic keratosis on the anterior chest wall right where the bra hits which is very uncomfortable for her.   LAB RESULTS:  CMP     Component Value Date/Time   NA 144 12/24/2013 0938   NA 137 10/08/2012 1254   K 3.9  12/24/2013 0938   K 3.7 10/08/2012 1254   CL 102 10/08/2012 1254   CL 106 09/17/2012 0815   CO2 26 12/24/2013 0938   CO2 25 10/08/2012 1254   GLUCOSE 99 12/24/2013 0938   GLUCOSE 108* 10/08/2012 1254   GLUCOSE 100* 09/17/2012 0815   BUN 13.0 12/24/2013 0938   BUN 15 10/08/2012 1254   CREATININE 0.9 12/24/2013 0938   CREATININE 0.74 10/08/2012 1254   CALCIUM 9.1 12/24/2013 0938   CALCIUM 9.1 10/08/2012 1254   PROT 6.3* 12/24/2013 0938   ALBUMIN 3.6 12/24/2013 0938   AST 21 12/24/2013 0938   ALT 13 12/24/2013 0938   ALKPHOS 41 12/24/2013 0938   BILITOT 0.65 12/24/2013 0938   GFRNONAA 76* 10/08/2012 1254   GFRAA 88* 10/08/2012 1254    I No results found for: SPEP  Lab Results  Component Value Date   WBC 6.2 12/23/2014   NEUTROABS 3.5 12/23/2014   HGB 12.9 12/23/2014   HCT 37.5 12/23/2014   MCV 93.5 12/23/2014   PLT 113* 12/23/2014      Chemistry      Component Value Date/Time   NA 144 12/24/2013 0938   NA 137 10/08/2012 1254   K 3.9 12/24/2013 0938   K 3.7 10/08/2012 1254   CL 102 10/08/2012 1254   CL 106 09/17/2012 0815   CO2 26 12/24/2013 0938   CO2 25 10/08/2012 1254   BUN 13.0 12/24/2013 0938   BUN 15 10/08/2012 1254    CREATININE 0.9 12/24/2013 0938   CREATININE 0.74 10/08/2012 1254      Component Value Date/Time   CALCIUM 9.1 12/24/2013 0938   CALCIUM 9.1 10/08/2012 1254   ALKPHOS 41 12/24/2013 0938   AST 21 12/24/2013 0938   ALT 13 12/24/2013 0938   BILITOT 0.65 12/24/2013 0938       STUDIES:  Mammography at [facility] June 2016 was unremarkable    ASSESSMENT: 81 y.o. [place] woman   (1)  status post left upper outer quadrant lumpectomy 10/08/2012 for a 0.6 cm ductal carcinoma in situ, grade 2, estrogen receptor positive at 100%, progesterone receptor positive at 6%, with ample margins  (2)  started on tamoxifen in July 2014  (3) fatigue  PLAN: [place] is now 2 years out from her surgery for noninvasive breast cancer and doing well.  I don't think that tamoxifen is going to be responsible for the ankle swelling or fatigue. There are too many more likely causes, however just to clear the air I suggested she stop tamoxifen now and wait until after Thanksgiving's. If she is feeling a lot better and the ankle swelling has resolved then we can leave the tamoxifen off. If it really makes no difference going off and she will resume it. Either way she will give [place] a call.  She has a seborrheic keratosis in the anterior chest which is very uncomfortable for her visit hits where the bra mostly it is. I think it would be wonderful if this could be removed. Possibly I'll at her next surgical visit this could be accomplished.  She will see our breast survivorship next practitioner in 2017 and then see me again in 2018.She knows to call for any problems that may develop before her next visit here.   Chauncey Cruel, MD   12/23/2014 2:13 PM

## 2014-12-23 NOTE — Telephone Encounter (Signed)
Gave avs & calendar for September 2017. Patient will see Dr.Magrinat in 2years. No referral placed at time of schedule for survivorship.

## 2015-01-07 DIAGNOSIS — H3531 Nonexudative age-related macular degeneration: Secondary | ICD-10-CM | POA: Diagnosis not present

## 2015-01-11 DIAGNOSIS — D0512 Intraductal carcinoma in situ of left breast: Secondary | ICD-10-CM | POA: Diagnosis not present

## 2015-03-01 ENCOUNTER — Telehealth: Payer: Self-pay | Admitting: *Deleted

## 2015-03-01 NOTE — Telephone Encounter (Signed)
Received call from pt stating that she has been off her tamoxifen for 2 mo as instructed by Dr Jana Hakim & her energy level & swelling are no different.  She wanted Dr Jana Hakim to know this & expects that Dr Jana Hakim would want her to start back on tamoxifen.  She reports having a month supply at home.  Please call her at home.  Note routed to Dr Jana Hakim & Pod RN.

## 2015-03-07 DIAGNOSIS — I10 Essential (primary) hypertension: Secondary | ICD-10-CM | POA: Diagnosis not present

## 2015-03-07 DIAGNOSIS — E039 Hypothyroidism, unspecified: Secondary | ICD-10-CM | POA: Diagnosis not present

## 2015-03-07 DIAGNOSIS — E785 Hyperlipidemia, unspecified: Secondary | ICD-10-CM | POA: Diagnosis not present

## 2015-03-07 DIAGNOSIS — D696 Thrombocytopenia, unspecified: Secondary | ICD-10-CM | POA: Diagnosis not present

## 2015-03-07 DIAGNOSIS — Z1389 Encounter for screening for other disorder: Secondary | ICD-10-CM | POA: Diagnosis not present

## 2015-03-07 DIAGNOSIS — Z23 Encounter for immunization: Secondary | ICD-10-CM | POA: Diagnosis not present

## 2015-03-07 DIAGNOSIS — I6529 Occlusion and stenosis of unspecified carotid artery: Secondary | ICD-10-CM | POA: Diagnosis not present

## 2015-03-07 DIAGNOSIS — Z0000 Encounter for general adult medical examination without abnormal findings: Secondary | ICD-10-CM | POA: Diagnosis not present

## 2015-03-09 ENCOUNTER — Other Ambulatory Visit: Payer: Self-pay

## 2015-03-09 DIAGNOSIS — C50412 Malignant neoplasm of upper-outer quadrant of left female breast: Secondary | ICD-10-CM

## 2015-03-09 MED ORDER — TAMOXIFEN CITRATE 20 MG PO TABS: 20.0000 mg | ORAL_TABLET | Freq: Every day | ORAL | Status: DC

## 2015-03-09 NOTE — Telephone Encounter (Signed)
Patient was off tamoxifen for 2 months due to swelling and lack of energy.  Patient stated that it did not make a difference that she continued with swelling and lack of energy.  Per Dr. Jana Hakim patient is to go back on the tamoxifen.  Patient stated understanding and will start back on the medication today.  Writer sent a refill to her [employer].

## 2015-03-15 DIAGNOSIS — M810 Age-related osteoporosis without current pathological fracture: Secondary | ICD-10-CM | POA: Diagnosis not present

## 2015-05-10 DIAGNOSIS — H16223 Keratoconjunctivitis sicca, not specified as Sjogren's, bilateral: Secondary | ICD-10-CM | POA: Diagnosis not present

## 2015-06-10 ENCOUNTER — Other Ambulatory Visit: Payer: Self-pay | Admitting: Family Medicine

## 2015-06-10 DIAGNOSIS — IMO0001 Reserved for inherently not codable concepts without codable children: Secondary | ICD-10-CM

## 2015-06-16 ENCOUNTER — Ambulatory Visit
Admission: RE | Admit: 2015-06-16 | Discharge: 2015-06-16 | Disposition: A | Discharge status: Home / Self Care | Payer: Medicare Other | Source: Ambulatory Visit | Attending: Family Medicine | Admitting: Family Medicine

## 2015-06-16 DIAGNOSIS — I6523 Occlusion and stenosis of bilateral carotid arteries: Secondary | ICD-10-CM | POA: Diagnosis not present

## 2015-06-16 DIAGNOSIS — IMO0001 Reserved for inherently not codable concepts without codable children: Secondary | ICD-10-CM

## 2015-07-19 DIAGNOSIS — D0512 Intraductal carcinoma in situ of left breast: Secondary | ICD-10-CM | POA: Diagnosis not present

## 2015-09-06 DIAGNOSIS — F5101 Primary insomnia: Secondary | ICD-10-CM | POA: Diagnosis not present

## 2015-09-06 DIAGNOSIS — E039 Hypothyroidism, unspecified: Secondary | ICD-10-CM | POA: Diagnosis not present

## 2015-09-06 DIAGNOSIS — B351 Tinea unguium: Secondary | ICD-10-CM | POA: Diagnosis not present

## 2015-09-06 DIAGNOSIS — I6529 Occlusion and stenosis of unspecified carotid artery: Secondary | ICD-10-CM | POA: Diagnosis not present

## 2015-09-06 DIAGNOSIS — D696 Thrombocytopenia, unspecified: Secondary | ICD-10-CM | POA: Diagnosis not present

## 2015-09-06 DIAGNOSIS — E785 Hyperlipidemia, unspecified: Secondary | ICD-10-CM | POA: Diagnosis not present

## 2015-09-06 DIAGNOSIS — I10 Essential (primary) hypertension: Secondary | ICD-10-CM | POA: Diagnosis not present

## 2015-09-15 ENCOUNTER — Encounter: Payer: Self-pay | Admitting: Oncology

## 2015-09-15 DIAGNOSIS — Z853 Personal history of malignant neoplasm of breast: Secondary | ICD-10-CM | POA: Diagnosis not present

## 2015-09-16 DIAGNOSIS — M810 Age-related osteoporosis without current pathological fracture: Secondary | ICD-10-CM | POA: Diagnosis not present

## 2015-11-18 DIAGNOSIS — M7732 Calcaneal spur, left foot: Secondary | ICD-10-CM | POA: Diagnosis not present

## 2015-11-18 DIAGNOSIS — M79672 Pain in left foot: Secondary | ICD-10-CM | POA: Diagnosis not present

## 2015-11-18 DIAGNOSIS — M722 Plantar fascial fibromatosis: Secondary | ICD-10-CM | POA: Diagnosis not present

## 2015-11-30 DIAGNOSIS — M722 Plantar fascial fibromatosis: Secondary | ICD-10-CM | POA: Diagnosis not present

## 2015-12-16 DIAGNOSIS — M79672 Pain in left foot: Secondary | ICD-10-CM | POA: Diagnosis not present

## 2015-12-16 DIAGNOSIS — M7732 Calcaneal spur, left foot: Secondary | ICD-10-CM | POA: Diagnosis not present

## 2015-12-16 DIAGNOSIS — M722 Plantar fascial fibromatosis: Secondary | ICD-10-CM | POA: Diagnosis not present

## 2015-12-29 ENCOUNTER — Ambulatory Visit (HOSPITAL_BASED_OUTPATIENT_CLINIC_OR_DEPARTMENT_OTHER): Payer: Medicare Other | Admitting: Adult Health

## 2015-12-29 ENCOUNTER — Encounter: Payer: Self-pay | Admitting: Adult Health

## 2015-12-29 VITALS — BP 131/65 | HR 80 | Temp 97.4°F | Wt 126.7 lb

## 2015-12-29 DIAGNOSIS — R5383 Other fatigue: Secondary | ICD-10-CM | POA: Diagnosis not present

## 2015-12-29 DIAGNOSIS — D0512 Intraductal carcinoma in situ of left breast: Secondary | ICD-10-CM

## 2015-12-29 DIAGNOSIS — C50412 Malignant neoplasm of upper-outer quadrant of left female breast: Secondary | ICD-10-CM

## 2015-12-29 NOTE — Progress Notes (Signed)
CLINIC:  Survivorship   REASON FOR VISIT:  Routine follow-up for history of breast cancer.   BRIEF ONCOLOGIC HISTORY:  (from Dr. Virgie Dad last visit on 12/23/14)    INTERVAL HISTORY:  Becky Montoya presents to the [facility] today for routine follow-up for her history of breast cancer.  Overall, she reports feeling well.  She remains on the Tamoxifen with great tolerance.  Denies hot flashes/night sweats or vaginal bleeding.  She continues to have some mild fatigue (chronic issue for her).  Her thyroid was recently checked and is managed by her PCP.  She has had some muscle aches/cramping to her legs.  She found out that she had some inflammation around a bone spur, underwent injections, and is doing exercises she was taught, which is helping tremendously.  She also has arthritis, particularly to her back, which is chronic and manageable; no new changes in this pain.  She feels like she is getting more forgetful. For example, she showed up a week early for an appointment recently.  She feels like it is happening more frequently and it concerns her at times; she manages this with making more notes to herself.  She has a mole on her right breast that she wants me to look at today.  Otherwise, she has no complaints.   REVIEW OF SYSTEMS:  Review of Systems  Constitutional: Positive for malaise/fatigue.  HENT: Negative.   Eyes: Negative.   Respiratory: Negative.   Cardiovascular: Negative.   Gastrointestinal: Negative.   Genitourinary: Negative.   Musculoskeletal: Positive for back pain, joint pain and myalgias.       Chronic  Skin:       (R) breast mole   Neurological: Negative.   Endo/Heme/Allergies:       Thyroid problems (managed by PCP)  Psychiatric/Behavioral: Positive for memory loss.  GU: Denies vaginal bleeding, discharge, or dryness.  Breast: Denies any new nodularity, masses, tenderness, nipple changes, or nipple discharge.    A 14-point review of systems was  completed and was negative, except as noted above.    PAST MEDICAL/SURGICAL HISTORY:  Past Medical History:  Diagnosis Date  . Arthritis   . Breast cancer   . Hyperlipidemia   . Hypertension   . Osteoporosis   . Thyroid disease    Past Surgical History:  Procedure Laterality Date  . ABDOMINAL HYSTERECTOMY    . APPENDECTOMY    . BREAST LUMPECTOMY WITH NEEDLE LOCALIZATION Left 10/09/2012   Procedure: BREAST LUMPECTOMY WITH NEEDLE LOCALIZATION;  Surgeon: Merrie Roof, MD;  Location: [facility];  Service: General;  Laterality: Left;  needle localization at SOLIS 7:30   . COLONOSCOPY    . fissures    . MOUTH SURGERY       ALLERGIES:  Allergies  Allergen Reactions  . Wellbutrin [Bupropion] Other (See Comments)    Nervousness     CURRENT MEDICATIONS:  Outpatient Encounter Prescriptions as of 12/29/2015  Medication Sig  . aspirin EC 81 MG tablet Take 81 mg by mouth every morning.  . Calcium-Vitamin D (CALTRATE 600 PLUS-VIT D PO) Take by mouth daily.  Marland Kitchen denosumab (PROLIA) 60 MG/ML SOLN injection Inject 60 mg into the skin every 6 (six) months. Administer in upper arm, thigh, or abdomen  . KRILL OIL PO Take by mouth daily.  Marland Kitchen levothyroxine (SYNTHROID, LEVOTHROID) 50 MCG tablet Take 50 mcg by mouth every morning.  Marland Kitchen LORazepam (ATIVAN) 0.5 MG tablet Take 0.5 mg by mouth every 8 (eight) hours  as needed for anxiety.  Marland Kitchen losartan-hydrochlorothiazide (HYZAAR) 100-12.5 MG per tablet Take 1 tablet by mouth every morning.  . Misc Natural Products (LUTEIN 20) CAPS Take 1 capsule by mouth every morning.  . Multiple Vitamin (MULTIVITAMIN) tablet Take 1 tablet by mouth daily.  . NON FORMULARY 2 oz at bedtime. artho bonejoint  . simvastatin (ZOCOR) 40 MG tablet Take 40 mg by mouth every evening.  . tamoxifen (NOLVADEX) 20 MG tablet Take 1 tablet (20 mg total) by mouth daily.  . vitamin B-12 (CYANOCOBALAMIN) 1000 MCG tablet Take 1,000 mcg by mouth daily.  . vitamin E 400 UNIT  capsule Take 400 Units by mouth every evening.  . [DISCONTINUED] Multiple Vitamin (MULTIVITAMIN) LIQD Take 30 mLs by mouth every morning.    No facility-administered encounter medications on file as of 12/29/2015.      ONCOLOGIC FAMILY HISTORY:  Family History  Problem Relation Age of Onset  . Breast cancer Sister     dx in her 23s  . Cancer Maternal Grandmother     unknown form of cancer  . Heart attack Maternal Grandfather   . Cancer Cousin     maternal cousin with unknown form of cancer    GENETIC COUNSELING/TESTING: None.  SOCIAL HISTORY:  Becky Montoya is married and lives with her husband in [place], [place].  She has 2 daughters, 4 grandchildren, and 3 great-grandchildren.  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  She denies any current tobacco, alcohol, or illicit drug use.     PHYSICAL EXAMINATION:  Vital Signs: Vitals:   12/29/15 1140  BP: 131/65  Pulse: 80  Temp: 97.4 F (36.3 C)   Filed Weights   12/29/15 1140  Weight: 126 lb 11.2 oz (57.5 kg)   General: Female in no acute distress.  She is unaccompanied today.   HEENT: Head is normocephalic.  Pupils equal and reactive to light. Conjunctivae clear without exudate.  Sclerae anicteric. Oral mucosa is pink, moist.  Oropharynx is pink without lesions or erythema.  Lymph: No cervical, supraclavicular, or infraclavicular lymphadenopathy noted on palpation.  Cardiovascular: Regular rate and rhythm.Marland Kitchen Respiratory: Clear to auscultation bilaterally. Chest expansion symmetric; breathing non-labored.  Breast Exam:  -Left breast: No appreciable masses on palpation. No skin redness, thickening, or peau d'orange appearance; no nipple retraction or nipple discharge; healed lumpectomy scar without erythema or nodularity.  -Right breast: No appreciable masses on palpation. No skin redness, thickening, or peau d'orange appearance. Right breast at about 2:00 with approx 1 cm x 1 cm dark  brown nevus; does not appear suspicious for malignancy.  -Axilla: No axillary adenopathy bilaterally.  GI: Abdomen soft and round; non-tender, non-distended. Bowel sounds normoactive. No hepatosplenomegaly.   GU: Deferred.  Neuro: No focal deficits. Steady gait.  Psych: Mood and affect normal and appropriate for situation.  Extremities: No edema. Skin: Warm and dry.  LABORATORY DATA:  None for this visit.    DIAGNOSTIC IMAGING:  Most recent mammograms:  09/13/2014   09/15/15     ASSESSMENT AND PLAN:  Becky Montoya is a pleasant 82 y.o. female with history of Stage 0 left breast DCIS, ER+/PR+, diagnosed in 10/2012, treated with lumpectomy and anti-estrogen therapy with Tamoxifen beginning in 10/2012. Planned duration of Tamoxifen is 5 years.  She presents to the [facility] Clinic for surveillance and routine follow-up.   1. History of Stage 0 left breast cancer:  Becky Montoya is currently clinically and radiographically without evidence of disease or  recurrence of breast cancer. Her next mammogram will be due in 09/2016 at [facility]; orders placed today.  She will follow-up with her medical oncologist, Dr. Jana Hakim, in 1 year with history and physical exam.  We discussed that she will be seen at the cancer center for the next 2 years and then she will have the choice to "graduate" from follow-up here.  We also discussed that after 10/2017 (at her 5-year visit), she could also elect to continue to be seen once per year in the Survivorship Clinic and I would be happy to continue to see her, if she chooses.  She will think about this and discuss with Dr. Jana Hakim at upcoming visits.  Encouraged her to call me with any questions or concerns and I could see her back in my clinic sooner, if needed.    2. Fatigue: Encouraged her to continue to walk for exercise. She should also maintain her follow-ups with her PCP, as her thyroid dysfunction can certainly contribute to fatigue as well.    3. Bone health:   Given Becky Montoya's age and history of breast cancer, she is at risk for bone demineralization.  We reviewed that Tamoxifen has the positive side effect of bone strengthening in patients when compared to aromatase inhibitors.  She tells me that her PCP generally orders her DEXA scans, which is great.  I will defer future imaging to her PCP or Dr. Jana Hakim, as clinically indicated.   In the meantime, she was encouraged to increase her consumption of foods rich in calcium, as well as increase her weight-bearing activities.  She was given education on specific food and activities to promote bone health.  4. Health maintenance and wellness promotion: Becky Montoya was encouraged to consume 5-7 servings of fruits and vegetables per day. She was also encouraged to engage in moderate to vigorous exercise for 30 minutes per day most days of the week. She was instructed to limit her alcohol consumption and continue to abstain from tobacco use.   Dispo:  -Annual mammogram due in 09/2016 at [facility]; orders placed today.  -Return to cancer center to see Dr. Jana Hakim in 12/2016; no need for labs given h/o non-invasive disease.    A total of 30 minutes of face-to-face time was spent with this patient with greater than 50% of that time in counseling and care-coordination.   Mike Craze, NP [place] [phone]   Note: PRIMARY CARE PROVIDER Vidal Schwalbe, [place] [phone]

## 2016-01-10 ENCOUNTER — Other Ambulatory Visit: Payer: Self-pay | Admitting: Oncology

## 2016-01-10 DIAGNOSIS — C50412 Malignant neoplasm of upper-outer quadrant of left female breast: Secondary | ICD-10-CM

## 2016-01-12 DIAGNOSIS — H26491 Other secondary cataract, right eye: Secondary | ICD-10-CM | POA: Diagnosis not present

## 2016-01-12 DIAGNOSIS — H353131 Nonexudative age-related macular degeneration, bilateral, early dry stage: Secondary | ICD-10-CM | POA: Diagnosis not present

## 2016-02-03 DIAGNOSIS — D0512 Intraductal carcinoma in situ of left breast: Secondary | ICD-10-CM | POA: Diagnosis not present

## 2016-03-09 DIAGNOSIS — Z23 Encounter for immunization: Secondary | ICD-10-CM | POA: Diagnosis not present

## 2016-03-27 DIAGNOSIS — Z0000 Encounter for general adult medical examination without abnormal findings: Secondary | ICD-10-CM | POA: Diagnosis not present

## 2016-03-27 DIAGNOSIS — D0512 Intraductal carcinoma in situ of left breast: Secondary | ICD-10-CM | POA: Diagnosis not present

## 2016-03-27 DIAGNOSIS — E785 Hyperlipidemia, unspecified: Secondary | ICD-10-CM | POA: Diagnosis not present

## 2016-03-27 DIAGNOSIS — I10 Essential (primary) hypertension: Secondary | ICD-10-CM | POA: Diagnosis not present

## 2016-03-27 DIAGNOSIS — M810 Age-related osteoporosis without current pathological fracture: Secondary | ICD-10-CM | POA: Diagnosis not present

## 2016-03-27 DIAGNOSIS — D696 Thrombocytopenia, unspecified: Secondary | ICD-10-CM | POA: Diagnosis not present

## 2016-07-02 ENCOUNTER — Other Ambulatory Visit: Payer: Self-pay | Admitting: Oncology

## 2016-07-02 DIAGNOSIS — C50412 Malignant neoplasm of upper-outer quadrant of left female breast: Secondary | ICD-10-CM

## 2016-09-19 ENCOUNTER — Encounter: Payer: Self-pay | Admitting: Oncology

## 2016-09-28 ENCOUNTER — Ambulatory Visit
Admission: RE | Admit: 2016-09-28 | Discharge: 2016-09-28 | Disposition: A | Discharge status: Home / Self Care | Payer: Medicare Other | Source: Ambulatory Visit | Attending: Family Medicine | Admitting: Family Medicine

## 2016-09-28 ENCOUNTER — Other Ambulatory Visit: Payer: Self-pay | Admitting: Family Medicine

## 2016-09-28 DIAGNOSIS — M7989 Other specified soft tissue disorders: Secondary | ICD-10-CM

## 2016-11-05 ENCOUNTER — Other Ambulatory Visit: Payer: Self-pay | Admitting: Oncology

## 2016-11-05 DIAGNOSIS — C50412 Malignant neoplasm of upper-outer quadrant of left female breast: Secondary | ICD-10-CM

## 2016-12-27 ENCOUNTER — Ambulatory Visit (HOSPITAL_BASED_OUTPATIENT_CLINIC_OR_DEPARTMENT_OTHER): Payer: Medicare Other | Admitting: Oncology

## 2016-12-27 ENCOUNTER — Telehealth: Payer: Self-pay

## 2016-12-27 VITALS — BP 147/66 | HR 80 | Temp 98.0°F | Resp 17 | Ht 63.0 in | Wt 131.2 lb

## 2016-12-27 DIAGNOSIS — Z170 Estrogen receptor positive status [ER+]: Secondary | ICD-10-CM

## 2016-12-27 DIAGNOSIS — D0512 Intraductal carcinoma in situ of left breast: Secondary | ICD-10-CM | POA: Diagnosis not present

## 2016-12-27 DIAGNOSIS — C50412 Malignant neoplasm of upper-outer quadrant of left female breast: Secondary | ICD-10-CM

## 2016-12-27 NOTE — Progress Notes (Signed)
ID: Ian Bushman OB: [birth year]  MR#: [id number]  CSN#:[id number]  PCP: Harlan Stains, MD GYN:   SUStar Age OTHER MD: Arloa Koh, Christene Slates  CHIEF COMPLAINT:  Left DCIS, estrogen receptor positive  CURRENT TREATMENT: Tamoxifen    HISTORY OF PRESENT ILLNESS: From the original intake note:  Becky Montoya had routine screening mammography at [facility] 09/03/2012. There was a possible abnormality noted in the left breast and on 09/05/2012 she underwent left diagnostic mammography which confirmed an area of pleomorphic microcalcifications in the upper outer quadrant of the left breast. Biopsy of this area 09/10/2012 showed ([id number]) ductal carcinoma in situ, grade 3, estrogen receptor positive at 100%, progesterone receptor positive at 6%. Bilateral breast MRI 09/16/2012 showed a 2.9 cm area of clumped linear enhancement in the upper outer quadrant of the left breast but no additional areas of concern and no evidence of adenopathy. Incidental hepatic cysts or hemangiomas were noted in the liver.  The patient's subsequent history is as detailed below  INTERVAL HISTORY: Becky Montoya returns today for follow-up of her noninvasive breast cancer. She continues on tamoxifen, without hot flashes or increased vaginal discharge. She is accompanied by her husband. Pt reports that she is doing well overall.  Pt notes that she has not had any major events occur since her last visit. Pt states that she was evaluated by her PCP for an area of redness to her left ankle that was treated with abx and a doppler study completed to rule out DVT.    REVIEW OF SYSTEMS: Becky Montoya reports that she doesn't exercise, but notes that her daughter helps her to clean her home. Pt notes that she is still able to drive, which is excellent! Pt reports bilateral foot and ankle swelling. She states she had once episode of left hip pain 4 days ago that alleviated with ambulation with no recent episodes. She denies unusual headaches,  visual changes, nausea, vomiting, or dizziness. There has been no unusual cough, phlegm production, or pleurisy. This been no change in bowel or bladder habits. She denies unexplained fatigue or unexplained weight loss, bleeding, rash, or fever. A detailed review of systems was otherwise negative.     PAST MEDICAL HISTORY: Past Medical History:  Diagnosis Date  . Arthritis   . Breast cancer ([place])   . Hyperlipidemia   . Hypertension   . Osteoporosis   . Thyroid disease     PAST SURGICAL HISTORY: Past Surgical History:  Procedure Laterality Date  . ABDOMINAL HYSTERECTOMY    . APPENDECTOMY    . BREAST LUMPECTOMY WITH NEEDLE LOCALIZATION Left 10/09/2012   Procedure: BREAST LUMPECTOMY WITH NEEDLE LOCALIZATION;  Surgeon: Merrie Roof, MD;  Location: [facility];  Service: General;  Laterality: Left;  needle localization at SOLIS 7:30   . COLONOSCOPY    . fissures    . MOUTH SURGERY      FAMILY HISTORY Family History  Problem Relation Age of Onset  . Breast cancer Sister        dx in her 20s  . Cancer Maternal Grandmother        unknown form of cancer  . Heart attack Maternal Grandfather   . Cancer Cousin        maternal cousin with unknown form of cancer   the patient's father died from a stroke a bit of 79. The patient's mother died from a heart attack at the age of 52. The patient had one brother and 5 sisters. The  patient has one sister with a history of breast cancer diagnosed at age 54 and the patient's mother's mother was diagnosed with some kind of cancer at the age of 43. There is no history of ovarian cancer in the family to the patient's knowledge.  GYNECOLOGIC HISTORY:  Menarche age 53, she is [place] P3, with her first live birth at age 27. She underwent hysterectomy with unilateral salpingo-oophorectomy in 1973. She never used birth control.  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 Her husband W. Krisalyn Yankowski, is a retired Cabin crew. Daughter Olevia Bowens lives in [place] and works as a Scientist, research (medical). Daughter Bethanne Ginger lives in [place] and work for [employer]. The patient had a son who did not survive. She has 4 grandchildren and 2 great-grandchildren. She has a third great-grandchild (a girl) due in February 2015. She attends a [employer].    ADVANCED DIRECTIVES:  These are in place and the patient's daughter Sharee Pimple is her healthcare power of attorney. Sharee Pimple can be reached at [phone].   HEALTH MAINTENANCE: Social History  Substance Use Topics  . Smoking status: Never Smoker  . Smokeless tobacco: Never Used  . Alcohol use No     Colonoscopy: January 2011  PAP:  Bone density: July 2013?  Lipid panel: Feb 2014  Allergies  Allergen Reactions  . Wellbutrin [Bupropion] Other (See Comments)    Nervousness    Current Outpatient Prescriptions  Medication Sig Dispense Refill  . aspirin EC 81 MG tablet Take 81 mg by mouth every morning.    . Calcium-Vitamin D (CALTRATE 600 PLUS-VIT D PO) Take by mouth daily.    Marland Kitchen denosumab (PROLIA) 60 MG/ML SOLN injection Inject 60 mg into the skin every 6 (six) months. Administer in upper arm, thigh, or abdomen    . KRILL OIL PO Take by mouth daily.    Marland Kitchen levothyroxine (SYNTHROID, LEVOTHROID) 50 MCG tablet Take 50 mcg by mouth every morning.    Marland Kitchen LORazepam (ATIVAN) 0.5 MG tablet Take 0.5 mg by mouth every 8 (eight) hours as needed for anxiety.    Marland Kitchen losartan-hydrochlorothiazide (HYZAAR) 100-12.5 MG per tablet Take 1 tablet by mouth every morning.    . Misc Natural Products (LUTEIN 20) CAPS Take 1 capsule by mouth every morning.    . Multiple Vitamin (MULTIVITAMIN) tablet Take 1 tablet by mouth daily.    . NON FORMULARY 2 oz at bedtime. artho bonejoint    . simvastatin (ZOCOR) 40 MG tablet Take 40 mg by mouth every evening.    . tamoxifen (NOLVADEX) 20 MG tablet TAKE 1 TABLET BY MOUTH ONCE DAILY 90 tablet 1  .  vitamin B-12 (CYANOCOBALAMIN) 1000 MCG tablet Take 1,000 mcg by mouth daily.    . vitamin E 400 UNIT capsule Take 400 Units by mouth every evening.     No current facility-administered medications for this visit.     OBJECTIVE: Elderly white woman in no acute distress Vitals:   12/27/16 1242  BP: (!) 147/66  Pulse: 80  Resp: 17  Temp: 98 F (36.7 C)  SpO2: 100%     Body mass index is 23.24 kg/m.    ECOG FS: 1 Filed Weights   12/27/16 1242  Weight: 131 lb 3.2 oz (59.5 kg)   Sclerae unicteric, EOMs intact Oropharynx clear and moist No cervical or supraclavicular adenopathy Lungs no rales or rhonchi Heart regular rate and rhythm Abd soft, nontender, positive  bowel sounds MSK no focal spinal tenderness, no upper extremity lymphedema Neuro: nonfocal, well oriented, appropriate affect Breasts: The right breast is benign. The left breast as undergone lumpectomy. There is no evidence of local recurrence. Both axillae are benign.  LAB RESULTS: Outside labs reviewed and separately scanned  CMP     Component Value Date/Time   NA 142 12/23/2014 1329   K 3.9 12/23/2014 1329   CL 102 10/08/2012 1254   CL 106 09/17/2012 0815   CO2 29 12/23/2014 1329   GLUCOSE 113 12/23/2014 1329   GLUCOSE 100 (H) 09/17/2012 0815   BUN 14.0 12/23/2014 1329   CREATININE 0.8 12/23/2014 1329   CALCIUM 9.3 12/23/2014 1329   PROT 6.3 (L) 12/23/2014 1329   ALBUMIN 3.7 12/23/2014 1329   AST 21 12/23/2014 1329   ALT 15 12/23/2014 1329   ALKPHOS 42 12/23/2014 1329   BILITOT 0.59 12/23/2014 1329   GFRNONAA 76 (L) 10/08/2012 1254   GFRAA 88 (L) 10/08/2012 1254    I No results found for: SPEP  Lab Results  Component Value Date   WBC 6.2 12/23/2014   NEUTROABS 3.5 12/23/2014   HGB 12.9 12/23/2014   HCT 37.5 12/23/2014   MCV 93.5 12/23/2014   PLT 113 (L) 12/23/2014      Chemistry      Component Value Date/Time   NA 142 12/23/2014 1329   K 3.9 12/23/2014 1329   CL 102 10/08/2012 1254   CL  106 09/17/2012 0815   CO2 29 12/23/2014 1329   BUN 14.0 12/23/2014 1329   CREATININE 0.8 12/23/2014 1329      Component Value Date/Time   CALCIUM 9.3 12/23/2014 1329   ALKPHOS 42 12/23/2014 1329   AST 21 12/23/2014 1329   ALT 15 12/23/2014 1329   BILITOT 0.59 12/23/2014 1329       STUDIES:  Bilateral diagnostic mammography at [facility] 09/15/2015 showed the breast density to be category A. There was no evidence of carcinoma.   ASSESSMENT: 83 y.o. [place] woman   (1)  status post left upper outer quadrant lumpectomy 10/08/2012 for a 0.6 cm ductal carcinoma in situ, grade 2, estrogen receptor positive at 100%, progesterone receptor positive at 6%, with ample margins  (2)  started on tamoxifen in July 2014  PLAN: Becky Montoya  Is no little over 4 years out from definitive surgery for her breast cancer. There is no evidence of disease recurrence. This is very favorable.  She continues on tamoxifen, with one more year to go before she "graduate".  We reviewed the fact that her tumor was not invasive therefore cannot travel to other parts of her body. She had forgotten this actually rather important fact. It means his cancer cannot take her life and that her overall prognosis is excellent  She will see me one less time a year from now. She knows to call for any problems that may develop before her next visit. Magrinat, Virgie Dad, MD  12/27/16 1:07 PM Medical Oncology and Hematology [facility] [address] [place], [place] [address] Tel. [phone]    Fax. [phone]   This document serves as a record of services personally performed by Lurline Del, MD. It was created on her behalf by Steva Colder, a trained medical scribe. The creation of this record is based on the scribe's personal observations and the provider's statements to them. This document has been checked and approved by the attending provider.

## 2016-12-27 NOTE — Telephone Encounter (Signed)
Printed patient avs and calender per 9/20 los. Return in 1 year

## 2017-06-24 ENCOUNTER — Other Ambulatory Visit: Payer: Self-pay | Admitting: Family Medicine

## 2017-06-24 DIAGNOSIS — M7989 Other specified soft tissue disorders: Secondary | ICD-10-CM

## 2017-06-26 ENCOUNTER — Ambulatory Visit
Admission: RE | Admit: 2017-06-26 | Discharge: 2017-06-26 | Disposition: A | Discharge status: Home / Self Care | Payer: Medicare Other | Source: Ambulatory Visit | Attending: Family Medicine | Admitting: Family Medicine

## 2017-06-26 DIAGNOSIS — M7989 Other specified soft tissue disorders: Secondary | ICD-10-CM

## 2017-07-02 ENCOUNTER — Other Ambulatory Visit: Payer: Self-pay | Admitting: Oncology

## 2017-07-02 DIAGNOSIS — C50412 Malignant neoplasm of upper-outer quadrant of left female breast: Secondary | ICD-10-CM

## 2017-09-26 ENCOUNTER — Encounter: Payer: Self-pay | Admitting: Oncology

## 2017-12-19 ENCOUNTER — Telehealth: Payer: Self-pay | Admitting: Oncology

## 2017-12-19 NOTE — Telephone Encounter (Signed)
GM PAL 9/19 - moved appointments to 10/10 with LC per GM. Spoke with patient.

## 2017-12-26 ENCOUNTER — Encounter: Payer: Self-pay | Admitting: Physical Therapy

## 2017-12-26 ENCOUNTER — Other Ambulatory Visit: Payer: Self-pay

## 2017-12-26 ENCOUNTER — Ambulatory Visit: Payer: Medicare Other | Attending: Specialist | Admitting: Physical Therapy

## 2017-12-26 ENCOUNTER — Ambulatory Visit: Payer: Medicare Other | Admitting: Oncology

## 2017-12-26 DIAGNOSIS — M25612 Stiffness of left shoulder, not elsewhere classified: Secondary | ICD-10-CM

## 2017-12-26 DIAGNOSIS — M6281 Muscle weakness (generalized): Secondary | ICD-10-CM | POA: Diagnosis present

## 2017-12-26 DIAGNOSIS — M25512 Pain in left shoulder: Secondary | ICD-10-CM | POA: Diagnosis present

## 2017-12-26 NOTE — Therapy (Signed)
[facility] [address] [place], [place], [address] Phone: [phone]   Fax:  [phone]  Physical Therapy Evaluation  Patient Details  Name: Becky Montoya MRN: [id number] Date of Birth: 03-Jul-1926 Referring Provider: Dr. Tonita Cong   Encounter Date: 12/26/2017  PT End of Session - 12/26/17 1221    Visit Number  1    Date for PT Re-Evaluation  02/20/18    Authorization Type  Medicare    PT Start Time  0805    PT Stop Time  0845    PT Time Calculation (min)  40 min    Activity Tolerance  Patient tolerated treatment well       Past Medical History:  Diagnosis Date  . Arthritis   . Breast cancer ([place])   . Hyperlipidemia   . Hypertension   . Osteoporosis   . Thyroid disease     Past Surgical History:  Procedure Laterality Date  . ABDOMINAL HYSTERECTOMY    . APPENDECTOMY    . BREAST LUMPECTOMY WITH NEEDLE LOCALIZATION Left 10/09/2012   Procedure: BREAST LUMPECTOMY WITH NEEDLE LOCALIZATION;  Surgeon: Merrie Roof, MD;  Location: [place];  Service: General;  Laterality: Left;  needle localization at SOLIS 7:30   . COLONOSCOPY    . fissures    . MOUTH SURGERY      There were no vitals filed for this visit.   Subjective Assessment - 12/26/17 0812    Subjective  Left shoulder pain started hurting 2 months ago when I couldn't sleep.  Doctor said arthritis and bursitis and gave me a steroid shot 2 weeks ago.   Hurts to use rolling walker so I generally hold on to it so I hold on to my husband.      Pertinent History  Left breast CA history 5 years ago    Limitations  House hold activities    Diagnostic tests  xray    Patient Stated Goals  use this arm without pain     Currently in Pain?  Yes    Pain Score  2     Pain Location  Shoulder    Pain Orientation  Left    Pain Descriptors / Indicators  Aching;Dull    Pain Type  Acute pain    Pain Onset  More than a month ago    Pain Frequency   Intermittent    Aggravating Factors   lifting my arm up; reaching my back, undressing pullover clothing; using walker    Pain Relieving Factors  Alleve at night         [facility] PT Assessment - 12/26/17 0001      Assessment   Medical Diagnosis  left shoulder impingement     Referring Provider  Dr. Tonita Cong    Onset Date/Surgical Date  --   2 months ago   Hand Dominance  Right    Next MD Visit  end of Oct      Precautions   Precautions  Fall      Restrictions   Weight Bearing Restrictions  No      Balance Screen   Has the patient fallen in the past 6 months  Yes    How many times?  2x   weakness in left leg   Has the patient had a decrease in activity level because of a fear of falling?   Yes    Is the patient reluctant to leave their home because  of a fear of falling?   No      Home Environment   Living Environment  Private residence    Living Arrangements  Spouse/significant other    Type of [place] Access  Level entry    Home Layout  One level    [place] - 4 wheels      Prior Function   Vocation  Retired    Leisure  visit nursing homes and do a service for residents, [employer], church      Observation/Other Assessments   Focus on Therapeutic Outcomes (FOTO)   46% limitation       Posture/Postural Control   Posture Comments  good posture with back support but without quickly goes into kyphotic posture      AROM   Right Shoulder Flexion  132 Degrees    Right Shoulder ABduction  167 Degrees    Right Shoulder Internal Rotation  --   L1   Right Shoulder External Rotation  70 Degrees    Left Shoulder Flexion  123 Degrees    Left Shoulder ABduction  153 Degrees   painful   Left Shoulder Internal Rotation  --   L5 pain   Left Shoulder External Rotation  53 Degrees   pain     Strength   Right Shoulder Flexion  4+/5    Right Shoulder Extension  4+/5    Right Shoulder ABduction  4+/5    Right Shoulder Internal Rotation  4+/5    Right  Shoulder External Rotation  4+/5    Left Shoulder Flexion  4-/5    Left Shoulder Extension  4-/5    Left Shoulder ABduction  3+/5    Left Shoulder Internal Rotation  3+/5    Left Shoulder External Rotation  3+/5      Hawkins-Kennedy test   Findings  Positive    Side  Left      Empty Can test   Findings  Negative      Lag time at 0 degrees   Findings  Negative      Drop Arm test   Findings  Negative                Objective measurements completed on examination: See above findings.              PT Education - 12/26/17 1220    Education Details  Access Code: [email or web address]  supine cane press and flexion;  table slides, scapular retraction    Person(s) Educated  Patient    Methods  Explanation;Demonstration;Handout    Comprehension  Returned demonstration;Verbalized understanding       PT Short Term Goals - 12/26/17 1351      PT SHORT TERM GOAL #1   Title  The patient will demonstrate awareness of basic self care strategies including postural correction and basic ex's to promote healing    Time  4    Period  Weeks    Status  New    Target Date  01/23/18      PT SHORT TERM GOAL #2   Title  The patient will report a 30% improvement in left shoulder with reaching and dressing tasks and for using her rolling walker    Time  4    Period  Weeks    Status  New      PT SHORT TERM GOAL #3   Title  The patient will have improved left shoulder  ROM flexion to 128 degrees needed for reaching overhead cabinets    Time  4    Period  Weeks    Status  New      PT SHORT TERM GOAL #4   Title  The patient will have improved shoulder internal and external rotation ROM to 60 degrees and behind the back to L3 needed for dressing    Time  4    Period  Weeks    Status  New        PT Long Term Goals - 12/26/17 1358      PT LONG TERM GOAL #1   Title  The patient will be independent in safe self progression of HEP     Time  8    Period  Weeks    Status  New     Target Date  02/20/18      PT LONG TERM GOAL #2   Title  The patient will report a 60% improvement in left shoulder pain with reaching, dressing tasks and for using her rolling walker    Time  8    Period  Weeks    Status  New      PT LONG TERM GOAL #3   Title  The patient will have improved left shoulder flexion to 132 degrees needed for reaching overhead shelves    Time  8    Period  Weeks    Status  New      PT LONG TERM GOAL #4   Title  The patient will have left shoulder strength to grossly 4-/5 to 4/5 needed for using carrying light objects    Time  8    Period  Weeks    Status  New      PT LONG TERM GOAL #5   Title  FOTO functional outcome score improved from 46% to 36% indicating improved function with less pain     Time  8    Period  Weeks    Status  New             Plan - 12/26/17 0843    Clinical Impression Statement  The patient is an 84 year old who complains of left shoulder pain started hurting 2 months ago when I couldn't sleep.  No specific injury.  She reports the doctor said she had arthritis and bursitis and she had a steroid shot 2 weeks ago.   Hurts to use rolling walker so she has been holding on to her husband instead.  Reaching up and behind her back for dressing is also painful.  She has painful and limited left shoulder ROM in all planes:  flexion 123 degrees, abduction 153 degrees, external rotation 53 degrees and internal rotation behind the back to L5 spinous process.  Active ROM with gravity eliminated is still painful but minimal pain with passive ROM of the shoulder.   Her strength is painful and limited as well grossly 3+5 to 4-/5.  Very painful Hawkins-Kennedy test.  She would benefit from PT to address these deficits.      History and Personal Factors relevant to plan of care:  some co-morbidities including advanced age;  osteoporosis;  breast Cancer on left 5 years ago; hx of right hip fracture 2012;  good psychosocial support    Clinical  Presentation  Stable    Clinical Decision Making  Low    Rehab Potential  Good    Clinical Impairments Affecting Rehab Potential  osteoporosis;  5 years ago left breast cancer (no U/S);  steroid injection early Sept    PT Frequency  2x / week    PT Duration  8 weeks    PT Treatment/Interventions  ADLs/Self Care Home Management;Cryotherapy;Electrical Stimulation;Moist Heat;Iontophoresis 4mg /ml Dexamethasone;Therapeutic exercise;Neuromuscular re-education;Patient/family education;Manual techniques;Dry needling;Taping    PT Next Visit Plan  review cane ex's;  add isometrics;  UE Ranger;  ES as needed for pain control;  try kinesiotape for support    PT Home Exercise Plan   Access Code: [email or web address] supine cane press and flexion; scapular squeezes    Consulted and Agree with Plan of Care  Patient       Patient will benefit from skilled therapeutic intervention in order to improve the following deficits and impairments:  Pain, Decreased strength, Decreased range of motion, Impaired UE functional use  Visit Diagnosis: Acute pain of left shoulder - Plan: PT plan of care cert/re-cert  Stiffness of left shoulder, not elsewhere classified - Plan: PT plan of care cert/re-cert  Muscle weakness (generalized) - Plan: PT plan of care cert/re-cert     Problem List Patient Active Problem List   Diagnosis Date Noted  . Ductal carcinoma in situ (DCIS) of left breast 12/27/2016  . Mastitis, left, acute 12/19/2012  . Malignant neoplasm of upper-outer quadrant of left breast in female, estrogen receptor positive ([place]) 09/15/2012  . Chest pain 05/28/2012  . Hypothyroidism 05/28/2012  . Hypertension 05/28/2012  . Hyperlipidemia 05/28/2012   Ruben Im, PT 12/26/17 2:45 PM Phone: [phone] Fax: [phone] Alvera Singh 12/26/2017, 2:45 PM  [facility] [address] [place], [place], [address] Phone: [phone]   Fax:   [phone]  Name: Becky Montoya MRN: [id number] Date of Birth: September 13, 1926

## 2017-12-31 ENCOUNTER — Ambulatory Visit: Payer: Medicare Other

## 2017-12-31 DIAGNOSIS — M25512 Pain in left shoulder: Secondary | ICD-10-CM

## 2017-12-31 DIAGNOSIS — M25612 Stiffness of left shoulder, not elsewhere classified: Secondary | ICD-10-CM

## 2017-12-31 DIAGNOSIS — M6281 Muscle weakness (generalized): Secondary | ICD-10-CM

## 2017-12-31 NOTE — Therapy (Signed)
[facility] [address] [place], [place], [address] Phone: [phone]   Fax:  [phone]  Physical Therapy Treatment  Patient Details  Name: Becky Montoya MRN: [id number] Date of Birth: [birth year] Referring Provider: Dr. Tonita Cong   Encounter Date: 12/31/2017  PT End of Session - 12/31/17 1015    Visit Number  2    Date for PT Re-Evaluation  02/20/18    Authorization Type  Medicare    PT Start Time  0935    PT Stop Time  1015    PT Time Calculation (min)  40 min    Activity Tolerance  Patient tolerated treatment well    Behavior During Therapy  [facility] for tasks assessed/performed       Past Medical History:  Diagnosis Date  . Arthritis   . Breast cancer ([place])   . Hyperlipidemia   . Hypertension   . Osteoporosis   . Thyroid disease     Past Surgical History:  Procedure Laterality Date  . ABDOMINAL HYSTERECTOMY    . APPENDECTOMY    . BREAST LUMPECTOMY WITH NEEDLE LOCALIZATION Left 10/09/2012   Procedure: BREAST LUMPECTOMY WITH NEEDLE LOCALIZATION;  Surgeon: Merrie Roof, MD;  Location: [place];  Service: General;  Laterality: Left;  needle localization at [facility] 7:30   . COLONOSCOPY    . fissures    . MOUTH SURGERY      There were no vitals filed for this visit.  Subjective Assessment - 12/31/17 0937    Subjective  I am doing well with my exercises.  My Lt shoulder pain increases to a 3/10 with use sometimes.      Pertinent History  Left breast CA history 5 years ago    Currently in Pain?  No/denies                       [facility] Adult PT Treatment/Exercise - 12/31/17 0001      Exercises   Exercises  Shoulder      Shoulder Exercises: Supine   Other Supine Exercises  chest press and overhead flexion with cane x 10 each      Shoulder Exercises: Seated   Row  Strengthening;Both;20 reps;Theraband    Theraband Level (Shoulder Row)  Level 1 (Yellow)    Other Seated  Exercises  scapular retraction: seated x 10 with education regarding posture    Other Seated Exercises  UE Ranger: flexion x 10, circles clockwise and counter clockwise      Shoulder Exercises: Pulleys   Flexion  3 minutes    Scaption  3 minutes      Shoulder Exercises: Stretch   Table Stretch - Flexion  5 reps;10 seconds      Manual Therapy   Manual Therapy  Passive ROM;Joint mobilization    Manual therapy comments  P/ROM into ER, flexion and abduction to pt tolerance    Joint Mobilization  gentle inferior and posterior glenohumeral glides               PT Short Term Goals - 12/26/17 1351      PT SHORT TERM GOAL #1   Title  The patient will demonstrate awareness of basic self care strategies including postural correction and basic ex's to promote healing    Time  4    Period  Weeks    Status  New    Target Date  01/23/18      PT  SHORT TERM GOAL #2   Title  The patient will report a 30% improvement in left shoulder with reaching and dressing tasks and for using her rolling walker    Time  4    Period  Weeks    Status  New      PT SHORT TERM GOAL #3   Title  The patient will have improved left shoulder ROM flexion to 128 degrees needed for reaching overhead cabinets    Time  4    Period  Weeks    Status  New      PT SHORT TERM GOAL #4   Title  The patient will have improved shoulder internal and external rotation ROM to 60 degrees and behind the back to L3 needed for dressing    Time  4    Period  Weeks    Status  New        PT Long Term Goals - 12/26/17 1358      PT LONG TERM GOAL #1   Title  The patient will be independent in safe self progression of HEP     Time  8    Period  Weeks    Status  New    Target Date  02/20/18      PT LONG TERM GOAL #2   Title  The patient will report a 60% improvement in left shoulder pain with reaching, dressing tasks and for using her rolling walker    Time  8    Period  Weeks    Status  New      PT LONG TERM GOAL  #3   Title  The patient will have improved left shoulder flexion to 132 degrees needed for reaching overhead shelves    Time  8    Period  Weeks    Status  New      PT LONG TERM GOAL #4   Title  The patient will have left shoulder strength to grossly 4-/5 to 4/5 needed for using carrying light objects    Time  8    Period  Weeks    Status  New      PT LONG TERM GOAL #5   Title  FOTO functional outcome score improved from 46% to 36% indicating improved function with less pain     Time  8    Period  Weeks    Status  New            Plan - 12/31/17 0945    Clinical Impression Statement  Pt with first time follow-up after evaluation.  Pt was able to demonstrate all initial HEP correctly today.  PT provided verbal and tactile cues for postural alignment and scapular retraction.  Pt with Lt shoulder pain up to 3/10 with daily use of the Lt UE.  PT focused session on Lt shoulder flexibility and postural strength and pt required moderate tactile and verbal cues for scapular position with exercise.  Pt will continue to benefit from skilled PT for Lt UE flexibility, postural and shoulder strength and pain management as needed.      Rehab Potential  Good    Clinical Impairments Affecting Rehab Potential  osteoporosis;  5 years ago left breast cancer (no U/S);  steroid injection early Sept    PT Frequency  2x / week    PT Duration  8 weeks    PT Treatment/Interventions  ADLs/Self Care Home Management;Cryotherapy;Electrical Stimulation;Moist Heat;Iontophoresis 4mg /ml Dexamethasone;Therapeutic exercise;Neuromuscular re-education;Patient/family education;Manual techniques;Dry needling;Taping  PT Next Visit Plan  review cane ex's;  add isometrics;  UE Ranger;  ES as needed for pain control;  try kinesiotape for support    PT Home Exercise Plan   Access Code: [email or web address]    Consulted and Agree with Plan of Care  Patient       Patient will benefit from skilled therapeutic intervention in order to  improve the following deficits and impairments:  Pain, Decreased strength, Decreased range of motion, Impaired UE functional use  Visit Diagnosis: Acute pain of left shoulder  Stiffness of left shoulder, not elsewhere classified  Muscle weakness (generalized)     Problem List Patient Active Problem List   Diagnosis Date Noted  . Ductal carcinoma in situ (DCIS) of left breast 12/27/2016  . Mastitis, left, acute 12/19/2012  . Malignant neoplasm of upper-outer quadrant of left breast in female, estrogen receptor positive ([place]) 09/15/2012  . Chest pain 05/28/2012  . Hypothyroidism 05/28/2012  . Hypertension 05/28/2012  . Hyperlipidemia 05/28/2012   Sigurd Sos, PT 12/31/17 10:16 AM  [facility] [address] [place], [place], [address] Phone: [phone]   Fax:  [phone]  Name: Becky Montoya MRN: [id number] Date of Birth: [birth year]

## 2018-01-03 ENCOUNTER — Encounter: Payer: Medicare Other | Admitting: Physical Therapy

## 2018-01-07 ENCOUNTER — Ambulatory Visit: Payer: Medicare Other | Attending: Specialist | Admitting: Physical Therapy

## 2018-01-07 ENCOUNTER — Encounter: Payer: Self-pay | Admitting: Physical Therapy

## 2018-01-07 DIAGNOSIS — M25612 Stiffness of left shoulder, not elsewhere classified: Secondary | ICD-10-CM | POA: Insufficient documentation

## 2018-01-07 DIAGNOSIS — M25512 Pain in left shoulder: Secondary | ICD-10-CM | POA: Insufficient documentation

## 2018-01-07 DIAGNOSIS — M6281 Muscle weakness (generalized): Secondary | ICD-10-CM | POA: Insufficient documentation

## 2018-01-07 NOTE — Therapy (Signed)
[facility] [address], [place], [place], [address] Phone: [phone]   Fax:  [phone]  Physical Therapy Treatment  Patient Details  Name: Becky Montoya MRN: [id number] Date of Birth: Dec 09, 1926 Referring Provider (PT): Dr. Tonita Cong   Encounter Date: 01/07/2018  PT End of Session - 01/07/18 1209    Visit Number  3    Date for PT Re-Evaluation  02/20/18    Authorization Type  Medicare    PT Start Time  0935    PT Stop Time  2725    PT Time Calculation (min)  39 min    Activity Tolerance  Patient tolerated treatment well       Past Medical History:  Diagnosis Date  . Arthritis   . Breast cancer ([place])   . Hyperlipidemia   . Hypertension   . Osteoporosis   . Thyroid disease     Past Surgical History:  Procedure Laterality Date  . ABDOMINAL HYSTERECTOMY    . APPENDECTOMY    . BREAST LUMPECTOMY WITH NEEDLE LOCALIZATION Left 10/09/2012   Procedure: BREAST LUMPECTOMY WITH NEEDLE LOCALIZATION;  Surgeon: Merrie Roof, MD;  Location: [place];  Service: General;  Laterality: Left;  needle localization at [facility] 7:30   . COLONOSCOPY    . fissures    . MOUTH SURGERY      There were no vitals filed for this visit.  Subjective Assessment - 01/07/18 0930    Subjective  My shoulder is feeling better.  I've only taken 1 Alleve in the past 2 weeks.  I'm taking Prednisone for my foot, finish tomorrow.      Pertinent History  Left breast CA history 5 years ago;  needs hand held assist or walker for safety    Patient Stated Goals  use this arm without pain     Currently in Pain?  No/denies    Pain Score  0-No pain    Pain Location  Shoulder    Pain Orientation  Left    Pain Type  Acute pain    Aggravating Factors   avoiding reaching into the backseat and not abducting arm                       [facility] Adult PT Treatment/Exercise - 01/07/18 0001      Shoulder Exercises: Supine   Protraction  AROM;Left;10 reps    Other Supine Exercises  bent arm raises 10x      Shoulder Exercises: Seated   Extension  Strengthening;Left;10 reps;Theraband    Theraband Level (Shoulder Extension)  Level 1 (Yellow)    Row  Strengthening;Left;10 reps;Theraband    Theraband Level (Shoulder Row)  Level 1 (Yellow)    Other Seated Exercises  thoracic extension with ball 10x    Other Seated Exercises  UE Ranger: flexion x 10      Shoulder Exercises: Pulleys   Flexion  2 minutes      Shoulder Exercises: Isometric Strengthening   Flexion  5X5"    Extension  5X5"    External Rotation  5X5"    Internal Rotation  5X5"    ABduction  5X5"      Manual Therapy   Manual Therapy  --   manually resisted shoulder extension from elevated position    Manual therapy comments  P/ROM into ER, flexion and abduction to pt tolerance             PT  Education - 01/07/18 1002    Education Details   Access Code: [email or web address]   Isometrics       PT Short Term Goals - 12/26/17 1351      PT SHORT TERM GOAL #1   Title  The patient will demonstrate awareness of basic self care strategies including postural correction and basic ex's to promote healing    Time  4    Period  Weeks    Status  New    Target Date  01/23/18      PT SHORT TERM GOAL #2   Title  The patient will report a 30% improvement in left shoulder with reaching and dressing tasks and for using her rolling walker    Time  4    Period  Weeks    Status  New      PT SHORT TERM GOAL #3   Title  The patient will have improved left shoulder ROM flexion to 128 degrees needed for reaching overhead cabinets    Time  4    Period  Weeks    Status  New      PT SHORT TERM GOAL #4   Title  The patient will have improved shoulder internal and external rotation ROM to 60 degrees and behind the back to L3 needed for dressing    Time  4    Period  Weeks    Status  New        PT Long Term Goals - 12/26/17 1358      PT LONG TERM GOAL #1    Title  The patient will be independent in safe self progression of HEP     Time  8    Period  Weeks    Status  New    Target Date  02/20/18      PT LONG TERM GOAL #2   Title  The patient will report a 60% improvement in left shoulder pain with reaching, dressing tasks and for using her rolling walker    Time  8    Period  Weeks    Status  New      PT LONG TERM GOAL #3   Title  The patient will have improved left shoulder flexion to 132 degrees needed for reaching overhead shelves    Time  8    Period  Weeks    Status  New      PT LONG TERM GOAL #4   Title  The patient will have left shoulder strength to grossly 4-/5 to 4/5 needed for using carrying light objects    Time  8    Period  Weeks    Status  New      PT LONG TERM GOAL #5   Title  FOTO functional outcome score improved from 46% to 36% indicating improved function with less pain     Time  8    Period  Weeks    Status  New            Plan - 01/07/18 0949    Clinical Impression Statement  The patient has much improved shoulder ROM with less complaint of pain.  She needs extensive instruction to perform isometrics correctly with verbal cues to use submaximal effort.  Encourage thoracic extension for improved glenohumeral mobility.   Therapist closely monitoring pain intensity throughout treatment session.      Rehab Potential  Good    Clinical Impairments Affecting Rehab Potential  osteoporosis;  5 years  ago left breast cancer (no U/S);  steroid injection early Sept    PT Frequency  2x / week    PT Duration  8 weeks    PT Treatment/Interventions  ADLs/Self Care Home Management;Cryotherapy;Electrical Stimulation;Moist Heat;Iontophoresis 4mg /ml Dexamethasone;Therapeutic exercise;Neuromuscular re-education;Patient/family education;Manual techniques;Dry needling;Taping    PT Next Visit Plan  review isometrics;  add yellow band rows and extensions to HEP;  recheck shoulder ROM    PT Home Exercise Plan   Access Code:  [email or web address]    Consulted and Agree with Plan of Care  Patient       Patient will benefit from skilled therapeutic intervention in order to improve the following deficits and impairments:  Pain, Decreased strength, Decreased range of motion, Impaired UE functional use  Visit Diagnosis: Acute pain of left shoulder  Stiffness of left shoulder, not elsewhere classified  Muscle weakness (generalized)     Problem List Patient Active Problem List   Diagnosis Date Noted  . Ductal carcinoma in situ (DCIS) of left breast 12/27/2016  . Mastitis, left, acute 12/19/2012  . Malignant neoplasm of upper-outer quadrant of left breast in female, estrogen receptor positive ([place]) 09/15/2012  . Chest pain 05/28/2012  . Hypothyroidism 05/28/2012  . Hypertension 05/28/2012  . Hyperlipidemia 05/28/2012   Ruben Im, PT 01/07/18 12:16 PM Phone: [phone] Fax: [phone]  Alvera Singh 01/07/2018, 12:16 PM  [facility] [address], [place], [place], [address] Phone: [phone]   Fax:  [phone]  Name: Becky Montoya MRN: [id number] Date of Birth: [birth year]

## 2018-01-09 ENCOUNTER — Ambulatory Visit: Payer: Medicare Other

## 2018-01-09 DIAGNOSIS — M25512 Pain in left shoulder: Secondary | ICD-10-CM | POA: Diagnosis not present

## 2018-01-09 DIAGNOSIS — M6281 Muscle weakness (generalized): Secondary | ICD-10-CM

## 2018-01-09 DIAGNOSIS — M25612 Stiffness of left shoulder, not elsewhere classified: Secondary | ICD-10-CM

## 2018-01-09 NOTE — Therapy (Signed)
[facility] [address] [place], [place], [address] Phone: [phone]   Fax:  [phone]  Physical Therapy Treatment  Patient Details  Name: Becky Montoya MRN: [id number] Date of Birth: Nov 19, 1926 Referring Provider (PT): Dr. Tonita Cong   Encounter Date: 01/09/2018  PT End of Session - 01/09/18 0832    Visit Number  4    Date for PT Re-Evaluation  02/20/18    Authorization Type  Medicare    PT Start Time  0801    PT Stop Time  0840    PT Time Calculation (min)  39 min    Activity Tolerance  Patient tolerated treatment well    Behavior During Therapy  [facility] for tasks assessed/performed       Past Medical History:  Diagnosis Date  . Arthritis   . Breast cancer ([place])   . Hyperlipidemia   . Hypertension   . Osteoporosis   . Thyroid disease     Past Surgical History:  Procedure Laterality Date  . ABDOMINAL HYSTERECTOMY    . APPENDECTOMY    . BREAST LUMPECTOMY WITH NEEDLE LOCALIZATION Left 10/09/2012   Procedure: BREAST LUMPECTOMY WITH NEEDLE LOCALIZATION;  Surgeon: Merrie Roof, MD;  Location: [facility];  Service: General;  Laterality: Left;  needle localization at [facility]IS 7:30   . COLONOSCOPY    . fissures    . MOUTH SURGERY      There were no vitals filed for this visit.  Subjective Assessment - 01/09/18 0806    Subjective  Some of my exercises hurt so I didn't do them.  75% overall improvement in symptoms since the start of care.      Currently in Pain?  No/denies                       [facility] Adult PT Treatment/Exercise - 01/09/18 0001      Exercises   Exercises  Knee/Hip      Knee/Hip Exercises: Aerobic   Nustep  Level 2x 8 minutes -arms and legs      Shoulder Exercises: Supine   Protraction  AROM;Left;10 reps    Other Supine Exercises  bent arm raises 10x      Shoulder Exercises: Seated   Extension  Strengthening;Left;10 reps;Theraband    Theraband Level  (Shoulder Extension)  Level 1 (Yellow)    Row  Strengthening;Left;10 reps;Theraband    Theraband Level (Shoulder Row)  Level 1 (Yellow)    Other Seated Exercises  UE Ranger: flexion x 10, circles bil. x 20 each      Shoulder Exercises: Pulleys   Flexion  3 minutes      Shoulder Exercises: Isometric Strengthening   Flexion  5X5"    Extension  5X5"    External Rotation  5X5"    Internal Rotation  5X5"    ABduction  5X5"               PT Short Term Goals - 12/26/17 1351      PT SHORT TERM GOAL #1   Title  The patient will demonstrate awareness of basic self care strategies including postural correction and basic ex's to promote healing    Time  4    Period  Weeks    Status  New    Target Date  01/23/18      PT SHORT TERM GOAL #2   Title  The patient will report a 30% improvement in  left shoulder with reaching and dressing tasks and for using her rolling walker    Time  4    Period  Weeks    Status  New      PT SHORT TERM GOAL #3   Title  The patient will have improved left shoulder ROM flexion to 128 degrees needed for reaching overhead cabinets    Time  4    Period  Weeks    Status  New      PT SHORT TERM GOAL #4   Title  The patient will have improved shoulder internal and external rotation ROM to 60 degrees and behind the back to L3 needed for dressing    Time  4    Period  Weeks    Status  New        PT Long Term Goals - 12/26/17 1358      PT LONG TERM GOAL #1   Title  The patient will be independent in safe self progression of HEP     Time  8    Period  Weeks    Status  New    Target Date  02/20/18      PT LONG TERM GOAL #2   Title  The patient will report a 60% improvement in left shoulder pain with reaching, dressing tasks and for using her rolling walker    Time  8    Period  Weeks    Status  New      PT LONG TERM GOAL #3   Title  The patient will have improved left shoulder flexion to 132 degrees needed for reaching overhead shelves     Time  8    Period  Weeks    Status  New      PT LONG TERM GOAL #4   Title  The patient will have left shoulder strength to grossly 4-/5 to 4/5 needed for using carrying light objects    Time  8    Period  Weeks    Status  New      PT LONG TERM GOAL #5   Title  FOTO functional outcome score improved from 46% to 36% indicating improved function with less pain     Time  8    Period  Weeks    Status  New            Plan - 01/09/18 0807    Clinical Impression Statement  Pt reports 75% overall improvement in Lt shoulder pain since the start of care.  Pt denies any pain over the past week.  Pt reports pain with end range flexion exercises in supine.  Pt required tactile and verbal cues for technique with isometric exercises with review of HEP.  Pt with chronic Lt shoulder pain and weakness/limited A/ROM and will continue to benefit from skilled PT for Lt shoulder strength, flexibility and pain management as needed.      Clinical Impairments Affecting Rehab Potential  osteoporosis;  5 years ago left breast cancer (no U/S);  steroid injection early Sept    PT Frequency  2x / week    PT Duration  8 weeks    PT Treatment/Interventions  ADLs/Self Care Home Management;Cryotherapy;Electrical Stimulation;Moist Heat;Iontophoresis 4mg /ml Dexamethasone;Therapeutic exercise;Neuromuscular re-education;Patient/family education;Manual techniques;Dry needling;Taping    PT Next Visit Plan  review isometrics if needed,  add yellow band rows and extensions to HEP;  recheck shoulder ROM    PT Home Exercise Plan   Access Code: [email or web address]  Consulted and Agree with Plan of Care  Patient       Patient will benefit from skilled therapeutic intervention in order to improve the following deficits and impairments:  Pain, Decreased strength, Decreased range of motion, Impaired UE functional use  Visit Diagnosis: Acute pain of left shoulder  Stiffness of left shoulder, not elsewhere classified  Muscle weakness  (generalized)     Problem List Patient Active Problem List   Diagnosis Date Noted  . Ductal carcinoma in situ (DCIS) of left breast 12/27/2016  . Mastitis, left, acute 12/19/2012  . Malignant neoplasm of upper-outer quadrant of left breast in female, estrogen receptor positive ([place]) 09/15/2012  . Chest pain 05/28/2012  . Hypothyroidism 05/28/2012  . Hypertension 05/28/2012  . Hyperlipidemia 05/28/2012   Sigurd Sos, PT 01/09/18 8:42 AM  [facility] [address], [place], [place], [address] Phone: [phone]   Fax:  [phone]  Name: Becky Montoya MRN: [id number] Date of Birth: 12/15/1926

## 2018-01-14 ENCOUNTER — Encounter: Payer: Self-pay | Admitting: Physical Therapy

## 2018-01-14 ENCOUNTER — Ambulatory Visit: Payer: Medicare Other | Admitting: Physical Therapy

## 2018-01-14 DIAGNOSIS — M25512 Pain in left shoulder: Secondary | ICD-10-CM

## 2018-01-14 DIAGNOSIS — M6281 Muscle weakness (generalized): Secondary | ICD-10-CM

## 2018-01-14 DIAGNOSIS — M25612 Stiffness of left shoulder, not elsewhere classified: Secondary | ICD-10-CM

## 2018-01-14 NOTE — Patient Instructions (Signed)
Access Code: [email or web address]  URL: [email or web address]  Date: 01/14/2018  Prepared by: Ruben Im   Exercises  Supine Shoulder Flexion with Dowel - 5 reps - 1 sets - 2x daily - 7x weekly  Supine Shoulder Press with Dowel - 5 reps - 1 sets - 1x daily - 7x weekly  Seated Scapular Retraction - 5 reps - 1 sets - 1x daily - 7x weekly  Seated Shoulder Flexion Towel Slide at Table Top - 5 reps - 1 sets - 2x daily - 7x weekly  Seated Isometric Shoulder External Rotation - 5 reps - 1 sets - 5 hold - 2x daily - 7x weekly  Seated Isometric Shoulder Flexion - 5 reps - 1 sets - 5 hold - 1x daily - 7x weekly  Seated Isometric Shoulder Internal Rotation with Towel - 5 reps - 1 sets - 5 hold - 1x daily - 7x weekly  Seated Isometric Shoulder Extension - 5 reps - 1 sets - 5 hold - 1x daily - 7x weekly  Seated Isometric Shoulder Abduction - 5 reps - 1 sets - 5 hold - 1x daily - 7x weekly  Seated Shoulder Row with Anchored Resistance - 10 reps - 1 sets - 1x daily - 7x weekly  Shoulder extension with resistance - Neutral - 10 reps - 1 sets - 1x daily - 7x weekly  Seated Shoulder Internal Rotation with Anchored Resistance - 10 reps - 1 sets - 1x daily - 7x weekly  Seated Shoulder External Rotation with Resistance - 10 reps - 1 sets - 1x daily - 7x weekly

## 2018-01-14 NOTE — Therapy (Signed)
[facility] [address], [place], [place], [address] Phone: [phone]   Fax:  [phone]  Physical Therapy Treatment  Patient Details  Name: Becky Montoya MRN: [id number] Date of Birth: [birth year] Referring Provider (PT): Dr. Tonita Cong   Encounter Date: 01/14/2018  PT End of Session - 01/14/18 0939    Visit Number  5    Date for PT Re-Evaluation  02/20/18    Authorization Type  Medicare    PT Start Time  0935    PT Stop Time  1013    PT Time Calculation (min)  38 min    Activity Tolerance  Patient tolerated treatment well       Past Medical History:  Diagnosis Date  . Arthritis   . Breast cancer ([place])   . Hyperlipidemia   . Hypertension   . Osteoporosis   . Thyroid disease     Past Surgical History:  Procedure Laterality Date  . ABDOMINAL HYSTERECTOMY    . APPENDECTOMY    . BREAST LUMPECTOMY WITH NEEDLE LOCALIZATION Left 10/09/2012   Procedure: BREAST LUMPECTOMY WITH NEEDLE LOCALIZATION;  Surgeon: Merrie Roof, MD;  Location: [facility];  Service: General;  Laterality: Left;  needle localization at [facility] 7:30   . COLONOSCOPY    . fissures    . MOUTH SURGERY      There were no vitals filed for this visit.  Subjective Assessment - 01/14/18 0935    Subjective  My shoulder feels good.  I liked the Nu-Step.  I'm doing my home exercises.  I take my blouses off over my head now without pain.      Pertinent History  Left breast CA history 5 years ago;  needs hand held assist or walker for safety    Currently in Pain?  Yes    Pain Score  1    just a tiny twinge   Pain Location  Shoulder         [facility] PT Assessment - 01/14/18 0001      AROM   Left Shoulder Flexion  146 Degrees    Left Shoulder ABduction  165 Degrees    Left Shoulder Internal Rotation  --   T10 painful   Left Shoulder External Rotation  64 Degrees                   [facility] Adult PT Treatment/Exercise -  01/14/18 0001      Knee/Hip Exercises: Aerobic   Nustep  Level 2x 8 minutes -arms and legs      Shoulder Exercises: Supine   Protraction  AROM;Left;10 reps   1#   Other Supine Exercises  bent arm raises 10x 1#      Shoulder Exercises: Seated   Extension  Strengthening;Left;10 reps;Theraband    Theraband Level (Shoulder Extension)  Level 1 (Yellow)    Row  Strengthening;Left;10 reps;Theraband    Theraband Level (Shoulder Row)  Level 1 (Yellow)    External Rotation  Strengthening;Left;10 reps;Theraband    Theraband Level (Shoulder External Rotation)  Level 1 (Yellow)    Internal Rotation  Strengthening;Left;10 reps;Theraband    Theraband Level (Shoulder Internal Rotation)  Level 1 (Yellow)      Shoulder Exercises: Standing   Other Standing Exercises  UE Ranger on 1st and 2nd step 10x each       Shoulder Exercises: Pulleys   Flexion  3 minutes  PT Education - 01/14/18 1141    Education Details   Access Code: [email or web address]  yellow band rotation, rows, extensions    Person(s) Educated  Patient    Methods  Explanation;Demonstration;Handout    Comprehension  Returned demonstration;Verbalized understanding       PT Short Term Goals - 01/14/18 1729      PT SHORT TERM GOAL #1   Title  The patient will demonstrate awareness of basic self care strategies including postural correction and basic ex's to promote healing    Status  Achieved      PT SHORT TERM GOAL #2   Title  The patient will report a 30% improvement in left shoulder with reaching and dressing tasks and for using her rolling walker    Time  4    Period  Weeks    Status  On-going      PT SHORT TERM GOAL #3   Title  The patient will have improved left shoulder ROM flexion to 128 degrees needed for reaching overhead cabinets    Status  Achieved      PT SHORT TERM GOAL #4   Title  The patient will have improved shoulder internal and external rotation ROM to 60 degrees and behind the back to L3 needed for  dressing    Time  4    Period  Weeks    Status  On-going        PT Long Term Goals - 12/26/17 1358      PT LONG TERM GOAL #1   Title  The patient will be independent in safe self progression of HEP     Time  8    Period  Weeks    Status  New    Target Date  02/20/18      PT LONG TERM GOAL #2   Title  The patient will report a 60% improvement in left shoulder pain with reaching, dressing tasks and for using her rolling walker    Time  8    Period  Weeks    Status  New      PT LONG TERM GOAL #3   Title  The patient will have improved left shoulder flexion to 132 degrees needed for reaching overhead shelves    Time  8    Period  Weeks    Status  New      PT LONG TERM GOAL #4   Title  The patient will have left shoulder strength to grossly 4-/5 to 4/5 needed for using carrying light objects    Time  8    Period  Weeks    Status  New      PT LONG TERM GOAL #5   Title  FOTO functional outcome score improved from 46% to 36% indicating improved function with less pain     Time  8    Period  Weeks    Status  New            Plan - 01/14/18 8527    Clinical Impression Statement  The patient is able to add light resistance to supine incline exercises without exacerbation of shoulder pain.  Her shoulder ROM has improved by 10-15 degrees in all planes.  She reports good functional improvements as well with dressing.  Therapist closely monitoring response with exercises and providing hand held assist to walk in the clinic for safety.  Progressing with STGs.      Rehab Potential  Good  Clinical Impairments Affecting Rehab Potential  osteoporosis;  5 years ago left breast cancer (no U/S);  steroid injection early Sept    PT Frequency  2x / week    PT Duration  8 weeks    PT Treatment/Interventions  ADLs/Self Care Home Management;Cryotherapy;Electrical Stimulation;Moist Heat;Iontophoresis 4mg /ml Dexamethasone;Therapeutic exercise;Neuromuscular re-education;Patient/family  education;Manual techniques;Dry needling;Taping    PT Next Visit Plan  yellow band strengthening;  Nu-step;  ROM shoulder     PT Home Exercise Plan   Access Code: [email or web address] yellow band       Patient will benefit from skilled therapeutic intervention in order to improve the following deficits and impairments:  Pain, Decreased strength, Decreased range of motion, Impaired UE functional use  Visit Diagnosis: Acute pain of left shoulder  Stiffness of left shoulder, not elsewhere classified  Muscle weakness (generalized)     Problem List Patient Active Problem List   Diagnosis Date Noted  . Ductal carcinoma in situ (DCIS) of left breast 12/27/2016  . Mastitis, left, acute 12/19/2012  . Malignant neoplasm of upper-outer quadrant of left breast in female, estrogen receptor positive ([place]) 09/15/2012  . Chest pain 05/28/2012  . Hypothyroidism 05/28/2012  . Hypertension 05/28/2012  . Hyperlipidemia 05/28/2012   Ruben Im, PT 01/14/18 5:31 PM Phone: [phone] Fax: [phone]  Alvera Singh 01/14/2018, 5:30 PM  [facility] [address], [place], [place], [address] Phone: [phone]   Fax:  [phone]  Name: Becky Montoya MRN: [id number] Date of Birth: 1926-06-19

## 2018-01-16 ENCOUNTER — Inpatient Hospital Stay: Payer: Medicare Other | Attending: Oncology | Admitting: Adult Health

## 2018-01-16 ENCOUNTER — Encounter: Payer: Self-pay | Admitting: Adult Health

## 2018-01-16 VITALS — BP 147/60 | HR 78 | Temp 98.3°F | Resp 18 | Ht 63.0 in | Wt 128.8 lb

## 2018-01-16 DIAGNOSIS — Z853 Personal history of malignant neoplasm of breast: Secondary | ICD-10-CM | POA: Diagnosis not present

## 2018-01-16 DIAGNOSIS — I10 Essential (primary) hypertension: Secondary | ICD-10-CM | POA: Diagnosis not present

## 2018-01-16 DIAGNOSIS — Z170 Estrogen receptor positive status [ER+]: Secondary | ICD-10-CM

## 2018-01-16 DIAGNOSIS — C50412 Malignant neoplasm of upper-outer quadrant of left female breast: Secondary | ICD-10-CM

## 2018-01-16 NOTE — Progress Notes (Addendum)
ID: Becky Montoya OB: 02/08/1927  MR#: [id number]  KZS#:[id number]  PCP: Harlan Stains, MD GYN:   SUStar Age OTHER MD: Arloa Koh, Christene Slates  CHIEF COMPLAINT:  Left DCIS, estrogen receptor positive  CURRENT TREATMENT: Tamoxifen ending 12/2017    HISTORY OF PRESENT ILLNESS: From the original intake note:  Becky Montoya had routine screening mammography at [facility] 09/03/2012. There was a possible abnormality noted in the left breast and on 09/05/2012 she underwent left diagnostic mammography which confirmed an area of pleomorphic microcalcifications in the upper outer quadrant of the left breast. Biopsy of this area 09/10/2012 showed ([id number]) ductal carcinoma in situ, grade 3, estrogen receptor positive at 100%, progesterone receptor positive at 6%. Bilateral breast MRI 09/16/2012 showed a 2.9 cm area of clumped linear enhancement in the upper outer quadrant of the left breast but no additional areas of concern and no evidence of adenopathy. Incidental hepatic cysts or hemangiomas were noted in the liver.  The patient's subsequent history is as detailed below  INTERVAL HISTORY: Becky Montoya returns today for follow-up of her noninvasive breast cancer.. She is unaccompanied today.  She has stopped the Tamoxifen and is doing well with this.  She has completed her five years of adjuvant anti estrogen therapy.  REVIEW OF SYSTEMS: Becky Montoya is doing well.  She sees Dr. Dema Severin as her PCP.  She is up to date with skin cancer screening.  She is having trouble with her left leg and her left shoulder.  She is undergoing physical therapy.  She has some spinal cord stenosis.  She is using a walker and has stopped walking since then.  She lives at home with her husband.  She receives prolia every 6 months under the care of Dr. Dema Severin.      PAST MEDICAL HISTORY: Past Medical History:  Diagnosis Date  . Arthritis   . Breast cancer ([place])   . Hyperlipidemia   . Hypertension   . Osteoporosis   . Thyroid  disease     PAST SURGICAL HISTORY: Past Surgical History:  Procedure Laterality Date  . ABDOMINAL HYSTERECTOMY    . APPENDECTOMY    . BREAST LUMPECTOMY WITH NEEDLE LOCALIZATION Left 10/09/2012   Procedure: BREAST LUMPECTOMY WITH NEEDLE LOCALIZATION;  Surgeon: Merrie Roof, MD;  Location: [place];  Service: General;  Laterality: Left;  needle localization at SOLIS 7:30   . COLONOSCOPY    . fissures    . MOUTH SURGERY      FAMILY HISTORY Family History  Problem Relation Age of Onset  . Breast cancer Sister        dx in her 91s  . Cancer Maternal Grandmother        unknown form of cancer  . Heart attack Maternal Grandfather   . Cancer Cousin        maternal cousin with unknown form of cancer   the patient's father died from a stroke a bit of 85. The patient's mother died from a heart attack at the age of 39. The patient had one brother and 5 sisters. The patient has one sister with a history of breast cancer diagnosed at age 50 and the patient's mother's mother was diagnosed with some kind of cancer at the age of 84. There is no history of ovarian cancer in the family to the patient's knowledge.  GYNECOLOGIC HISTORY:  Menarche age 12, she is [place] P3, with her first live birth at age 89. She underwent hysterectomy with unilateral  salpingo-oophorectomy in 1973. She never used birth control.  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 Her husband W. Endya Austin, is a retired Cabin crew. Daughter Olevia Bowens lives in [place] and works as a Scientist, research (medical). Daughter Bethanne Ginger lives in [place] and work for [employer]. The patient had a son who did not survive. She has 4 grandchildren and 2 great-grandchildren. She has a third great-grandchild (a girl) due in February 2015. She attends a [employer].    ADVANCED DIRECTIVES:  These are in place and the patient's daughter Sharee Pimple is her  healthcare power of attorney. Sharee Pimple can be reached at [phone].   HEALTH MAINTENANCE: Social History   Tobacco Use  . Smoking status: Never Smoker  . Smokeless tobacco: Never Used  Substance Use Topics  . Alcohol use: No  . Drug use: No     Colonoscopy: January 2011  PAP:  Bone density: July 2013?  Lipid panel: Feb 2014  Allergies  Allergen Reactions  . Wellbutrin [Bupropion] Other (See Comments)    Nervousness    Current Outpatient Medications  Medication Sig Dispense Refill  . aspirin EC 81 MG tablet Take 81 mg by mouth every morning.    . Calcium-Vitamin D (CALTRATE 600 PLUS-VIT D PO) Take by mouth daily.    Marland Kitchen denosumab (PROLIA) 60 MG/ML SOLN injection Inject 60 mg into the skin every 6 (six) months. Administer in upper arm, thigh, or abdomen    . KRILL OIL PO Take by mouth daily.    Marland Kitchen levothyroxine (SYNTHROID, LEVOTHROID) 50 MCG tablet Take 50 mcg by mouth every morning.    Marland Kitchen LORazepam (ATIVAN) 0.5 MG tablet Take 0.5 mg by mouth every 8 (eight) hours as needed for anxiety.    Marland Kitchen losartan-hydrochlorothiazide (HYZAAR) 100-12.5 MG per tablet Take 1 tablet by mouth every morning.    . Misc Natural Products (LUTEIN 20) CAPS Take 1 capsule by mouth every morning.    . Multiple Vitamin (MULTIVITAMIN) tablet Take 1 tablet by mouth daily.    . NON FORMULARY 2 oz at bedtime. artho bonejoint    . simvastatin (ZOCOR) 40 MG tablet Take 40 mg by mouth every evening.    . tamoxifen (NOLVADEX) 20 MG tablet TAKE 1 TABLET BY MOUTH ONCE DAILY 90 tablet 1  . vitamin B-12 (CYANOCOBALAMIN) 1000 MCG tablet Take 1,000 mcg by mouth daily.    . vitamin E 400 UNIT capsule Take 400 Units by mouth every evening.     No current facility-administered medications for this visit.     OBJECTIVE: Vitals:   01/16/18 1514  BP: (!) 147/60  Pulse: 78  Resp: 18  Temp: 98.3 F (36.8 C)  SpO2: 100%     Body mass index is 22.82 kg/m.    ECOG FS: 1 Filed Weights   01/16/18 1514  Weight: 128 lb 12.8  oz (58.4 kg)   GENERAL: Patient is a well appearing female in no acute distress HEENT:  Sclerae anicteric.  Oropharynx clear and moist. No ulcerations or evidence of oropharyngeal candidiasis. Neck is supple.  NODES:  No cervical, supraclavicular, or axillary lymphadenopathy palpated.  BREAST EXAM:  Left breast s/p lumpectomy, no sign of recurrence, right breast benign LUNGS:  Clear to auscultation bilaterally.  No wheezes or rhonchi. HEART:  Regular rate and rhythm. No murmur appreciated. ABDOMEN:  Soft, nontender.  Positive, normoactive bowel sounds. No organomegaly palpated. MSK:  No focal spinal tenderness to  palpation. Full range of motion bilaterally in the upper extremities. EXTREMITIES:  No peripheral edema.   SKIN:  Clear with no obvious rashes or skin changes. No nail dyscrasia. NEURO:  Nonfocal. Well oriented.  Appropriate affect.    LAB RESULTS: Outside labs reviewed and separately scanned  CMP     Component Value Date/Time   NA 142 12/23/2014 1329   K 3.9 12/23/2014 1329   CL 102 10/08/2012 1254   CL 106 09/17/2012 0815   CO2 29 12/23/2014 1329   GLUCOSE 113 12/23/2014 1329   GLUCOSE 100 (H) 09/17/2012 0815   BUN 14.0 12/23/2014 1329   CREATININE 0.8 12/23/2014 1329   CALCIUM 9.3 12/23/2014 1329   PROT 6.3 (L) 12/23/2014 1329   ALBUMIN 3.7 12/23/2014 1329   AST 21 12/23/2014 1329   ALT 15 12/23/2014 1329   ALKPHOS 42 12/23/2014 1329   BILITOT 0.59 12/23/2014 1329   GFRNONAA 76 (L) 10/08/2012 1254   GFRAA 88 (L) 10/08/2012 1254    I No results found for: SPEP  Lab Results  Component Value Date   WBC 6.2 12/23/2014   NEUTROABS 3.5 12/23/2014   HGB 12.9 12/23/2014   HCT 37.5 12/23/2014   MCV 93.5 12/23/2014   PLT 113 (L) 12/23/2014      Chemistry      Component Value Date/Time   NA 142 12/23/2014 1329   K 3.9 12/23/2014 1329   CL 102 10/08/2012 1254   CL 106 09/17/2012 0815   CO2 29 12/23/2014 1329   BUN 14.0 12/23/2014 1329   CREATININE 0.8  12/23/2014 1329      Component Value Date/Time   CALCIUM 9.3 12/23/2014 1329   ALKPHOS 42 12/23/2014 1329   AST 21 12/23/2014 1329   ALT 15 12/23/2014 1329   BILITOT 0.59 12/23/2014 1329       STUDIES:    ASSESSMENT: 84 y.o. [place] woman   (1)  status post left upper outer quadrant lumpectomy 10/08/2012 for a 0.6 cm ductal carcinoma in situ, grade 2, estrogen receptor positive at 100%, progesterone receptor positive at 6%, with ample margins  (2) tamoxifen in July 2014 through 01/2018  PLAN: Becky Montoya is doing well today.  She is without any clinical or radiographic sign of recurrence. She has completed 5 years of anti estrogen therapy with Tamoxifen with good tolerance.  She will now "graduate" today and f/u with her PCP, Dr. Caren Griffins white for her surveillance.  She reviewed this plan with Dr. Jana Hakim.    I recommended she stay up to date with annual mammograms and skin cancer screening.  I recommended continued healthy diet and exercise as she is able.  She knows she can see [place] on an as needed basis for any questions or concerns that may arise.    Wilber Bihari, NP  01/16/18 3:19 PM Medical Oncology and Hematology [facility] [address] [place], [place] [address] Tel. [phone]    Fax. [phone]   ADDENDUM: Via is now a little over 5 years out from definitive surgery for her breast cancer with no evidence of disease recurrence.  This is very favorable.  She has completed 5 years of tamoxifen.  This means she has not only cut in half her risk of her breast cancer recurring but also her risk of developing another breast cancer in the future.  At this point I am comfortable releasing her to her primary care physician.  All she will need in terms of breast cancer follow-up  is her yearly mammography, preferably with tomography, and a yearly physician breast exam.  I will be glad to see her again at any point in the future if and when the need  arises, but as of now are making no further routine appointments for her here.  I personally saw this patient and performed a substantive portion of this encounter with the listed APP documented above.   Chauncey Cruel, MD Medical Oncology and Hematology [facility] [address] [place], [place] [address] Tel. [phone]    Fax. [phone]

## 2018-01-17 ENCOUNTER — Ambulatory Visit: Payer: Medicare Other | Admitting: Physical Therapy

## 2018-01-17 ENCOUNTER — Encounter: Payer: Self-pay | Admitting: Physical Therapy

## 2018-01-17 DIAGNOSIS — M25512 Pain in left shoulder: Secondary | ICD-10-CM | POA: Diagnosis not present

## 2018-01-17 DIAGNOSIS — M25612 Stiffness of left shoulder, not elsewhere classified: Secondary | ICD-10-CM

## 2018-01-17 DIAGNOSIS — M6281 Muscle weakness (generalized): Secondary | ICD-10-CM

## 2018-01-17 NOTE — Therapy (Signed)
[facility] [address], [place], [place], [address] Phone: [phone]   Fax:  [phone]  Physical Therapy Treatment  Patient Details  Name: Becky Montoya MRN: [id number] Date of Birth: Apr 16, 1926 Referring Provider (PT): Dr. Tonita Cong   Encounter Date: 01/17/2018  PT End of Session - 01/17/18 1004    Visit Number  6    Date for PT Re-Evaluation  02/20/18    Authorization Type  Medicare    PT Start Time  0936    PT Stop Time  1014    PT Time Calculation (min)  38 min    Activity Tolerance  Patient tolerated treatment well       Past Medical History:  Diagnosis Date  . Arthritis   . Breast cancer ([place])   . Hyperlipidemia   . Hypertension   . Osteoporosis   . Thyroid disease     Past Surgical History:  Procedure Laterality Date  . ABDOMINAL HYSTERECTOMY    . APPENDECTOMY    . BREAST LUMPECTOMY WITH NEEDLE LOCALIZATION Left 10/09/2012   Procedure: BREAST LUMPECTOMY WITH NEEDLE LOCALIZATION;  Surgeon: Merrie Roof, MD;  Location: [place];  Service: General;  Laterality: Left;  needle localization at SOLIS 7:30   . COLONOSCOPY    . fissures    . MOUTH SURGERY      There were no vitals filed for this visit.  Subjective Assessment - 01/17/18 0933    Subjective  Patient brought in her pink graduation tassel from the oncologist office for 5 years cancer free.   Shoulder a little more sore from doing both isometrics and band ex's.  Discussed doing just one type of strengthening per day but not both.      Pertinent History  Left breast CA history 5 years ago;  needs hand held assist or walker for safety    Currently in Pain?  Yes    Pain Score  2     Pain Location  Shoulder    Pain Orientation  Left    Pain Type  Acute pain    Aggravating Factors   a little bit pushing on walker;  lifting walker in/out of the car         [facility] PT Assessment - 01/17/18 0001      AROM   Left Shoulder  Flexion  150 Degrees    Left Shoulder ABduction  165 Degrees                   [facility] Adult PT Treatment/Exercise - 01/17/18 0001      Knee/Hip Exercises: Aerobic   Nustep  Level 2x 8 minutes -arms and legs   while discussing progress     Shoulder Exercises: Supine   Protraction  AROM;Left;10 reps   1#   Other Supine Exercises  bent arm raises 5x; 1# circles 2x10      Shoulder Exercises: Seated   Extension  Strengthening;Left;10 reps;Theraband    Theraband Level (Shoulder Extension)  Level 1 (Yellow)    Row  Strengthening;Left;10 reps;Theraband    Theraband Level (Shoulder Row)  Level 1 (Yellow)    External Rotation  Strengthening;Left;10 reps;Theraband    Theraband Level (Shoulder External Rotation)  Level 1 (Yellow)    Internal Rotation  Strengthening;Left;10 reps;Theraband    Theraband Level (Shoulder Internal Rotation)  Level 1 (Yellow)      Shoulder Exercises: Standing   Other Standing Exercises  slides on stair railing  5x; with 1# weight on railing 10x      Shoulder Exercises: Pulleys   Flexion  3 minutes      Shoulder Exercises: ROM/Strengthening   Pushups  10 reps    Other ROM/Strengthening Exercises  red ball roll up wall 6x      Manual Therapy   Manual therapy comments  P/ROM into ER, flexion and abduction to pt tolerance    Joint Mobilization  gentle inferior and posterior glenohumeral glides               PT Short Term Goals - 01/17/18 1012      PT SHORT TERM GOAL #1   Title  The patient will demonstrate awareness of basic self care strategies including postural correction and basic ex's to promote healing    Status  Achieved      PT SHORT TERM GOAL #2   Title  The patient will report a 30% improvement in left shoulder with reaching and dressing tasks and for using her rolling walker    Baseline  80%    Status  Achieved      PT SHORT TERM GOAL #3   Title  The patient will have improved left shoulder ROM flexion to 128 degrees needed  for reaching overhead cabinets    Status  Achieved      PT SHORT TERM GOAL #4   Title  The patient will have improved shoulder internal and external rotation ROM to 60 degrees and behind the back to L3 needed for dressing    Time  4    Period  Weeks        PT Long Term Goals - 01/17/18 1124      PT LONG TERM GOAL #1   Title  The patient will be independent in safe self progression of HEP     Time  8    Period  Weeks    Status  On-going      PT LONG TERM GOAL #2   Title  The patient will report a 60% improvement in left shoulder pain with reaching, dressing tasks and for using her rolling walker    Status  Achieved      PT LONG TERM GOAL #3   Title  The patient will have improved left shoulder flexion to 132 degrees needed for reaching overhead shelves    Status  Achieved      PT LONG TERM GOAL #4   Title  The patient will have left shoulder strength to grossly 4-/5 to 4/5 needed for using carrying light objects    Time  8    Period  Weeks    Status  On-going      PT LONG TERM GOAL #5   Title  FOTO functional outcome score improved from 46% to 36% indicating improved function with less pain     Time  8    Period  Weeks    Status  On-going            Plan - 01/17/18 1012    Clinical Impression Statement  Patient reports she is 80% better overall.  She reports minor shoulder discomfort today but is able to perform light strengthening without exacerbation of symptoms.  She is progressing with rehab goals and should meet remaining goals in 2-3 more visits.  Therapist closely monitoring response with all exercise and manual therapy.      Rehab Potential  Good    Clinical Impairments Affecting Rehab Potential  osteoporosis;  5 years ago left breast cancer (no U/S);  steroid injection early Sept    PT Frequency  2x / week    PT Duration  8 weeks    PT Treatment/Interventions  ADLs/Self Care Home Management;Cryotherapy;Electrical Stimulation;Moist Heat;Iontophoresis 4mg /ml  Dexamethasone;Therapeutic exercise;Neuromuscular re-education;Patient/family education;Manual techniques;Dry needling;Taping    PT Next Visit Plan  recheck ROM and remaining STGs next visit;  yellow band strengthening;  Nu-step;  ROM shoulder ;  ball roll on wall, railing slides    PT Home Exercise Plan   Access Code: [email or web address] yellow band    Consulted and Agree with Plan of Care  Patient       Patient will benefit from skilled therapeutic intervention in order to improve the following deficits and impairments:  Pain, Decreased strength, Decreased range of motion, Impaired UE functional use  Visit Diagnosis: Acute pain of left shoulder  Stiffness of left shoulder, not elsewhere classified  Muscle weakness (generalized)     Problem List Patient Active Problem List   Diagnosis Date Noted  . Ductal carcinoma in situ (DCIS) of left breast 12/27/2016  . Mastitis, left, acute 12/19/2012  . Malignant neoplasm of upper-outer quadrant of left breast in female, estrogen receptor positive ([place]) 09/15/2012  . Chest pain 05/28/2012  . Hypothyroidism 05/28/2012  . Hypertension 05/28/2012  . Hyperlipidemia 05/28/2012   Ruben Im, PT 01/17/18 11:26 AM Phone: [phone] Fax: [phone]  Alvera Singh 01/17/2018, 11:25 AM  [facility] [address] [place], [place], [address] Phone: [phone]   Fax:  [phone]  Name: AMORINA DOERR MRN: [id number] Date of Birth: 10-31-1926

## 2018-01-21 ENCOUNTER — Encounter: Payer: Self-pay | Admitting: Physical Therapy

## 2018-01-21 ENCOUNTER — Ambulatory Visit: Payer: Medicare Other | Admitting: Physical Therapy

## 2018-01-21 DIAGNOSIS — M25612 Stiffness of left shoulder, not elsewhere classified: Secondary | ICD-10-CM

## 2018-01-21 DIAGNOSIS — M25512 Pain in left shoulder: Secondary | ICD-10-CM | POA: Diagnosis not present

## 2018-01-21 DIAGNOSIS — M6281 Muscle weakness (generalized): Secondary | ICD-10-CM

## 2018-01-21 NOTE — Patient Instructions (Signed)
Access Code: [email or web address]  URL: [email or web address]  Date: 01/21/2018  Prepared by: Ruben Im   Exercises  Supine Shoulder Flexion with Dowel - 5 reps - 1 sets - 2x daily - 7x weekly  Supine Shoulder Press with Dowel - 5 reps - 1 sets - 1x daily - 7x weekly  Seated Scapular Retraction - 5 reps - 1 sets - 1x daily - 7x weekly  Seated Shoulder Flexion Towel Slide at Table Top - 5 reps - 1 sets - 2x daily - 7x weekly  Seated Isometric Shoulder External Rotation - 5 reps - 1 sets - 5 hold - 2x daily - 7x weekly  Seated Isometric Shoulder Flexion - 5 reps - 1 sets - 5 hold - 1x daily - 7x weekly  Seated Isometric Shoulder Internal Rotation with Towel - 5 reps - 1 sets - 5 hold - 1x daily - 7x weekly  Seated Isometric Shoulder Extension - 5 reps - 1 sets - 5 hold - 1x daily - 7x weekly  Seated Isometric Shoulder Abduction - 5 reps - 1 sets - 5 hold - 1x daily - 7x weekly  Seated Shoulder Row with Anchored Resistance - 10 reps - 1 sets - 1x daily - 7x weekly  Shoulder extension with resistance - Neutral - 10 reps - 1 sets - 1x daily - 7x weekly  Seated Shoulder Internal Rotation with Anchored Resistance - 10 reps - 1 sets - 1x daily - 7x weekly  Seated Shoulder External Rotation with Resistance - 10 reps - 1 sets - 1x daily - 7x weekly  Seated Shoulder Flexion with Self-Anchored Resistance - 10 reps - 1 sets - 1x daily - 7x weekly  Standing Shoulder Single Arm Flexion with Anchored Resistance - 10 reps - 1 sets - 1x daily - 7x weekly

## 2018-01-21 NOTE — Therapy (Signed)
[facility] [address], [place], [place], [address] Phone: [phone]   Fax:  [phone]  Physical Therapy Treatment  Patient Details  Name: Becky Montoya MRN: [id number] Date of Birth: 1926/05/20 Referring Provider (PT): Dr. Tonita Cong   Encounter Date: 01/21/2018  PT End of Session - 01/21/18 1013    Visit Number  7    Date for PT Re-Evaluation  02/20/18    Authorization Type  Medicare    PT Start Time  0932    PT Stop Time  1015    PT Time Calculation (min)  43 min    Activity Tolerance  Patient tolerated treatment well       Past Medical History:  Diagnosis Date  . Arthritis   . Breast cancer ([place])   . Hyperlipidemia   . Hypertension   . Osteoporosis   . Thyroid disease     Past Surgical History:  Procedure Laterality Date  . ABDOMINAL HYSTERECTOMY    . APPENDECTOMY    . BREAST LUMPECTOMY WITH NEEDLE LOCALIZATION Left 10/09/2012   Procedure: BREAST LUMPECTOMY WITH NEEDLE LOCALIZATION;  Surgeon: Merrie Roof, MD;  Location: [facility];  Service: General;  Laterality: Left;  needle localization at [facility] 7:30   . COLONOSCOPY    . fissures    . MOUTH SURGERY      There were no vitals filed for this visit.  Subjective Assessment - 01/21/18 0937    Subjective  Minimal pain in shoulder now.  I haven't lifted walker since Sunday.  I'm 75% better overall.    Pertinent History  Left breast CA history 5 years ago;  needs hand held assist or walker for safety         [facility] PT Assessment - 01/21/18 0001      Observation/Other Assessments   Focus on Therapeutic Outcomes (FOTO)   39% limitation       AROM   Left Shoulder Flexion  153 Degrees    Left Shoulder ABduction  165 Degrees    Left Shoulder Internal Rotation  --   L1   Left Shoulder External Rotation  65 Degrees      Strength   Left Shoulder Flexion  4/5    Left Shoulder Extension  4/5    Left Shoulder ABduction  4-/5    Left Shoulder Internal Rotation  4-/5    Left Shoulder External Rotation  4-/5                   [facility] Adult PT Treatment/Exercise - 01/21/18 0001      Therapeutic Activites    Therapeutic Activities  ADL's    ADL's  1# object reaching into cabinets 1st and 2nd shelves      Knee/Hip Exercises: Aerobic   Nustep  Level 2x 8 minutes -arms and legs   while discussing progress     Shoulder Exercises: Supine   Protraction  --   bent arm raises 1# 5x   Other Supine Exercises  extension isometric 10x      Shoulder Exercises: Seated   Extension  Strengthening;Left;15 reps;Theraband    Theraband Level (Shoulder Extension)  Level 1 (Yellow)    Row  Strengthening;Left;15 reps;Theraband    Theraband Level (Shoulder Row)  Level 1 (Yellow)    External Rotation  Strengthening;15 reps;Theraband    Theraband Level (Shoulder External Rotation)  Level 1 (Yellow)    Internal Rotation  Strengthening;Left;15 reps;Theraband  Theraband Level (Shoulder Internal Rotation)  Level 1 (Yellow)    Other Seated Exercises  yellow band flexion press 15x left only      Manual Therapy   Manual therapy comments  P/ROM into ER, flexion and abduction to pt tolerance    Joint Mobilization  gentle inferior and posterior glenohumeral glides             PT Education - 01/21/18 1009    Education Details   Access Code: [email or web address]     Person(s) Educated  Patient    Methods  Explanation;Demonstration;Handout    Comprehension  Returned demonstration;Verbalized understanding       PT Short Term Goals - 01/21/18 1229      PT SHORT TERM GOAL #1   Title  The patient will demonstrate awareness of basic self care strategies including postural correction and basic ex's to promote healing    Status  Achieved      PT SHORT TERM GOAL #2   Title  The patient will report a 30% improvement in left shoulder with reaching and dressing tasks and for using her rolling walker    Status  Achieved      PT SHORT  TERM GOAL #3   Title  The patient will have improved left shoulder ROM flexion to 128 degrees needed for reaching overhead cabinets    Status  Achieved      PT SHORT TERM GOAL #4   Title  The patient will have improved shoulder internal and external rotation ROM to 60 degrees and behind the back to L3 needed for dressing    Status  Achieved        PT Long Term Goals - 01/21/18 0950      PT LONG TERM GOAL #1   Title  The patient will be independent in safe self progression of HEP     Time  8    Period  Weeks    Status  On-going      PT LONG TERM GOAL #2   Title  The patient will report a 60% improvement in left shoulder pain with reaching, dressing tasks and for using her rolling walker    Status  Achieved      PT LONG TERM GOAL #3   Title  The patient will have improved left shoulder flexion to 132 degrees needed for reaching overhead shelves    Status  Achieved      PT LONG TERM GOAL #4   Title  The patient will have left shoulder strength to grossly 4-/5 to 4/5 needed for using carrying light objects    Status  Achieved      PT LONG TERM GOAL #5   Title  FOTO functional outcome score improved from 46% to 36% indicating improved function with less pain     Time  8    Period  Weeks    Status  On-going            Plan - 01/21/18 1005    Clinical Impression Statement  The patient has much improved shoulder ROM with less pain with functional activities.  Her FOTO functional outcome score has improved as well.  She demonstrates good compliance with her HEP and reviewed all resistive band exercises and added low level flexion.   Therapist monitoring response with exercises and providing cues for technique.  No pain with passive shoulder elevation.  She should meet majority of goals next visit and be ready for discharge from PT  to an independent HEP.      Rehab Potential  Good    Clinical Impairments Affecting Rehab Potential  osteoporosis;  5 years ago left breast cancer (no  U/S);  steroid injection early Sept    PT Frequency  2x / week    PT Duration  8 weeks    PT Treatment/Interventions  ADLs/Self Care Home Management;Cryotherapy;Electrical Stimulation;Moist Heat;Iontophoresis 88m/ml Dexamethasone;Therapeutic exercise;Neuromuscular re-education;Patient/family education;Manual techniques;Dry needling;Taping    PT Next Visit Plan  probable discharge next visit if majority of goals met    PT Home Exercise Plan   Access Code: [email or web address]    Consulted and Agree with Plan of Care  Patient       Patient will benefit from skilled therapeutic intervention in order to improve the following deficits and impairments:  Pain, Decreased strength, Decreased range of motion, Impaired UE functional use  Visit Diagnosis: Acute pain of left shoulder  Stiffness of left shoulder, not elsewhere classified  Muscle weakness (generalized)     Problem List Patient Active Problem List   Diagnosis Date Noted  . Ductal carcinoma in situ (DCIS) of left breast 12/27/2016  . Mastitis, left, acute 12/19/2012  . Malignant neoplasm of upper-outer quadrant of left breast in female, estrogen receptor positive ([place] 09/15/2012  . Chest pain 05/28/2012  . Hypothyroidism 05/28/2012  . Hypertension 05/28/2012  . Hyperlipidemia 05/28/2012   SRuben Im PT 01/21/18 12:30 PM Phone: [phone]: [phone] Singh10/15/2019, 12:30 PM  [facility] [address] [place] [address]Phone: [phone]  Fax:  [phone] Name: Becky VAILLANCOURTMRN: [id number] of Birth: [birth year]

## 2018-01-23 ENCOUNTER — Ambulatory Visit: Payer: Medicare Other | Admitting: Physical Therapy

## 2018-01-23 ENCOUNTER — Encounter: Payer: Self-pay | Admitting: Physical Therapy

## 2018-01-23 DIAGNOSIS — M25512 Pain in left shoulder: Secondary | ICD-10-CM

## 2018-01-23 DIAGNOSIS — M25612 Stiffness of left shoulder, not elsewhere classified: Secondary | ICD-10-CM

## 2018-01-23 DIAGNOSIS — M6281 Muscle weakness (generalized): Secondary | ICD-10-CM

## 2018-01-23 NOTE — Therapy (Signed)
[facility] [address] [place], [place], [address] Phone: [phone]   Fax:  [phone]  Physical Therapy Treatment/Discharge Summary   Patient Details  Name: Becky Montoya MRN: [id number] Date of Birth: 15-Oct-1926 Referring Provider (PT): Dr. Tonita Cong   Encounter Date: 01/23/2018  PT End of Session - 01/23/18 1357    Visit Number  8    Date for PT Re-Evaluation  02/20/18    Authorization Type  Medicare    PT Start Time  0850    PT Stop Time  0930    PT Time Calculation (min)  40 min    Activity Tolerance  Patient tolerated treatment well       Past Medical History:  Diagnosis Date  . Arthritis   . Breast cancer ([place])   . Hyperlipidemia   . Hypertension   . Osteoporosis   . Thyroid disease     Past Surgical History:  Procedure Laterality Date  . ABDOMINAL HYSTERECTOMY    . APPENDECTOMY    . BREAST LUMPECTOMY WITH NEEDLE LOCALIZATION Left 10/09/2012   Procedure: BREAST LUMPECTOMY WITH NEEDLE LOCALIZATION;  Surgeon: Merrie Roof, MD;  Location: [facility];  Service: General;  Laterality: Left;  needle localization at [facility] 7:30   . COLONOSCOPY    . fissures    . MOUTH SURGERY      There were no vitals filed for this visit.  Subjective Assessment - 01/23/18 0848    Subjective  No shoulder pain today.  Feels she is ready to be discharged from PT.      Pertinent History  Left breast CA history 5 years ago;  needs hand held assist or walker for safety    Currently in Pain?  No/denies    Pain Score  0-No pain    Pain Location  Shoulder    Pain Orientation  Left         [facility] PT Assessment - 01/23/18 0001      Observation/Other Assessments   Focus on Therapeutic Outcomes (FOTO)   39% limitation       AROM   Left Shoulder Flexion  153 Degrees    Left Shoulder ABduction  165 Degrees    Left Shoulder Internal Rotation  --   L1   Left Shoulder External Rotation  65 Degrees       Strength   Left Shoulder Flexion  4/5    Left Shoulder Extension  4/5    Left Shoulder ABduction  4/5    Left Shoulder Internal Rotation  4/5    Left Shoulder External Rotation  4/5                   [facility] Adult PT Treatment/Exercise - 01/23/18 0001      Knee/Hip Exercises: Aerobic   Nustep  Level 2x 6 minutes -arms and legs   while discussing progress     Shoulder Exercises: Supine   Protraction  --   bent arm raises 1# 5x     Shoulder Exercises: Sidelying   External Rotation  AROM;Left;10 reps      Shoulder Exercises: ROM/Strengthening   Other ROM/Strengthening Exercises  discussion on community options for exercise       Shoulder Exercises: Isometric Strengthening   Flexion  5X5"    Extension  5X5"    External Rotation  5X5"    Internal Rotation  5X5"    ABduction  5X5"  Review of HEP and discussion of safe self progression        PT Short Term Goals - 01/23/18 1407      PT SHORT TERM GOAL #1   Title  The patient will demonstrate awareness of basic self care strategies including postural correction and basic ex's to promote healing    Status  Achieved      PT SHORT TERM GOAL #2   Title  The patient will report a 30% improvement in left shoulder with reaching and dressing tasks and for using her rolling walker    Status  Achieved      PT SHORT TERM GOAL #3   Title  The patient will have improved left shoulder ROM flexion to 128 degrees needed for reaching overhead cabinets    Status  Achieved      PT SHORT TERM GOAL #4   Title  The patient will have improved shoulder internal and external rotation ROM to 60 degrees and behind the back to L3 needed for dressing    Status  Achieved        PT Long Term Goals - 01/23/18 1407      PT LONG TERM GOAL #1   Title  The patient will be independent in safe self progression of HEP     Status  Achieved      PT LONG TERM GOAL #2   Title  The patient will report a 60% improvement in left  shoulder pain with reaching, dressing tasks and for using her rolling walker    Status  Achieved      PT LONG TERM GOAL #3   Title  The patient will have improved left shoulder flexion to 132 degrees needed for reaching overhead shelves    Status  Achieved      PT LONG TERM GOAL #4   Title  The patient will have left shoulder strength to grossly 4-/5 to 4/5 needed for using carrying light objects    Status  Achieved      PT LONG TERM GOAL #5   Title  FOTO functional outcome score improved from 46% to 36% indicating improved function with less pain     Status  Partially Met            Plan - 01/23/18 1401    Clinical Impression Statement  The patient has made signficant improvements in shoulder ROM and reduction in pain.  She has been instructed in a comprehensive HEP for ROM and strengthening isometrically and using a yellow resistive band.  We have discussed community [employer] options including [employer] program and [employer].  She has met the majority of rehab goals and will discharge from PT at this time.  The patient is pleased with her current functional level.      Rehab Potential  Good    Clinical Impairments Affecting Rehab Potential  osteoporosis;  5 years ago left breast cancer (no U/S);  steroid injection early Sept    PT Frequency  2x / week    PT Duration  8 weeks    PT Treatment/Interventions  ADLs/Self Care Home Management;Cryotherapy;Electrical Stimulation;Moist Heat;Iontophoresis 61m/ml Dexamethasone;Therapeutic exercise;Neuromuscular re-education;Patient/family education;Manual techniques;Dry needling;Taping    PT Home Exercise Plan   Access Code: [email or web address]         PHYSICAL THERAPY DISCHARGE SUMMARY  Visits from Start of Care: 8  Current functional level related to goals / functional outcomes: See clinical impressions above   Remaining deficits:  As above    Education / Equipment: Comprehensive HEP Plan: Patient agrees to  discharge.  Patient goals were met. Patient is being discharged due to meeting the stated rehab goals.  ?????     Patient will benefit from skilled therapeutic intervention in order to improve the following deficits and impairments:  Pain, Decreased strength, Decreased range of motion, Impaired UE functional use  Visit Diagnosis: Acute pain of left shoulder  Stiffness of left shoulder, not elsewhere classified  Muscle weakness (generalized)     Problem List Patient Active Problem List   Diagnosis Date Noted  . Ductal carcinoma in situ (DCIS) of left breast 12/27/2016  . Mastitis, left, acute 12/19/2012  . Malignant neoplasm of upper-outer quadrant of left breast in female, estrogen receptor positive ([place]) 09/15/2012  . Chest pain 05/28/2012  . Hypothyroidism 05/28/2012  . Hypertension 05/28/2012  . Hyperlipidemia 05/28/2012   Ruben Im, PT 01/23/18 2:10 PM Phone: [phone] Fax: [phone]  Alvera Singh 01/23/2018, Howie Ill PM  [facility] [address] [place], [place], [address] Phone: [phone]   Fax:  [phone]  Name: Becky Montoya MRN: [id number] Date of Birth: 02/15/1927

## 2018-01-24 ENCOUNTER — Encounter: Payer: Medicare Other | Admitting: Physical Therapy

## 2018-01-30 ENCOUNTER — Encounter: Payer: Medicare Other | Admitting: Physical Therapy

## 2018-02-06 ENCOUNTER — Encounter: Payer: Medicare Other | Admitting: Physical Therapy

## 2018-02-11 ENCOUNTER — Encounter: Payer: Medicare Other | Admitting: Physical Therapy

## 2018-02-13 ENCOUNTER — Encounter: Payer: Medicare Other | Admitting: Physical Therapy

## 2018-02-18 ENCOUNTER — Encounter: Payer: Medicare Other | Admitting: Physical Therapy

## 2018-02-20 ENCOUNTER — Ambulatory Visit: Payer: Medicare Other | Admitting: Physical Therapy

## 2018-05-06 ENCOUNTER — Other Ambulatory Visit: Payer: Self-pay | Admitting: Orthopedic Surgery

## 2018-05-06 DIAGNOSIS — M48061 Spinal stenosis, lumbar region without neurogenic claudication: Secondary | ICD-10-CM

## 2018-05-14 ENCOUNTER — Ambulatory Visit
Admission: RE | Admit: 2018-05-14 | Discharge: 2018-05-14 | Disposition: A | Discharge status: Home / Self Care | Payer: Medicare Other | Source: Ambulatory Visit | Attending: Orthopedic Surgery | Admitting: Orthopedic Surgery

## 2018-05-14 DIAGNOSIS — M48061 Spinal stenosis, lumbar region without neurogenic claudication: Secondary | ICD-10-CM

## 2018-05-14 MED ORDER — IOPAMIDOL (ISOVUE-M 200) INJECTION 41%
20.0000 mL | Freq: Once | INTRAMUSCULAR | Status: AC
  Administered 2018-05-14: 20 mL via INTRATHECAL

## 2018-05-14 MED ORDER — DIAZEPAM 5 MG PO TABS
5.0000 mg | ORAL_TABLET | Freq: Once | ORAL | Status: AC
  Administered 2018-05-14: 5 mg via ORAL

## 2018-05-14 NOTE — Discharge Instructions (Signed)

## 2018-10-18 ENCOUNTER — Inpatient Hospital Stay (HOSPITAL_COMMUNITY)
Admission: EM | Admit: 2018-10-18 | Discharge: 2018-10-24 | DRG: 481 | Disposition: A | Discharge status: Skilled Nursing Facility | Payer: Medicare Other | Attending: Family Medicine | Admitting: Family Medicine

## 2018-10-18 ENCOUNTER — Emergency Department (HOSPITAL_COMMUNITY): Payer: Medicare Other

## 2018-10-18 ENCOUNTER — Encounter (HOSPITAL_COMMUNITY): Payer: Self-pay | Admitting: Emergency Medicine

## 2018-10-18 ENCOUNTER — Other Ambulatory Visit: Payer: Self-pay

## 2018-10-18 ENCOUNTER — Inpatient Hospital Stay (HOSPITAL_COMMUNITY): Payer: Medicare Other

## 2018-10-18 DIAGNOSIS — Z96649 Presence of unspecified artificial hip joint: Secondary | ICD-10-CM

## 2018-10-18 DIAGNOSIS — W19XXXA Unspecified fall, initial encounter: Secondary | ICD-10-CM

## 2018-10-18 DIAGNOSIS — M1990 Unspecified osteoarthritis, unspecified site: Secondary | ICD-10-CM | POA: Diagnosis present

## 2018-10-18 DIAGNOSIS — Y92000 Kitchen of unspecified non-institutional (private) residence as  the place of occurrence of the external cause: Secondary | ICD-10-CM | POA: Diagnosis not present

## 2018-10-18 DIAGNOSIS — W010XXA Fall on same level from slipping, tripping and stumbling without subsequent striking against object, initial encounter: Secondary | ICD-10-CM | POA: Diagnosis present

## 2018-10-18 DIAGNOSIS — I10 Essential (primary) hypertension: Secondary | ICD-10-CM | POA: Diagnosis present

## 2018-10-18 DIAGNOSIS — D62 Acute posthemorrhagic anemia: Secondary | ICD-10-CM | POA: Diagnosis not present

## 2018-10-18 DIAGNOSIS — Z8781 Personal history of (healed) traumatic fracture: Secondary | ICD-10-CM | POA: Diagnosis not present

## 2018-10-18 DIAGNOSIS — Z888 Allergy status to other drugs, medicaments and biological substances status: Secondary | ICD-10-CM

## 2018-10-18 DIAGNOSIS — M810 Age-related osteoporosis without current pathological fracture: Secondary | ICD-10-CM | POA: Diagnosis present

## 2018-10-18 DIAGNOSIS — Z1159 Encounter for screening for other viral diseases: Secondary | ICD-10-CM | POA: Diagnosis not present

## 2018-10-18 DIAGNOSIS — E876 Hypokalemia: Secondary | ICD-10-CM | POA: Diagnosis not present

## 2018-10-18 DIAGNOSIS — Z8249 Family history of ischemic heart disease and other diseases of the circulatory system: Secondary | ICD-10-CM | POA: Diagnosis not present

## 2018-10-18 DIAGNOSIS — S7222XA Displaced subtrochanteric fracture of left femur, initial encounter for closed fracture: Principal | ICD-10-CM | POA: Diagnosis present

## 2018-10-18 DIAGNOSIS — E039 Hypothyroidism, unspecified: Secondary | ICD-10-CM | POA: Diagnosis present

## 2018-10-18 DIAGNOSIS — K219 Gastro-esophageal reflux disease without esophagitis: Secondary | ICD-10-CM | POA: Diagnosis present

## 2018-10-18 DIAGNOSIS — T50995A Adverse effect of other drugs, medicaments and biological substances, initial encounter: Secondary | ICD-10-CM | POA: Diagnosis present

## 2018-10-18 DIAGNOSIS — Z01818 Encounter for other preprocedural examination: Secondary | ICD-10-CM | POA: Diagnosis present

## 2018-10-18 DIAGNOSIS — S7292XA Unspecified fracture of left femur, initial encounter for closed fracture: Secondary | ICD-10-CM

## 2018-10-18 DIAGNOSIS — E785 Hyperlipidemia, unspecified: Secondary | ICD-10-CM | POA: Diagnosis present

## 2018-10-18 DIAGNOSIS — Z803 Family history of malignant neoplasm of breast: Secondary | ICD-10-CM | POA: Diagnosis not present

## 2018-10-18 DIAGNOSIS — Z79890 Hormone replacement therapy (postmenopausal): Secondary | ICD-10-CM

## 2018-10-18 DIAGNOSIS — Z90710 Acquired absence of both cervix and uterus: Secondary | ICD-10-CM

## 2018-10-18 DIAGNOSIS — Z7982 Long term (current) use of aspirin: Secondary | ICD-10-CM

## 2018-10-18 DIAGNOSIS — Z79899 Other long term (current) drug therapy: Secondary | ICD-10-CM | POA: Diagnosis not present

## 2018-10-18 DIAGNOSIS — Z853 Personal history of malignant neoplasm of breast: Secondary | ICD-10-CM

## 2018-10-18 DIAGNOSIS — M25551 Pain in right hip: Secondary | ICD-10-CM | POA: Diagnosis not present

## 2018-10-18 DIAGNOSIS — T148XXA Other injury of unspecified body region, initial encounter: Secondary | ICD-10-CM

## 2018-10-18 DIAGNOSIS — Y92009 Unspecified place in unspecified non-institutional (private) residence as the place of occurrence of the external cause: Secondary | ICD-10-CM

## 2018-10-18 LAB — CBC WITH DIFFERENTIAL/PLATELET
Abs Immature Granulocytes: 0.02 10*3/uL (ref 0.00–0.07)
Basophils Absolute: 0.1 10*3/uL (ref 0.0–0.1)
Basophils Relative: 1 %
Eosinophils Absolute: 0.1 10*3/uL (ref 0.0–0.5)
Eosinophils Relative: 1 %
HCT: 42.6 % (ref 36.0–46.0)
Hemoglobin: 13.6 g/dL (ref 12.0–15.0)
Immature Granulocytes: 0 %
Lymphocytes Relative: 24 %
Lymphs Abs: 1.7 10*3/uL (ref 0.7–4.0)
MCH: 31.5 pg (ref 26.0–34.0)
MCHC: 31.9 g/dL (ref 30.0–36.0)
MCV: 98.6 fL (ref 80.0–100.0)
Monocytes Absolute: 0.6 10*3/uL (ref 0.1–1.0)
Monocytes Relative: 9 %
Neutro Abs: 4.4 10*3/uL (ref 1.7–7.7)
Neutrophils Relative %: 65 %
Platelets: 180 10*3/uL (ref 150–400)
RBC: 4.32 MIL/uL (ref 3.87–5.11)
RDW: 12.1 % (ref 11.5–15.5)
WBC: 6.8 10*3/uL (ref 4.0–10.5)
nRBC: 0 % (ref 0.0–0.2)

## 2018-10-18 LAB — TYPE AND SCREEN
ABO/RH(D): A NEG
Antibody Screen: NEGATIVE

## 2018-10-18 LAB — BASIC METABOLIC PANEL
Anion gap: 13 (ref 5–15)
BUN: 13 mg/dL (ref 8–23)
CO2: 25 mmol/L (ref 22–32)
Calcium: 9.2 mg/dL (ref 8.9–10.3)
Chloride: 101 mmol/L (ref 98–111)
Creatinine, Ser: 0.64 mg/dL (ref 0.44–1.00)
GFR calc Af Amer: >60 mL/min (ref >60)
GFR calc non Af Amer: >60 mL/min (ref >60)
Glucose, Bld: 136 mg/dL — ABNORMAL HIGH (ref 70–99)
Potassium: 3.6 mmol/L (ref 3.5–5.1)
Sodium: 139 mmol/L (ref 135–145)

## 2018-10-18 LAB — SARS CORONAVIRUS 2 BY RT PCR (HOSPITAL ORDER, PERFORMED IN ~~LOC~~ HOSPITAL LAB): SARS Coronavirus 2: NEGATIVE

## 2018-10-18 MED ORDER — SIMVASTATIN 20 MG PO TABS
40.0000 mg | ORAL_TABLET | Freq: Every evening | ORAL | Status: DC
  Administered 2018-10-19 – 2018-10-23 (×6): 40 mg via ORAL
  Filled 2018-10-18 (×5): qty 2
  Filled 2018-10-18: qty 1
  Filled 2018-10-18: qty 2

## 2018-10-18 MED ORDER — FENTANYL CITRATE (PF) 100 MCG/2ML IJ SOLN
50.0000 ug | Freq: Once | INTRAMUSCULAR | Status: AC
  Filled 2018-10-18 (×2): qty 2

## 2018-10-18 MED ORDER — ENOXAPARIN SODIUM 40 MG/0.4ML ~~LOC~~ SOLN: 40.0000 mg | SUBCUTANEOUS | Status: DC

## 2018-10-18 MED ORDER — FENTANYL CITRATE (PF) 100 MCG/2ML IJ SOLN
50.0000 ug | Freq: Once | INTRAMUSCULAR | Status: AC
  Administered 2018-10-18: 50 ug via INTRAVENOUS
  Filled 2018-10-18: qty 2

## 2018-10-18 MED ORDER — LACTATED RINGERS IV SOLN
INTRAVENOUS | Status: AC | PRN
  Administered 2018-10-18: 75 mL/h via INTRAVENOUS

## 2018-10-18 MED ORDER — LOSARTAN POTASSIUM-HCTZ 100-12.5 MG PO TABS: 1.0000 | ORAL_TABLET | Freq: Every morning | ORAL | Status: DC

## 2018-10-18 MED ORDER — FENTANYL CITRATE (PF) 100 MCG/2ML IJ SOLN
INTRAMUSCULAR | Status: AC | PRN
  Administered 2018-10-18: 50 ug via INTRAVENOUS

## 2018-10-18 MED ORDER — LACTATED RINGERS IV SOLN
INTRAVENOUS | Status: DC
  Administered 2018-10-18: via INTRAVENOUS

## 2018-10-18 MED ORDER — SODIUM CHLORIDE 0.9 % IV BOLUS: 250.0000 mL | Freq: Once | INTRAVENOUS | Status: DC

## 2018-10-18 MED ORDER — METHOCARBAMOL 1000 MG/10ML IJ SOLN: 500.0000 mg | Freq: Four times a day (QID) | INTRAVENOUS | Status: DC | PRN

## 2018-10-18 MED ORDER — ZOLPIDEM TARTRATE 5 MG PO TABS
5.0000 mg | ORAL_TABLET | Freq: Every evening | ORAL | Status: DC | PRN
  Administered 2018-10-19 – 2018-10-23 (×4): 5 mg via ORAL
  Filled 2018-10-18 (×4): qty 1

## 2018-10-18 MED ORDER — MORPHINE SULFATE (PF) 2 MG/ML IV SOLN: 0.5000 mg | INTRAVENOUS | Status: DC | PRN

## 2018-10-18 MED ORDER — HYDROCODONE-ACETAMINOPHEN 5-325 MG PO TABS
1.0000 | ORAL_TABLET | Freq: Four times a day (QID) | ORAL | Status: DC | PRN
  Administered 2018-10-19 – 2018-10-24 (×8): 1 via ORAL
  Filled 2018-10-18 (×10): qty 1

## 2018-10-18 MED ORDER — MIDAZOLAM HCL 2 MG/2ML IJ SOLN
INTRAMUSCULAR | Status: AC | PRN
  Administered 2018-10-18: 1 mg via INTRAVENOUS

## 2018-10-18 MED ORDER — LEVOTHYROXINE SODIUM 50 MCG PO TABS
50.0000 ug | ORAL_TABLET | Freq: Every day | ORAL | Status: DC
  Administered 2018-10-19 – 2018-10-24 (×6): 50 ug via ORAL
  Filled 2018-10-18 (×6): qty 1

## 2018-10-18 MED ORDER — VITAMIN B-12 1000 MCG PO TABS
1000.0000 ug | ORAL_TABLET | Freq: Every day | ORAL | Status: DC
  Administered 2018-10-20 – 2018-10-24 (×5): 1000 ug via ORAL
  Filled 2018-10-18 (×5): qty 1

## 2018-10-18 MED ORDER — LOSARTAN POTASSIUM 50 MG PO TABS
100.0000 mg | ORAL_TABLET | Freq: Every day | ORAL | Status: DC
  Administered 2018-10-20 – 2018-10-24 (×5): 100 mg via ORAL
  Filled 2018-10-18 (×5): qty 2

## 2018-10-18 MED ORDER — HYDROCHLOROTHIAZIDE 12.5 MG PO CAPS
12.5000 mg | ORAL_CAPSULE | Freq: Every day | ORAL | Status: DC
  Administered 2018-10-20 – 2018-10-24 (×5): 12.5 mg via ORAL
  Filled 2018-10-18 (×5): qty 1

## 2018-10-18 MED ORDER — MIDAZOLAM HCL 2 MG/2ML IJ SOLN
2.0000 mg | Freq: Once | INTRAMUSCULAR | Status: DC
  Filled 2018-10-18: qty 2

## 2018-10-18 MED ORDER — METHOCARBAMOL 500 MG PO TABS
500.0000 mg | ORAL_TABLET | Freq: Four times a day (QID) | ORAL | Status: DC | PRN
  Administered 2018-10-19: 500 mg via ORAL
  Filled 2018-10-18: qty 1

## 2018-10-18 MED ORDER — MIDAZOLAM HCL (PF) 2 MG/2ML IJ SOLN: 0.1000 mg/kg | Freq: Once | INTRAMUSCULAR | Status: DC

## 2018-10-18 MED ORDER — SENNA 8.6 MG PO TABS
1.0000 | ORAL_TABLET | Freq: Two times a day (BID) | ORAL | Status: DC
  Administered 2018-10-19 – 2018-10-23 (×8): 8.6 mg via ORAL
  Filled 2018-10-18 (×10): qty 1

## 2018-10-18 NOTE — ED Notes (Signed)
Hospitalist at bedside 

## 2018-10-18 NOTE — ED Notes (Signed)
Ortho paged for knee immobilizer and traction.

## 2018-10-18 NOTE — ED Triage Notes (Addendum)
Per EMS, patient from home, reports slip and fall walking in kitchen. C/o left hip pain since the time of fall. Deformity noted. Denies head injury and LOC.  Denies taking blood thinners.  100mcg Fentanyl IM  18mcg Fentanyl IN

## 2018-10-18 NOTE — ED Notes (Signed)
ED TO INPATIENT HANDOFF REPORT  Name/Age/Gender Becky Montoya 85 y.o. female  Code Status    Code Status Orders  (From admission, onward)         Start     Ordered   10/18/18 1821  Full code  Continuous     10/18/18 1832        Code Status History    Date Active Date Inactive Code Status Order ID Comments User Context   05/28/2012 2312 05/29/2012 2208 Full Code [id number]  Velvet Bathe, MD Inpatient   Advance Care Planning Activity    Advance Directive Documentation     Most Recent Value  Type of Advance Directive  Healthcare Power of Attorney, Living will  Pre-existing out of facility DNR order (yellow form or pink MOST form)  -  "MOST" Form in Place?  -      Home/SNF/Other Home  Chief Complaint Lt hip injury, fall  Level of Care/Admitting Diagnosis ED Disposition    ED Disposition Condition [place]: [place] [[id number]]  Level of Care: Med-Surg [16]  Covid Evaluation: Asymptomatic Screening Protocol (No Symptoms)  Diagnosis: Closed left subtrochanteric femur fracture [facility]) [[id number]]  Admitting Physician: Neena Rhymes [[id number]]  Attending Physician: Adella Hare E [[id number]]  Estimated length of stay: 3 - 4 days  Certification:: I certify this patient will need inpatient services for at least 2 midnights  PT Class (Do Not Modify): Inpatient [101]  PT Acc Code (Do Not Modify): Private [1]       Medical History Past Medical History:  Diagnosis Date  . Arthritis   . Breast cancer ([place])   . Hyperlipidemia   . Hypertension   . Osteoporosis   . Thyroid disease     Allergies Allergies  Allergen Reactions  . Wellbutrin [Bupropion] Other (See Comments)    Nervousness    IV Location/Drains/Wounds Patient Lines/Drains/Airways Status   Active Line/Drains/Airways    Name:   Placement date:   Placement time:   Site:   Days:   Peripheral IV 05/28/12 Right Antecubital   05/28/12    1927    Antecubital   2334   Peripheral IV 10/18/18 Left;Upper Arm   10/18/18    1743    Arm   less than 1   Incision 10/09/12 Breast Left   10/09/12    1001     2200          Labs/Imaging Results for orders placed or performed during the hospital encounter of 10/18/18 (from the past 48 hour(s))  CBC with Differential     Status: None   Collection Time: 10/18/18  4:45 PM  Result Value Ref Range   WBC 6.8 4.0 - 10.5 K/uL   RBC 4.32 3.87 - 5.11 MIL/uL   Hemoglobin 13.6 12.0 - 15.0 g/dL   HCT 42.6 36.0 - 46.0 %   MCV 98.6 80.0 - 100.0 fL   MCH 31.5 26.0 - 34.0 pg   MCHC 31.9 30.0 - 36.0 g/dL   RDW 12.1 11.5 - 15.5 %   Platelets 180 150 - 400 K/uL   nRBC 0.0 0.0 - 0.2 %   Neutrophils Relative % 65 %   Neutro Abs 4.4 1.7 - 7.7 K/uL   Lymphocytes Relative 24 %   Lymphs Abs 1.7 0.7 - 4.0 K/uL   Monocytes Relative 9 %   Monocytes Absolute 0.6 0.1 - 1.0 K/uL   Eosinophils Relative 1 %   Eosinophils  Absolute 0.1 0.0 - 0.5 K/uL   Basophils Relative 1 %   Basophils Absolute 0.1 0.0 - 0.1 K/uL   Immature Granulocytes 0 %   Abs Immature Granulocytes 0.02 0.00 - 0.07 K/uL    Comment: Performed at [facility], [place] [address] [place], [place] [address]  SARS Coronavirus 2 (CEPHEID - Performed in [place] hospital lab), Hosp Order     Status: None   Collection Time: 10/18/18  5:12 PM   Specimen: Nasopharyngeal Swab  Result Value Ref Range   SARS Coronavirus 2 NEGATIVE NEGATIVE    Comment: (NOTE) If result is NEGATIVE SARS-CoV-2 target nucleic acids are NOT DETECTED. The SARS-CoV-2 RNA is generally detectable in upper and lower  respiratory specimens during the acute phase of infection. The lowest  concentration of SARS-CoV-2 viral copies this assay can detect is 250  copies / mL. A negative result does not preclude SARS-CoV-2 infection  and should not be used as the sole basis for treatment or other  patient management decisions.  A negative result may occur with  improper specimen  collection / handling, submission of specimen other  than nasopharyngeal swab, presence of viral mutation(s) within the  areas targeted by this assay, and inadequate number of viral copies  (<250 copies / mL). A negative result must be combined with clinical  observations, patient history, and epidemiological information. If result is POSITIVE SARS-CoV-2 target nucleic acids are DETECTED. The SARS-CoV-2 RNA is generally detectable in upper and lower  respiratory specimens dur ing the acute phase of infection.  Positive  results are indicative of active infection with SARS-CoV-2.  Clinical  correlation with patient history and other diagnostic information is  necessary to determine patient infection status.  Positive results do  not rule out bacterial infection or co-infection with other viruses. If result is PRESUMPTIVE POSTIVE SARS-CoV-2 nucleic acids MAY BE PRESENT.   A presumptive positive result was obtained on the submitted specimen  and confirmed on repeat testing.  While 2019 novel coronavirus  (SARS-CoV-2) nucleic acids may be present in the submitted sample  additional confirmatory testing may be necessary for epidemiological  and / or clinical management purposes  to differentiate between  SARS-CoV-2 and other Sarbecovirus currently known to infect humans.  If clinically indicated additional testing with an alternate test  methodology [phone]) is advised. The SARS-CoV-2 RNA is generally  detectable in upper and lower respiratory sp ecimens during the acute  phase of infection. The expected result is Negative. Fact Sheet for Patients:  StrictlyIdeas.no Fact Sheet for Healthcare Providers: BankingDealers.co.za This test is not yet approved or cleared by the [place] FDA and has been authorized for detection and/or diagnosis of SARS-CoV-2 by FDA under an Emergency Use Authorization (EUA).  This EUA will remain in effect  (meaning this test can be used) for the duration of the COVID-19 declaration under Section 564(b)(1) of the Act, 21 U.S.C. section 360bbb-3(b)(1), unless the authorization is terminated or revoked sooner. Performed at [facility], [place] [address], [place], [place] [address]   Basic metabolic panel     Status: Abnormal   Collection Time: 10/18/18  5:35 PM  Result Value Ref Range   Sodium 139 135 - 145 mmol/L   Potassium 3.6 3.5 - 5.1 mmol/L   Chloride 101 98 - 111 mmol/L   CO2 25 22 - 32 mmol/L   Glucose, Bld 136 (H) 70 - 99 mg/dL   BUN 13 8 - 23 mg/dL  Creatinine, Ser 0.64 0.44 - 1.00 mg/dL   Calcium 9.2 8.9 - 10.3 mg/dL   GFR calc non Af Amer >60 >60 mL/min   GFR calc Af Amer >60 >60 mL/min   Anion gap 13 5 - 15    Comment: Performed at [facility], [place] [address], [place], [place] [address]   Chest Portable 1 View  Result Date: 10/18/2018 CLINICAL DATA:  Preoperative study.  Known hip fracture. EXAM: PORTABLE CHEST 1 VIEW COMPARISON:  None. FINDINGS: The heart size and mediastinal contours are within normal limits. Both lungs are clear. The visualized skeletal structures are unremarkable. IMPRESSION: No active disease. Electronically Signed   By: Dorise Bullion III M.D   On: 10/18/2018 19:06   Dg Hip Unilat With Pelvis 2-3 Views Left  Result Date: 10/18/2018 CLINICAL DATA:  Left hip pain post fall. EXAM: DG HIP (WITH OR WITHOUT PELVIS) 2-3V LEFT COMPARISON:  None. FINDINGS: Transverse fracture of the left proximal femur, just distal to the trochanteric region. The left hip is located with varus rotation of the proximal fracture fragment, and superior displacement of the distal fracture fragment. IMPRESSION: Displaced transverse fracture of the left proximal femur. Electronically Signed   By: Fidela Salisbury M.D.   On: 10/18/2018 17:26   Dg Femur Min 2 Views Left  Result Date: 10/18/2018 CLINICAL DATA:  Recent fall with hip pain, initial  encounter EXAM: LEFT FEMUR 2 VIEWS COMPARISON:  None. FINDINGS: There is a fracture through the proximal shaft of the left femur just below the intratrochanteric region with medial displacement of the distal femoral shaft with respect to the proximal femur. Considerable angulation is noted as well. No other fracture is seen. IMPRESSION: Fracture through the proximal left femur just below the intratrochanteric region with angulation and displacement of the distal femoral shaft. Electronically Signed   By: Inez Catalina M.D.   On: 10/18/2018 17:25    Pending Labs Unresulted Labs (From admission, onward)    Start     Ordered   10/25/18 0500  Creatinine, serum  (enoxaparin (LOVENOX)    CrCl >/= 30 ml/min)  Weekly,   R    Comments: while on enoxaparin therapy    10/18/18 1832   10/19/18 4782  Basic metabolic panel  Tomorrow morning,   R     10/18/18 1832   10/18/18 1847  CBC  (enoxaparin (LOVENOX)    CrCl >/= 30 ml/min)  Add-on,   AD    Comments: Baseline for enoxaparin therapy IF NOT ALREADY DRAWN.  Notify MD if PLT < 100 K.    10/18/18 1849   10/18/18 1847  Creatinine, serum  (enoxaparin (LOVENOX)    CrCl >/= 30 ml/min)  Add-on,   AD    Comments: Baseline for enoxaparin therapy IF NOT ALREADY DRAWN.    10/18/18 1849   10/18/18 1825  Type and screen [place]  Once,   STAT    Comments: [place]    10/18/18 1832          Vitals/Pain Today's Vitals   10/18/18 1802 10/18/18 1806 10/18/18 1855 10/18/18 1932  BP:  (!) 165/79  (!) 172/78  Pulse:  85  92  Resp:  17  16  Temp:    98.2 F (36.8 C)  TempSrc:    Oral  SpO2:  100%  99%  Weight:    59 kg  Height:    5\' 3"  (1.6 m)  PainSc: 9  5  5     Isolation Precautions No active isolations  Medications Medications  losartan-hydrochlorothiazide (HYZAAR) 100-12.5 MG per tablet 1 tablet (has no administration in time range)  simvastatin (ZOCOR) tablet 40 mg (has no administration in time  range)  levothyroxine (SYNTHROID) tablet 50 mcg (has no administration in time range)  vitamin B-12 (CYANOCOBALAMIN) tablet 1,000 mcg (has no administration in time range)  HYDROcodone-acetaminophen (NORCO/VICODIN) 5-325 MG per tablet 1-2 tablet (has no administration in time range)  morphine 2 MG/ML injection 0.5 mg (has no administration in time range)  enoxaparin (LOVENOX) injection 40 mg (has no administration in time range)  lactated ringers infusion (has no administration in time range)  methocarbamol (ROBAXIN) tablet 500 mg (has no administration in time range)    Or  methocarbamol (ROBAXIN) 500 mg in dextrose 5 % 50 mL IVPB (has no administration in time range)  zolpidem (AMBIEN) tablet 5 mg (has no administration in time range)  senna (SENOKOT) tablet 8.6 mg (has no administration in time range)  fentaNYL (SUBLIMAZE) injection 50 mcg (50 mcg Intravenous Given 10/18/18 1802)    Mobility walks

## 2018-10-18 NOTE — ED Notes (Signed)
ED Provider at bedside. 

## 2018-10-18 NOTE — H&P (Signed)
History and Physical    FATINA SPRANKLE GYF:[id number] DOB: 1926/07/27 DOA: 10/18/2018  PCP: Harlan Stains, MD (Confirm with patient/family/NH records and if not entered, this has to be entered at [facility] point of entry) Patient coming from: Patient is coming from home  I have personally briefly reviewed patient's old medical records in [place]  Chief Complaint: Sudden left leg pain after a fall  HPI: MIREILLE LACOMBE is a 85 y.o. female with medical history significant of well-managed hypertension, hypothyroid disease, hyperlipidemia.  She was in her usual state of good health this evening.  He got up from the table after spilling a glass of tea.  She slipped on the wet floor falling down sustained injury to her left hip.  She had immediate pain was.  She was unable to stand.  EMS was called and she was transported to [facility].  She rates her pain is severe.  Located in her left thigh.  She is unable to bear weight or stand her leg manipulated or move due to pain   ED Course: In the emergency department the patient was evaluated and was found to have a left proximal femur fracture.  Her laboratory was stable EKG was stable.  Dr. Sherrian Divers for orthopedics was consulted.  Quested patient be transferred to [place] is for her to go to the OR for operative repair of her left femur fracture in the a.m.  Review of Systems: As per HPI otherwise 10 point review of systems negative.  Specifically denies any respiratory difficulty, chest pain, nausea vomiting or abdominal pain.  Patient does admit to a mild stocking distribution paresthesia, secondary to lumbar spinal stenosis.   Past Medical History:  Diagnosis Date  . Arthritis   . Breast cancer ([place])   . Hyperlipidemia   . Hypertension   . Osteoporosis   . Thyroid disease     Past Surgical History:  Procedure Laterality Date  . ABDOMINAL HYSTERECTOMY    . APPENDECTOMY    . BREAST LUMPECTOMY WITH NEEDLE  LOCALIZATION Left 10/09/2012   Procedure: BREAST LUMPECTOMY WITH NEEDLE LOCALIZATION;  Surgeon: Merrie Roof, MD;  Location: [place];  Service: General;  Laterality: Left;  needle localization at [facility]IS 7:30   . COLONOSCOPY    . fissures    . MOUTH SURGERY       reports that she has never smoked. She has never used smokeless tobacco. She reports that she does not drink alcohol or use drugs.  Allergies  Allergen Reactions  . Wellbutrin [Bupropion] Other (See Comments)    Nervousness    Family History  Problem Relation Age of Onset  . Breast cancer Sister        dx in her 23s  . Cancer Maternal Grandmother        unknown form of cancer  . Heart attack Maternal Grandfather   . Cancer Cousin        maternal cousin with unknown form of cancer   Social history -patient is in a long-term marriage.  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  Husband, Timmothy Sours, is a retired Dealer.  He has 2 daughters, one living in [place] and one living in [place].  Her grandchildren and 5 great-grandchildren.  Lives alone with her husband and has been independent in her activities of daily living.  Prior to Admission medications   Medication Sig Start Date End Date Taking?  Authorizing Provider  aspirin EC 81 MG tablet Take 81 mg by mouth every morning.   Yes [provider]  denosumab (PROLIA) 60 MG/ML SOLN injection Inject 60 mg into the skin every 6 (six) months. Administer in upper arm, thigh, or abdomen   Yes [provider]  levothyroxine (SYNTHROID, LEVOTHROID) 50 MCG tablet Take 50 mcg by mouth every morning.   Yes [provider]  losartan-hydrochlorothiazide (HYZAAR) 100-12.5 MG per tablet Take 1 tablet by mouth every morning.   Yes [provider]  Magnesium 250 MG TABS Take 250 mg by mouth daily.   Yes [provider]  Misc Natural Products (LUTEIN 20) CAPS Take 1 capsule by mouth every morning.   Yes  [provider]  Omega-3 Fatty Acids (OMEGA 3 PO) Take 1 capsule by mouth daily.   Yes [provider]  simvastatin (ZOCOR) 20 MG tablet Take 20 mg by mouth every evening.    Yes [provider]  vitamin E 400 UNIT capsule Take 400 Units by mouth every evening.   Yes [provider]    Physical Exam: Vitals:   10/18/18 1656 10/18/18 1750 10/18/18 1751 10/18/18 1806  BP: (!) 174/82  (!) 170/78 (!) 165/79  Pulse: 84  82 85  Resp: 20  (!) 21 17  Temp:  97.8 F (36.6 C)    TempSrc:  Oral    SpO2: 98%  99% 100%    Constitutional: NAD, calm, comfortable Vitals:   10/18/18 1656 10/18/18 1750 10/18/18 1751 10/18/18 1806  BP: (!) 174/82  (!) 170/78 (!) 165/79  Pulse: 84  82 85  Resp: 20  (!) 21 17  Temp:  97.8 F (36.6 C)    TempSrc:  Oral    SpO2: 98%  99% 100%   General: Well-nourished well-developed woman looking younger than her stated chronologic age is in discomfort but not distress  eyes: Pupils are slightly irregular after intraocular lens implant from cataract surgery., lids and conjunctivae normal ENMT: Mucous membranes are moist. Posterior pharynx clear of any exudate or lesions.Normal dentition.  Neck: normal, supple, no masses, no thyromegaly Respiratory: clear to auscultation bilaterally, no wheezing, no crackles. Normal respiratory effort. No accessory muscle use.  Cardiovascular: Regular rate and rhythm, no murmurs / rubs / gallops. No extremity edema.  Trace pedal pulse on the right pedal pulse on the left pedal pulses. No carotid bruits.  Abdomen: no tenderness, no masses palpated. No hepatosplenomegaly. Bowel sounds positive.  Musculoskeletal: no clubbing / cyanosis.  Bilateral subluxation of thumbs is noted.  Left lower extremity is rotated outward is very tender to movement. Good ROM upper extremities and right lower extremity, no contractures. Normal muscle tone.  Skin: no rashes, lesions, ulcers. No induration Neurologic: CN 2-12  grossly intact. Sensation intact to light touch. Psychiatric: Normal judgment and insight. Alert and oriented x 3. Normal mood.     Labs on Admission: I have personally reviewed following labs and imaging studies  CBC: Recent Labs  Lab 10/18/18 1645  WBC 6.8  NEUTROABS 4.4  HGB 13.6  HCT 42.6  MCV 98.6  PLT 952   Basic Metabolic Panel: Recent Labs  Lab 10/18/18 1735  NA 139  K 3.6  CL 101  CO2 25  GLUCOSE 136*  BUN 13  CREATININE 0.64  CALCIUM 9.2   GFR: CrCl cannot be calculated (Unknown ideal weight.). Liver Function Tests: No results for input(s): AST, ALT, ALKPHOS, BILITOT, PROT, ALBUMIN in the last 168 hours. No  results for input(s): LIPASE, AMYLASE in the last 168 hours. No results for input(s): AMMONIA in the last 168 hours. Coagulation Profile: No results for input(s): INR, PROTIME in the last 168 hours. Cardiac Enzymes: No results for input(s): CKTOTAL, CKMB, CKMBINDEX, TROPONINI in the last 168 hours. BNP (last 3 results) No results for input(s): PROBNP in the last 8760 hours. HbA1C: No results for input(s): HGBA1C in the last 72 hours. CBG: No results for input(s): GLUCAP in the last 168 hours. Lipid Profile: No results for input(s): CHOL, HDL, LDLCALC, TRIG, CHOLHDL, LDLDIRECT in the last 72 hours. Thyroid Function Tests: No results for input(s): TSH, T4TOTAL, FREET4, T3FREE, THYROIDAB in the last 72 hours. Anemia Panel: No results for input(s): VITAMINB12, FOLATE, FERRITIN, TIBC, IRON, RETICCTPCT in the last 72 hours. Urine analysis: No results found for: COLORURINE, APPEARANCEUR, [place], [place], GLUCOSEU, HGBUR, BILIRUBINUR, KETONESUR, PROTEINUR, UROBILINOGEN, NITRITE, LEUKOCYTESUR  Radiological Exams on Admission: Dg Hip Unilat With Pelvis 2-3 Views Left  Result Date: 10/18/2018 CLINICAL DATA:  Left hip pain post fall. EXAM: DG HIP (WITH OR WITHOUT PELVIS) 2-3V LEFT COMPARISON:  None. FINDINGS: Transverse fracture of the left proximal femur,  just distal to the trochanteric region. The left hip is located with varus rotation of the proximal fracture fragment, and superior displacement of the distal fracture fragment. IMPRESSION: Displaced transverse fracture of the left proximal femur. Electronically Signed   By: Fidela Salisbury M.D.   On: 10/18/2018 17:26   Dg Femur Min 2 Views Left  Result Date: 10/18/2018 CLINICAL DATA:  Recent fall with hip pain, initial encounter EXAM: LEFT FEMUR 2 VIEWS COMPARISON:  None. FINDINGS: There is a fracture through the proximal shaft of the left femur just below the intratrochanteric region with medial displacement of the distal femoral shaft with respect to the proximal femur. Considerable angulation is noted as well. No other fracture is seen. IMPRESSION: Fracture through the proximal left femur just below the intratrochanteric region with angulation and displacement of the distal femoral shaft. Electronically Signed   By: Inez Catalina M.D.   On: 10/18/2018 17:25    EKG: Independently reviewed.  Normal sinus rhythm is noted.  Low voltage is noted.  No acute changes are found  Assessment/Plan Principal Problem:   Closed left subtrochanteric femur fracture, initial encounter [facility]) Active Problems:   Hypothyroidism   Hypertension   Hyperlipidemia   Closed left subtrochanteric femur fracture ([place])  1.  Femur fracture-patient with a fracture of the left femur just below the trochanter.  She has quite a bit of pain with any movement.  She has been placed into a knee brace. Plan transfer to [facility] at the request of her orthopedic consultant  Patient will be put in Buck's traction when she is transferred to [facility]  Patient for operative repair of her left femur fracture in the a.m.  Routine pain medications are ordered  Patient will be covered with Lovenox for DVT prophylaxis until time of surgery.  Aspirin is on hold  2.  Hypertension -patient reports her blood pressures usually well  controlled at home on her current medical regimen.  Her elevated blood pressure in the ED is secondary to pain from trauma. Plan will continue her home medication  3.  Thyroidism -patient reports she has been stable on her current dose of medication. Plan your home medication     DVT prophylaxis: Lovenox full dose (Lovenox/Heparin/SCD's/anticoagulated/None (if comfort care) Code Status: Full code.  Full discussion of end-of-life wishes will need to  take place after surgery (Full/Partial (specify details) Family Communication: With her husband Caylynn Minchew and gave him a full report.  I answered all of his and her questions.  (Specify name, relationship. Do not write "discussed with patient". Specify tel # if discussed over the phone) Disposition Plan: Charge to home postoperatively if possible.  And her advanced age and her husband's advanced age she may need short-term debilitation.  (specify when and where you expect patient to be discharged) Consults called:  Ortho-Dr. Sherrian Divers (with names) Admission status: Patient at [facility] (inpatient / obs / tele / medical floor / SDU)   Adella Hare MD [facility] Pager [phone]  If 7PM-7AM, please contact night-coverage [email or web address] Password [facility]  10/18/2018, 6:56 PM

## 2018-10-18 NOTE — ED Provider Notes (Addendum)
[facility] DEPT Provider Note   CSN: [id number] Arrival date & time: 10/18/18  1625    History   Chief Complaint Chief Complaint  Patient presents with  . Fall    HPI Becky Montoya is a 85 y.o. female.     Left hip pain after mechanical fall.   The history is provided by the patient.  Hip Pain This is a new problem. The current episode started less than 1 hour ago. The problem occurs constantly. The problem has not changed since onset.Pertinent negatives include no chest pain, no abdominal pain, no headaches and no shortness of breath. Nothing aggravates the symptoms. Nothing relieves the symptoms. She has tried nothing for the symptoms. The treatment provided no relief.    Past Medical History:  Diagnosis Date  . Arthritis   . Breast cancer ([place])   . Hyperlipidemia   . Hypertension   . Osteoporosis   . Thyroid disease     Patient Active Problem List   Diagnosis Date Noted  . Ductal carcinoma in situ (DCIS) of left breast 12/27/2016  . Mastitis, left, acute 12/19/2012  . Malignant neoplasm of upper-outer quadrant of left breast in female, estrogen receptor positive ([place]) 09/15/2012  . Chest pain 05/28/2012  . Hypothyroidism 05/28/2012  . Hypertension 05/28/2012  . Hyperlipidemia 05/28/2012    Past Surgical History:  Procedure Laterality Date  . ABDOMINAL HYSTERECTOMY    . APPENDECTOMY    . BREAST LUMPECTOMY WITH NEEDLE LOCALIZATION Left 10/09/2012   Procedure: BREAST LUMPECTOMY WITH NEEDLE LOCALIZATION;  Surgeon: Merrie Roof, MD;  Location: [facility];  Service: General;  Laterality: Left;  needle localization at SOLIS 7:30   . COLONOSCOPY    . fissures    . MOUTH SURGERY       OB History   No obstetric history on file.      Home Medications    Prior to Admission medications   Medication Sig Start Date End Date Taking? Authorizing Provider  aspirin EC 81 MG tablet Take 81 mg by mouth every morning.     [provider]  Calcium-Vitamin D (CALTRATE 600 PLUS-VIT D PO) Take by mouth daily.    [provider]  denosumab (PROLIA) 60 MG/ML SOLN injection Inject 60 mg into the skin every 6 (six) months. Administer in upper arm, thigh, or abdomen    [provider]  KRILL OIL PO Take by mouth daily.    [provider]  levothyroxine (SYNTHROID, LEVOTHROID) 50 MCG tablet Take 50 mcg by mouth every morning.    [provider]  losartan-hydrochlorothiazide (HYZAAR) 100-12.5 MG per tablet Take 1 tablet by mouth every morning.    [provider]  Misc Natural Products (LUTEIN 20) CAPS Take 1 capsule by mouth every morning.    [provider]  Multiple Vitamin (MULTIVITAMIN) tablet Take 1 tablet by mouth daily.    [provider]  Multiple Vitamins-Minerals (HAIR/SKIN/NAILS/BIOTIN PO) Take by mouth daily.    [provider]  NON FORMULARY 2 oz at bedtime. artho bonejoint    [provider]  simvastatin (ZOCOR) 40 MG tablet Take 40 mg by mouth every evening.    [provider]  TURMERIC PO Take by mouth daily.    [provider]  vitamin B-12 (CYANOCOBALAMIN) 1000 MCG tablet Take 1,000 mcg by mouth daily.    [provider]  vitamin E 400 UNIT capsule Take 400 Units by mouth every evening.  [provider]    Family History Family History  Problem Relation Age of Onset  . Breast cancer Sister        dx in her 64s  . Cancer Maternal Grandmother        unknown form of cancer  . Heart attack Maternal Grandfather   . Cancer Cousin        maternal cousin with unknown form of cancer    Social History Social History   Tobacco Use  . Smoking status: Never Smoker  . Smokeless tobacco: Never Used  Substance Use Topics  . Alcohol use: No  . Drug use: No     Allergies   Wellbutrin [bupropion]   Review of Systems Review of Systems  Constitutional: Negative for chills  and fever.  HENT: Negative for ear pain and sore throat.   Eyes: Negative for pain and visual disturbance.  Respiratory: Negative for cough and shortness of breath.   Cardiovascular: Negative for chest pain and palpitations.  Gastrointestinal: Negative for abdominal pain and vomiting.  Genitourinary: Negative for dysuria and hematuria.  Musculoskeletal: Positive for arthralgias and back pain.  Skin: Negative for color change and rash.  Neurological: Negative for seizures, syncope and headaches.  All other systems reviewed and are negative.    Physical Exam Updated Vital Signs  ED Triage Vitals  Enc Vitals Group     BP 10/18/18 1656 (!) 174/82     Pulse Rate 10/18/18 1656 84     Resp 10/18/18 1656 20     Temp --      Temp src --      SpO2 10/18/18 1656 98 %     Weight --      Height --      Head Circumference --      Peak Flow --      Pain Score 10/18/18 1659 3     Pain Loc --      Pain Edu? --      Excl. in [place]? --     Physical Exam Vitals signs and nursing note reviewed.  Constitutional:      General: She is not in acute distress.    Appearance: She is well-developed.  HENT:     Head: Normocephalic and atraumatic.     Nose: Nose normal.     Mouth/Throat:     Mouth: Mucous membranes are moist.  Eyes:     Extraocular Movements: Extraocular movements intact.     Conjunctiva/sclera: Conjunctivae normal.     Pupils: Pupils are equal, round, and reactive to light.  Neck:     Musculoskeletal: Neck supple.  Cardiovascular:     Rate and Rhythm: Normal rate and regular rhythm.     Pulses: Normal pulses.     Heart sounds: Normal heart sounds. No murmur.     Comments: Doppler pulse to left foot Pulmonary:     Effort: Pulmonary effort is normal. No respiratory distress.     Breath sounds: Normal breath sounds.  Abdominal:     Palpations: Abdomen is soft.     Tenderness: There is no abdominal tenderness.  Musculoskeletal:        General: Tenderness and deformity (LLE  at hip with swelling) present.     Comments: Decreased ROM of LLE due to pain   Skin:    General: Skin is warm and dry.  Neurological:     General: No focal deficit present.     Mental Status: She is alert.  Sensory: No sensory deficit.     Motor: No weakness.      ED Treatments / Results  Labs (all labs ordered are listed, but only abnormal results are displayed) Labs Reviewed  SARS CORONAVIRUS 2 (HOSPITAL ORDER, [facility])  CBC WITH DIFFERENTIAL/PLATELET  BASIC METABOLIC PANEL    EKG EKG Interpretation  Date/Time:  Saturday October 18 2018 18:04:40 EDT Ventricular Rate:  85 PR Interval:    QRS Duration: 102 QT Interval:  382 QTC Calculation: 455 R Axis:   73 Text Interpretation:  Sinus rhythm Low voltage, precordial leads Confirmed by Lennice Sites [phone]) on 10/18/2018 6:07:47 PM   Radiology Dg Hip Unilat With Pelvis 2-3 Views Left  Result Date: 10/18/2018 CLINICAL DATA:  Left hip pain post fall. EXAM: DG HIP (WITH OR WITHOUT PELVIS) 2-3V LEFT COMPARISON:  None. FINDINGS: Transverse fracture of the left proximal femur, just distal to the trochanteric region. The left hip is located with varus rotation of the proximal fracture fragment, and superior displacement of the distal fracture fragment. IMPRESSION: Displaced transverse fracture of the left proximal femur. Electronically Signed   By: Fidela Salisbury M.D.   On: 10/18/2018 17:26   Dg Femur Min 2 Views Left  Result Date: 10/18/2018 CLINICAL DATA:  Recent fall with hip pain, initial encounter EXAM: LEFT FEMUR 2 VIEWS COMPARISON:  None. FINDINGS: There is a fracture through the proximal shaft of the left femur just below the intratrochanteric region with medial displacement of the distal femoral shaft with respect to the proximal femur. Considerable angulation is noted as well. No other fracture is seen. IMPRESSION: Fracture through the proximal left femur just below the intratrochanteric  region with angulation and displacement of the distal femoral shaft. Electronically Signed   By: Inez Catalina M.D.   On: 10/18/2018 17:25    Procedures .Sedation  Date/Time: 10/18/2018 9:06 PM Performed by: Lennice Sites, DO Authorized by: Lennice Sites, DO   Consent:    Consent obtained:  Verbal   Consent given by:  Patient   Risks discussed:  Allergic reaction, dysrhythmia, inadequate sedation, nausea, prolonged hypoxia resulting in organ damage, prolonged sedation necessitating reversal, respiratory compromise necessitating ventilatory assistance and intubation and vomiting   Alternatives discussed:  Analgesia without sedation Universal protocol:    Procedure explained and questions answered to patient or proxy's satisfaction: yes     Relevant documents present and verified: yes     Test results available and properly labeled: yes     Imaging studies available: yes     Site/side marked: yes     Immediately prior to procedure a time out was called: yes   Indications:    Procedure performed:  Fracture reduction   Procedure necessitating sedation performed by:  Physician performing sedation Pre-sedation assessment:    Time since last food or drink:  9 hours   ASA classification: class 3 - patient with severe systemic disease     Neck mobility: normal     Mouth opening:  3 or more finger widths   Thyromental distance:  4 finger widths   Mallampati score:  II - soft palate, uvula, fauces visible   Pre-sedation assessments completed and reviewed: airway patency, cardiovascular function, hydration status, mental status, nausea/vomiting, pain level, respiratory function and temperature     Pre-sedation assessment completed:  10/18/2018 9:17 PM Immediate pre-procedure details:    Reassessment: Patient reassessed immediately prior to procedure     Reviewed: vital signs, relevant labs/tests and NPO status  Verified: bag valve mask available, emergency equipment available, intubation  equipment available, IV patency confirmed, oxygen available, reversal medications available and suction available   Procedure details (see MAR for exact dosages):    Preoxygenation:  Nasal cannula   Sedation:  Midazolam   Analgesia:  Fentanyl   Intra-procedure monitoring:  Blood pressure monitoring, cardiac monitor, continuous capnometry, continuous pulse oximetry, frequent LOC assessments and frequent vital sign checks   Total Provider sedation time (minutes):  20 Post-procedure details:    Post-sedation assessment completed:  10/18/2018 10:18 PM   Post-sedation assessments completed and reviewed: airway patency, cardiovascular function, hydration status, mental status, nausea/vomiting, pain level, respiratory function and temperature     Patient is stable for discharge or admission: yes     Patient tolerance:  Tolerated well, no immediate complications   (including critical care time)  Medications Ordered in ED Medications  fentaNYL (SUBLIMAZE) injection 50 mcg (50 mcg Intravenous Given 10/18/18 1802)     Initial Impression / Assessment and Plan / ED Course  I have reviewed the triage vital signs and the nursing notes.  Pertinent labs & imaging results that were available during my care of the patient were reviewed by me and considered in my medical decision making (see chart for details).     TEONDRA NEWBURG is a 84 year old female history of hypertension, high cholesterol who presents to the ED with left leg pain.  Patient with mechanical fall.  Normal vitals.  No fever.  Patient did not hit her head or lose consciousness.  Deformity to the left upper leg.  Patient with Doppler pulses in her foot.  Good sensation and strength distally.  X-rays confirmed proximal left femur fracture with angulation and displacement of the femoral shaft from the hip.  Patient given IV fentanyl for pain.  Lab work was unremarkable.  Dr. Erlinda Hong with orthopedics recommends admission to hospitalist service and  transferred to [facility].  They will leave further recommendations.  Patient admitted in stable condition.  Coronavirus test has been ordered.   Dr. Erlinda Hong came to the bedside to place patient in bucks traction. Procedural sedation was done with fentanyl and versed and was well tolerated.  This chart was dictated using voice recognition software.  Despite best efforts to proofread,  errors can occur which can change the documentation meaning.    Final Clinical Impressions(s) / ED Diagnoses   Final diagnoses:  Closed fracture of left femur, unspecified fracture morphology, unspecified portion of femur, initial encounter [facility])    ED Discharge Orders    None       Lennice Sites, DO 10/18/18 [place], [place], DO 10/18/18 [place], [place], DO 10/18/18 [place], [place], DO 10/18/18 2218

## 2018-10-18 NOTE — ED Notes (Signed)
Patient transported to X-ray 

## 2018-10-18 NOTE — ED Notes (Signed)
XR at bedside

## 2018-10-18 NOTE — ED Notes (Signed)
[facility] dispatch notified for need of transport.  

## 2018-10-18 NOTE — Consult Note (Addendum)
ORTHOPAEDIC CONSULTATION  REQUESTING PHYSICIAN: Norins, Heinz Knuckles, MD  Chief Complaint: Left subtroch fracture  HPI: Becky Montoya is a 85 y.o. female who presents with left subtroch fracture s/p mechanical fall at home.  The patient endorses severe pain in the left hip, that does not radiate, grinding in quality, worse with any movement, better with immobilization.  Denies LOC/fever/chills/nausea/vomiting.  Walks with assistive devices (walker, cane, wheelchair).  Lives at home with husband.  Denies LOC, neck pain, abd pain.  Past Medical History:  Diagnosis Date  . Arthritis   . Breast cancer ([place])   . Hyperlipidemia   . Hypertension   . Osteoporosis   . Thyroid disease    Past Surgical History:  Procedure Laterality Date  . ABDOMINAL HYSTERECTOMY    . APPENDECTOMY    . BREAST LUMPECTOMY WITH NEEDLE LOCALIZATION Left 10/09/2012   Procedure: BREAST LUMPECTOMY WITH NEEDLE LOCALIZATION;  Surgeon: Merrie Roof, MD;  Location: [facility];  Service: General;  Laterality: Left;  needle localization at SOLIS 7:30   . COLONOSCOPY    . fissures    . MOUTH SURGERY     Social History   Socioeconomic History  . Marital status: Married    Spouse name: Not on file  . Number of children: 3  . Years of education: Not on file  . Highest education level: Not on file  Occupational History  . Occupation: Retired Restaurant manager, fast food  Social Needs  . Financial resource strain: Not on file  . Food insecurity    Worry: Not on file    Inability: Not on file  . Transportation needs    Medical: Not on file    Non-medical: Not on file  Tobacco Use  . Smoking status: Never Smoker  . Smokeless tobacco: Never Used  Substance and Sexual Activity  . Alcohol use: No  . Drug use: No  . Sexual activity: Not Currently  Lifestyle  . Physical activity    Days per week: Not on file    Minutes per session: Not on file  . Stress: Not on file  Relationships  . Social Product manager on phone: Not on file    Gets together: Not on file    Attends religious service: Not on file    Active member of club or organization: Not on file    Attends meetings of clubs or organizations: Not on file    Relationship status: Not on file  Other Topics Concern  . Not on file  Social History Narrative   Lives with husband. Active with housework and church activities.   Family History  Problem Relation Age of Onset  . Breast cancer Sister        dx in her 50s  . Cancer Maternal Grandmother        unknown form of cancer  . Heart attack Maternal Grandfather   . Cancer Cousin        maternal cousin with unknown form of cancer   Allergies  Allergen Reactions  . Wellbutrin [Bupropion] Other (See Comments)    Nervousness   Prior to Admission medications   Medication Sig Start Date End Date Taking? Authorizing Provider  aspirin EC 81 MG tablet Take 81 mg by mouth every morning.   Yes [provider]  denosumab (PROLIA) 60 MG/ML SOLN injection Inject 60 mg into the skin every 6 (six) months. Administer in upper arm, thigh, or abdomen   Yes [provider]  levothyroxine (SYNTHROID, LEVOTHROID) 50 MCG tablet Take 50 mcg by mouth every morning.   Yes [provider]  losartan-hydrochlorothiazide (HYZAAR) 100-12.5 MG per tablet Take 1 tablet by mouth every morning.   Yes [provider]  Magnesium 250 MG TABS Take 250 mg by mouth daily.   Yes [provider]  Misc Natural Products (LUTEIN 20) CAPS Take 1 capsule by mouth every morning.   Yes [provider]  Omega-3 Fatty Acids (OMEGA 3 PO) Take 1 capsule by mouth daily.   Yes [provider]  simvastatin (ZOCOR) 20 MG tablet Take 20 mg by mouth every evening.    Yes [provider]  vitamin E 400 UNIT capsule Take 400 Units by mouth every evening.   Yes [provider]   Chest Portable 1 View  Result Date: 10/18/2018 CLINICAL DATA:  Preoperative  study.  Known hip fracture. EXAM: PORTABLE CHEST 1 VIEW COMPARISON:  None. FINDINGS: The heart size and mediastinal contours are within normal limits. Both lungs are clear. The visualized skeletal structures are unremarkable. IMPRESSION: No active disease. Electronically Signed   By: Dorise Bullion III M.D   On: 10/18/2018 19:06   Dg Hip Unilat With Pelvis 2-3 Views Left  Result Date: 10/18/2018 CLINICAL DATA:  Left hip pain post fall. EXAM: DG HIP (WITH OR WITHOUT PELVIS) 2-3V LEFT COMPARISON:  None. FINDINGS: Transverse fracture of the left proximal femur, just distal to the trochanteric region. The left hip is located with varus rotation of the proximal fracture fragment, and superior displacement of the distal fracture fragment. IMPRESSION: Displaced transverse fracture of the left proximal femur. Electronically Signed   By: Fidela Salisbury M.D.   On: 10/18/2018 17:26   Dg Femur Min 2 Views Left  Result Date: 10/18/2018 CLINICAL DATA:  Recent fall with hip pain, initial encounter EXAM: LEFT FEMUR 2 VIEWS COMPARISON:  None. FINDINGS: There is a fracture through the proximal shaft of the left femur just below the intratrochanteric region with medial displacement of the distal femoral shaft with respect to the proximal femur. Considerable angulation is noted as well. No other fracture is seen. IMPRESSION: Fracture through the proximal left femur just below the intratrochanteric region with angulation and displacement of the distal femoral shaft. Electronically Signed   By: Inez Catalina M.D.   On: 10/18/2018 17:25    All pertinent xrays, MRI, CT independently reviewed and interpreted  Positive ROS: All other systems have been reviewed and were otherwise negative with the exception of those mentioned in the HPI and as above.  Physical Exam: General: Alert, no acute distress Cardiovascular: No pedal edema Respiratory: No cyanosis, no use of accessory musculature GI: No organomegaly, abdomen is  soft and non-tender Skin: No lesions in the area of chief complaint Neurologic: Sensation intact distally Psychiatric: Patient is competent for consent with normal mood and affect Lymphatic: No axillary or cervical lymphadenopathy  MUSCULOSKELETAL:  - severe pain with movement of the hip and extremity - skin intact - NVI distally - compartments soft  Assessment: Left subtroch fracture  Plan: - surgical stabilization is recommended, patient and family are aware of r/b/a and wish to proceed, informed consent obtained - medical optimization per primary team - surgery is planned for tomorrow morning - hold lovenox for impending surgery - NPO after midnight - I manipulated the fracture into better alignment under conscious sedation, placed her in buck's traction  Thank you for the consult and the opportunity to see Ms.  Edman Circle. Eduard Roux, MD 9:15 PM

## 2018-10-18 NOTE — ED Notes (Signed)
[facility] here for transport to [place].  

## 2018-10-18 NOTE — ED Notes (Signed)
Unsuccessful IV attempt x1. Charge RN to attempt [place] IV. Multiple unsuccessful attempts by EMS.

## 2018-10-18 NOTE — ED Notes (Signed)
Patient's Becky Montoya [place], [phone]

## 2018-10-19 ENCOUNTER — Inpatient Hospital Stay (HOSPITAL_COMMUNITY): Payer: Medicare Other | Admitting: Anesthesiology

## 2018-10-19 ENCOUNTER — Other Ambulatory Visit: Payer: Self-pay

## 2018-10-19 ENCOUNTER — Encounter (HOSPITAL_COMMUNITY)
Admission: EM | Disposition: A | Discharge status: Skilled Nursing Facility | Payer: Self-pay | Source: Home / Self Care | Attending: Internal Medicine

## 2018-10-19 ENCOUNTER — Inpatient Hospital Stay (HOSPITAL_COMMUNITY): Payer: Medicare Other

## 2018-10-19 ENCOUNTER — Encounter (HOSPITAL_COMMUNITY): Payer: Self-pay | Admitting: *Deleted

## 2018-10-19 DIAGNOSIS — S7222XA Displaced subtrochanteric fracture of left femur, initial encounter for closed fracture: Principal | ICD-10-CM

## 2018-10-19 DIAGNOSIS — Y92009 Unspecified place in unspecified non-institutional (private) residence as the place of occurrence of the external cause: Secondary | ICD-10-CM

## 2018-10-19 DIAGNOSIS — W19XXXA Unspecified fall, initial encounter: Secondary | ICD-10-CM

## 2018-10-19 HISTORY — PX: INTRAMEDULLARY (IM) NAIL INTERTROCHANTERIC: SHX5875

## 2018-10-19 LAB — BASIC METABOLIC PANEL
Anion gap: 9 (ref 5–15)
BUN: 14 mg/dL (ref 8–23)
CO2: 24 mmol/L (ref 22–32)
Calcium: 8.7 mg/dL — ABNORMAL LOW (ref 8.9–10.3)
Chloride: 104 mmol/L (ref 98–111)
Creatinine, Ser: 0.73 mg/dL (ref 0.44–1.00)
GFR calc Af Amer: >60 mL/min (ref >60)
GFR calc non Af Amer: >60 mL/min (ref >60)
Glucose, Bld: 145 mg/dL — ABNORMAL HIGH (ref 70–99)
Potassium: 3.5 mmol/L (ref 3.5–5.1)
Sodium: 137 mmol/L (ref 135–145)

## 2018-10-19 LAB — ABO/RH: ABO/RH(D): A NEG

## 2018-10-19 LAB — SURGICAL PCR SCREEN
MRSA, PCR: NEGATIVE
Staphylococcus aureus: NEGATIVE

## 2018-10-19 SURGERY — FIXATION, FRACTURE, INTERTROCHANTERIC, WITH INTRAMEDULLARY ROD
Anesthesia: General | Site: Leg Upper | Laterality: Left

## 2018-10-19 MED ORDER — SORBITOL 70 % SOLN: 30.0000 mL | Freq: Every day | Status: DC | PRN

## 2018-10-19 MED ORDER — TRANEXAMIC ACID-NACL 1000-0.7 MG/100ML-% IV SOLN
INTRAVENOUS | Status: AC
  Filled 2018-10-19: qty 100

## 2018-10-19 MED ORDER — HYDROCODONE-ACETAMINOPHEN 7.5-325 MG PO TABS: 1.0000 | ORAL_TABLET | ORAL | Status: DC | PRN

## 2018-10-19 MED ORDER — LIDOCAINE 2% (20 MG/ML) 5 ML SYRINGE
INTRAMUSCULAR | Status: AC
  Filled 2018-10-19: qty 5

## 2018-10-19 MED ORDER — LIP MEDEX EX OINT: 1.0000 "application " | TOPICAL_OINTMENT | CUTANEOUS | Status: DC | PRN

## 2018-10-19 MED ORDER — ONDANSETRON HCL 4 MG/2ML IJ SOLN
INTRAMUSCULAR | Status: AC
  Filled 2018-10-19: qty 2

## 2018-10-19 MED ORDER — PHENOL 1.4 % MT LIQD: 1.0000 | OROMUCOSAL | Status: DC | PRN

## 2018-10-19 MED ORDER — SALINE SPRAY 0.65 % NA SOLN: 1.0000 | NASAL | Status: DC | PRN

## 2018-10-19 MED ORDER — TRANEXAMIC ACID 1000 MG/10ML IV SOLN
INTRAVENOUS | Status: DC | PRN
  Administered 2018-10-19: 1000 mg via INTRAVENOUS

## 2018-10-19 MED ORDER — RIVAROXABAN 10 MG PO TABS
10.0000 mg | ORAL_TABLET | Freq: Every day | ORAL | Status: DC
  Administered 2018-10-20 – 2018-10-22 (×3): 10 mg via ORAL
  Filled 2018-10-19 (×5): qty 1

## 2018-10-19 MED ORDER — ROCURONIUM BROMIDE 50 MG/5ML IV SOSY
PREFILLED_SYRINGE | INTRAVENOUS | Status: DC | PRN
  Administered 2018-10-19: 50 mg via INTRAVENOUS

## 2018-10-19 MED ORDER — ONDANSETRON HCL 4 MG PO TABS: 4.0000 mg | ORAL_TABLET | Freq: Four times a day (QID) | ORAL | Status: DC | PRN

## 2018-10-19 MED ORDER — HYDRALAZINE HCL 20 MG/ML IJ SOLN: 10.0000 mg | INTRAMUSCULAR | Status: DC | PRN

## 2018-10-19 MED ORDER — LORATADINE 10 MG PO TABS: 10.0000 mg | ORAL_TABLET | Freq: Every day | ORAL | Status: DC | PRN

## 2018-10-19 MED ORDER — CEFAZOLIN SODIUM-DEXTROSE 2-4 GM/100ML-% IV SOLN
2.0000 g | Freq: Four times a day (QID) | INTRAVENOUS | Status: AC
  Administered 2018-10-19 – 2018-10-20 (×3): 2 g via INTRAVENOUS
  Filled 2018-10-19 (×3): qty 100

## 2018-10-19 MED ORDER — MUSCLE RUB 10-15 % EX CREA: 1.0000 "application " | TOPICAL_CREAM | CUTANEOUS | Status: DC | PRN

## 2018-10-19 MED ORDER — ACETAMINOPHEN 500 MG PO TABS
500.0000 mg | ORAL_TABLET | Freq: Four times a day (QID) | ORAL | Status: AC
  Administered 2018-10-19 – 2018-10-20 (×3): 500 mg via ORAL
  Filled 2018-10-19 (×3): qty 1

## 2018-10-19 MED ORDER — ROCURONIUM BROMIDE 10 MG/ML (PF) SYRINGE
PREFILLED_SYRINGE | INTRAVENOUS | Status: AC
  Filled 2018-10-19: qty 10

## 2018-10-19 MED ORDER — METHOCARBAMOL 1000 MG/10ML IJ SOLN: 500.0000 mg | Freq: Four times a day (QID) | INTRAVENOUS | Status: DC | PRN

## 2018-10-19 MED ORDER — STERILE WATER FOR IRRIGATION IR SOLN
Status: DC | PRN
  Administered 2018-10-19: 1000 mL

## 2018-10-19 MED ORDER — FENTANYL CITRATE (PF) 250 MCG/5ML IJ SOLN
INTRAMUSCULAR | Status: AC
  Filled 2018-10-19: qty 5

## 2018-10-19 MED ORDER — HYDROCODONE-ACETAMINOPHEN 5-325 MG PO TABS: 1.0000 | ORAL_TABLET | ORAL | Status: DC | PRN

## 2018-10-19 MED ORDER — ALUM & MAG HYDROXIDE-SIMETH 200-200-20 MG/5ML PO SUSP: 30.0000 mL | ORAL | Status: DC | PRN

## 2018-10-19 MED ORDER — MAGNESIUM CITRATE PO SOLN: 1.0000 | Freq: Once | ORAL | Status: DC | PRN

## 2018-10-19 MED ORDER — MORPHINE SULFATE (PF) 2 MG/ML IV SOLN: 1.0000 mg | INTRAVENOUS | Status: DC | PRN

## 2018-10-19 MED ORDER — PROPOFOL 10 MG/ML IV BOLUS
INTRAVENOUS | Status: AC
  Filled 2018-10-19: qty 20

## 2018-10-19 MED ORDER — POLYETHYLENE GLYCOL 3350 17 G PO PACK
17.0000 g | PACK | Freq: Every day | ORAL | Status: DC | PRN
  Filled 2018-10-19: qty 1

## 2018-10-19 MED ORDER — METHOCARBAMOL 500 MG PO TABS: 500.0000 mg | ORAL_TABLET | Freq: Four times a day (QID) | ORAL | Status: DC | PRN

## 2018-10-19 MED ORDER — SODIUM CHLORIDE 0.9 % IV SOLN
INTRAVENOUS | Status: DC | PRN
  Administered 2018-10-19: 50 ug/min via INTRAVENOUS

## 2018-10-19 MED ORDER — HYDROCORTISONE 1 % EX CREA: 1.0000 "application " | TOPICAL_CREAM | Freq: Three times a day (TID) | CUTANEOUS | Status: DC | PRN

## 2018-10-19 MED ORDER — FENTANYL CITRATE (PF) 100 MCG/2ML IJ SOLN
INTRAMUSCULAR | Status: AC
  Filled 2018-10-19: qty 2

## 2018-10-19 MED ORDER — GUAIFENESIN-DM 100-10 MG/5ML PO SYRP: 5.0000 mL | ORAL_SOLUTION | ORAL | Status: DC | PRN

## 2018-10-19 MED ORDER — PROPOFOL 10 MG/ML IV BOLUS
INTRAVENOUS | Status: DC | PRN
  Administered 2018-10-19: 60 mg via INTRAVENOUS

## 2018-10-19 MED ORDER — FENTANYL CITRATE (PF) 100 MCG/2ML IJ SOLN
25.0000 ug | INTRAMUSCULAR | Status: DC | PRN
  Administered 2018-10-19: 25 ug via INTRAVENOUS

## 2018-10-19 MED ORDER — TRANEXAMIC ACID 1000 MG/10ML IV SOLN: INTRAVENOUS | Status: DC | PRN

## 2018-10-19 MED ORDER — MENTHOL 3 MG MT LOZG: 1.0000 | LOZENGE | OROMUCOSAL | Status: DC | PRN

## 2018-10-19 MED ORDER — SENNOSIDES-DOCUSATE SODIUM 8.6-50 MG PO TABS: 2.0000 | ORAL_TABLET | Freq: Every evening | ORAL | Status: DC | PRN

## 2018-10-19 MED ORDER — DOCUSATE SODIUM 100 MG PO CAPS
100.0000 mg | ORAL_CAPSULE | Freq: Two times a day (BID) | ORAL | Status: DC
  Administered 2018-10-19 – 2018-10-23 (×8): 100 mg via ORAL
  Filled 2018-10-19 (×9): qty 1

## 2018-10-19 MED ORDER — PHENYLEPHRINE 40 MCG/ML (10ML) SYRINGE FOR IV PUSH (FOR BLOOD PRESSURE SUPPORT)
PREFILLED_SYRINGE | INTRAVENOUS | Status: DC | PRN
  Administered 2018-10-19 (×3): 80 ug via INTRAVENOUS
  Administered 2018-10-19: 120 ug via INTRAVENOUS

## 2018-10-19 MED ORDER — ONDANSETRON HCL 4 MG/2ML IJ SOLN: 4.0000 mg | Freq: Four times a day (QID) | INTRAMUSCULAR | Status: DC | PRN

## 2018-10-19 MED ORDER — 0.9 % SODIUM CHLORIDE (POUR BTL) OPTIME
TOPICAL | Status: DC | PRN
  Administered 2018-10-19: 1000 mL

## 2018-10-19 MED ORDER — HYDROCORTISONE (PERIANAL) 2.5 % EX CREA: 1.0000 "application " | TOPICAL_CREAM | Freq: Four times a day (QID) | CUTANEOUS | Status: DC | PRN

## 2018-10-19 MED ORDER — POLYVINYL ALCOHOL 1.4 % OP SOLN: 1.0000 [drp] | OPHTHALMIC | Status: DC | PRN

## 2018-10-19 MED ORDER — CEFAZOLIN SODIUM-DEXTROSE 2-4 GM/100ML-% IV SOLN
INTRAVENOUS | Status: AC
  Filled 2018-10-19: qty 100

## 2018-10-19 MED ORDER — LACTATED RINGERS IV SOLN
INTRAVENOUS | Status: DC | PRN
  Administered 2018-10-19 (×2): via INTRAVENOUS

## 2018-10-19 MED ORDER — ACETAMINOPHEN 325 MG PO TABS
325.0000 mg | ORAL_TABLET | Freq: Four times a day (QID) | ORAL | Status: DC | PRN
  Administered 2018-10-20 – 2018-10-24 (×6): 650 mg via ORAL
  Filled 2018-10-19 (×6): qty 2

## 2018-10-19 MED ORDER — FENTANYL CITRATE (PF) 100 MCG/2ML IJ SOLN
INTRAMUSCULAR | Status: DC | PRN
  Administered 2018-10-19 (×3): 50 ug via INTRAVENOUS

## 2018-10-19 MED ORDER — SUGAMMADEX SODIUM 200 MG/2ML IV SOLN
INTRAVENOUS | Status: DC | PRN
  Administered 2018-10-19: 200 mg via INTRAVENOUS

## 2018-10-19 MED ORDER — CEFAZOLIN SODIUM-DEXTROSE 2-3 GM-%(50ML) IV SOLR
INTRAVENOUS | Status: DC | PRN
  Administered 2018-10-19: 2 g via INTRAVENOUS

## 2018-10-19 MED ORDER — SODIUM CHLORIDE 0.9 % IV SOLN
INTRAVENOUS | Status: DC
  Administered 2018-10-19: 75 mL/h via INTRAVENOUS
  Administered 2018-10-19 – 2018-10-21 (×2): via INTRAVENOUS

## 2018-10-19 MED ORDER — LIDOCAINE 2% (20 MG/ML) 5 ML SYRINGE
INTRAMUSCULAR | Status: DC | PRN
  Administered 2018-10-19: 40 mg via INTRAVENOUS

## 2018-10-19 MED ORDER — PHENYLEPHRINE 40 MCG/ML (10ML) SYRINGE FOR IV PUSH (FOR BLOOD PRESSURE SUPPORT)
PREFILLED_SYRINGE | INTRAVENOUS | Status: AC
  Filled 2018-10-19: qty 10

## 2018-10-19 MED ORDER — DEXAMETHASONE SODIUM PHOSPHATE 10 MG/ML IJ SOLN
INTRAMUSCULAR | Status: AC
  Filled 2018-10-19: qty 2

## 2018-10-19 MED ORDER — HYDROCODONE-ACETAMINOPHEN 7.5-325 MG PO TABS: 1.0000 | ORAL_TABLET | Freq: Three times a day (TID) | ORAL | 0 refills | Status: DC | PRN

## 2018-10-19 MED ORDER — POLYETHYLENE GLYCOL 3350 17 G PO PACK: 17.0000 g | PACK | Freq: Every day | ORAL | Status: DC | PRN

## 2018-10-19 MED ORDER — RIVAROXABAN 10 MG PO TABS: 10.0000 mg | ORAL_TABLET | Freq: Every day | ORAL | 0 refills | Status: DC

## 2018-10-19 SURGICAL SUPPLY — 52 items
BIT DRILL SHORT 4.0 (BIT) IMPLANT
BNDG COHESIVE 4X5 TAN NS LF (GAUZE/BANDAGES/DRESSINGS) ×2 IMPLANT
BNDG COHESIVE 6X5 TAN STRL LF (GAUZE/BANDAGES/DRESSINGS) ×1 IMPLANT
BNDG GAUZE ELAST 4 BULKY (GAUZE/BANDAGES/DRESSINGS) ×2 IMPLANT
COVER PERINEAL POST (MISCELLANEOUS) ×2 IMPLANT
COVER SURGICAL LIGHT HANDLE (MISCELLANEOUS) ×2 IMPLANT
COVER WAND RF STERILE (DRAPES) ×2 IMPLANT
DRAPE C-ARM 42X72 X-RAY (DRAPES) ×1 IMPLANT
DRAPE C-ARMOR (DRAPES) ×1 IMPLANT
DRAPE HALF SHEET 40X57 (DRAPES) ×2 IMPLANT
DRAPE IMP U-DRAPE 54X76 (DRAPES) ×2 IMPLANT
DRAPE ORTHO SPLIT 87X125 STRL (DRAPES) ×2 IMPLANT
DRAPE STERI IOBAN 125X83 (DRAPES) ×2 IMPLANT
DRILL BIT SHORT 4.0 (BIT) ×4
DRSG MEPILEX BORDER 4X4 (GAUZE/BANDAGES/DRESSINGS) ×6 IMPLANT
DRSG MEPILEX BORDER 4X8 (GAUZE/BANDAGES/DRESSINGS) ×2 IMPLANT
DRSG PAD ABDOMINAL 8X10 ST (GAUZE/BANDAGES/DRESSINGS) ×4 IMPLANT
DURAPREP 26ML APPLICATOR (WOUND CARE) ×2 IMPLANT
ELECT REM PT RETURN 9FT ADLT (ELECTROSURGICAL) ×2
ELECTRODE REM PT RTRN 9FT ADLT (ELECTROSURGICAL) ×1 IMPLANT
GLOVE BIOGEL PI IND STRL 7.0 (GLOVE) ×1 IMPLANT
GLOVE BIOGEL PI INDICATOR 7.0 (GLOVE) ×1
GLOVE ECLIPSE 7.0 STRL STRAW (GLOVE) ×2 IMPLANT
GLOVE SKINSENSE NS SZ7.5 (GLOVE) ×2
GLOVE SKINSENSE STRL SZ7.5 (GLOVE) ×2 IMPLANT
GOWN STRL REIN XL XLG (GOWN DISPOSABLE) ×2 IMPLANT
GUIDE PIN 3.2X343 (PIN) ×2
GUIDE PIN 3.2X343MM (PIN) ×4
GUIDE ROD 3.0 (MISCELLANEOUS) ×2
HALF PIN 45MM (Pin) ×1 IMPLANT
KIT BASIN OR (CUSTOM PROCEDURE TRAY) ×2 IMPLANT
KIT TURNOVER KIT B (KITS) ×2 IMPLANT
MANIFOLD NEPTUNE II (INSTRUMENTS) ×2 IMPLANT
NAIL LEFT 10X36 (Nail) ×1 IMPLANT
NS IRRIG 1000ML POUR BTL (IV SOLUTION) ×2 IMPLANT
PACK GENERAL/GYN (CUSTOM PROCEDURE TRAY) ×2 IMPLANT
PAD ARMBOARD 7.5X6 YLW CONV (MISCELLANEOUS) ×4 IMPLANT
PAD CAST 4YDX4 CTTN HI CHSV (CAST SUPPLIES) ×2 IMPLANT
PADDING CAST COTTON 4X4 STRL (CAST SUPPLIES) ×4
PIN GUIDE 3.2X343MM (PIN) IMPLANT
ROD GUIDE 3.0 (MISCELLANEOUS) IMPLANT
SCREW LAG COMPR KIT 85/80 (Screw) ×1 IMPLANT
SCREW TRIGEN LOW PROF 5.0X35 (Screw) ×1 IMPLANT
SCREW TRIGEN LOW PROF 5.0X40 (Screw) ×1 IMPLANT
STAPLER VISISTAT 35W (STAPLE) ×2 IMPLANT
SUT VIC AB 0 CT1 27 (SUTURE) ×2
SUT VIC AB 0 CT1 27XBRD ANBCTR (SUTURE) ×1 IMPLANT
SUT VIC AB 2-0 CT1 27 (SUTURE) ×2
SUT VIC AB 2-0 CT1 TAPERPNT 27 (SUTURE) ×1 IMPLANT
TOWEL GREEN STERILE (TOWEL DISPOSABLE) ×2 IMPLANT
TOWEL GREEN STERILE FF (TOWEL DISPOSABLE) ×2 IMPLANT
WATER STERILE IRR 1000ML POUR (IV SOLUTION) ×2 IMPLANT

## 2018-10-19 NOTE — Plan of Care (Signed)
  Problem: Pain Managment: Goal: General experience of comfort will improve Outcome: Progressing   

## 2018-10-19 NOTE — Progress Notes (Addendum)
Pelvis xrays demonstrate thickened lateral cortex of RIGHT subtrochanteric femur also consistent with stress fracture due to prolia therapy.  Left subtroch femoral fracture was most likely the result of an occult left subtroch stress fracture that was completed by the mechanical fall.  The prolia will need to be stopped for at least 3 months postoperatively and we will allow her PCP to discuss with patient to determine appropriate treatment alternatives.

## 2018-10-19 NOTE — Transfer of Care (Signed)
Immediate Anesthesia Transfer of Care Note  [name]  Procedure(s) Performed: INTRAMEDULLARY (IM) NAIL INTERTROCHANTRIC (Left Leg Upper)  Patient Location: PACU  Anesthesia Type:General  Level of Consciousness: awake, alert  and oriented  Airway & Oxygen Therapy: Patient Spontanous Breathing and Patient connected to nasal cannula oxygen  Post-op Assessment: Report given to RN, Post -op Vital signs reviewed and stable and Patient moving all extremities X 4  Post vital signs: Reviewed and stable  Last Vitals:  Vitals Value Taken Time  BP 131/73 10/19/18 0934  Temp    Pulse 86 10/19/18 0936  Resp 17 10/19/18 0936  SpO2 100 % 10/19/18 0936  Vitals shown include unvalidated device data.  Last Pain:  Vitals:   10/19/18 0534  TempSrc: Oral  PainSc:          Complications: No apparent anesthesia complications

## 2018-10-19 NOTE — Progress Notes (Signed)
CSW acknowledges SNF consult. The patient has pending PT/OT orders.   CSW will continue to follow and base disposition plan off of therapy recommendations.   Domenic Schwab, MSW, [place] Worker [facility]  [phone]

## 2018-10-19 NOTE — Progress Notes (Signed)
This RN attempted to insert a foley catheter but was unsuccessful. A second RN also attempted to insert a foley and was also unsuccessful.

## 2018-10-19 NOTE — Progress Notes (Signed)
PROGRESS NOTE    Becky Montoya  GNF:[id number] DOB: 11-24-1926 DOA: 10/18/2018 PCP: Harlan Stains, MD   Brief Narrative:  85 year old with history of essential hypertension, hypothyroidism and hyperlipidemia had a fall at home and suffered from left hip fracture.  She underwent surgical intervention on 10/19/2018.  She tolerated the procedure well.   Assessment & Plan:   Principal Problem:   Closed left subtrochanteric femur fracture, initial encounter [facility]) Active Problems:   Hypothyroidism   Hypertension   Hyperlipidemia   Closed left subtrochanteric femur fracture ([place])   Fall at home, initial encounter  Left hip fracture requiring subtrochanteric intramedullary implant -Status post repair 7/12 -.  DVT prophylaxis per orthopedic team. - Pain control, bowel regimen -PT/OT -Incentive spirometer.  Essential hypertension - Hydrochlorothiazide, losartan  Hypothyroidism - Synthroid 50 mcg daily  GERD -PPI as needed  Hyperlipidemia -Statin.  DVT prophylaxis: Xarelto Code Status: Full code Family Communication: None at bedside Disposition Plan: Pending PT evaluation, may need skilled nursing facility placement  Consultants:   Orthopedic, Dr Erlinda Hong  Procedures:   ORIF 7/12  Antimicrobials:   Perioperative cefazolin   Subjective: Patient reports of some left hip pain as expected postoperatively but otherwise no other complaints.  Review of Systems Otherwise negative except as per HPI, including: General: Denies fever, chills, night sweats or unintended weight loss. Resp: Denies cough, wheezing, shortness of breath. Cardiac: Denies chest pain, palpitations, orthopnea, paroxysmal nocturnal dyspnea. GI: Denies abdominal pain, nausea, vomiting, diarrhea or constipation GU: Denies dysuria, frequency, hesitancy or incontinence MS: Denies muscle aches, joint pain or swelling Neuro: Denies headache, neurologic deficits (focal weakness, numbness, tingling), abnormal  gait Psych: Denies anxiety, depression, SI/HI/AVH Skin: Denies new rashes or lesions ID: Denies sick contacts, exotic exposures, travel  Objective: Vitals:   10/19/18 0950 10/19/18 1005 10/19/18 1020 10/19/18 1043  BP: (!) 127/59 120/63 (!) 110/54 (!) 123/54  Pulse: 90 92 86 88  Resp: _0 Temp:   (!) 97.3 F (36.3 C) 98 F (36.7 C)  TempSrc:    Oral  SpO2: 97% 96% 94% 95%  Weight:      Height:        Intake/Output Summary (Last 24 hours) at 10/19/2018 1143 Last data filed at 10/19/2018 1112 Gross per 24 hour  Intake 1415.81 ml  Output 450 ml  Net 965.81 ml   Filed Weights   10/18/18 1932  Weight: 59 kg    Examination:  General exam: Appears calm and comfortable  Respiratory system: Clear to auscultation. Respiratory effort normal. Cardiovascular system: S1 & S2 heard, RRR. No JVD, murmurs, rubs, gallops or clicks. No pedal edema. Gastrointestinal system: Abdomen is nondistended, soft and nontender. No organomegaly or masses felt. Normal bowel sounds heard. Central nervous system: Alert and oriented. No focal neurological deficits. Extremities: Symmetric 4 x 5 power. Skin: Left hip dressing without any evidence of bleeding Psychiatry: Judgement and insight appear normal. Mood & affect appropriate.     Data Reviewed:   CBC: Recent Labs  Lab 10/18/18 1645  WBC 6.8  NEUTROABS 4.4  HGB 13.6  HCT 42.6  MCV 98.6  PLT 846   Basic Metabolic Panel: Recent Labs  Lab 10/18/18 1735 10/19/18 0517  NA 139 137  K 3.6 3.5  CL 101 104  CO2 25 24  GLUCOSE 136* 145*  BUN 13 14  CREATININE 0.64 0.73  CALCIUM 9.2 8.7*   GFR: Estimated Creatinine Clearance: 38.7 mL/min (by C-G formula based on SCr of 0.73  mg/dL). Liver Function Tests: No results for input(s): AST, ALT, ALKPHOS, BILITOT, PROT, ALBUMIN in the last 168 hours. No results for input(s): LIPASE, AMYLASE in the last 168 hours. No results for input(s): AMMONIA in the last 168 hours. Coagulation  Profile: No results for input(s): INR, PROTIME in the last 168 hours. Cardiac Enzymes: No results for input(s): CKTOTAL, CKMB, CKMBINDEX, TROPONINI in the last 168 hours. BNP (last 3 results) No results for input(s): PROBNP in the last 8760 hours. HbA1C: No results for input(s): HGBA1C in the last 72 hours. CBG: No results for input(s): GLUCAP in the last 168 hours. Lipid Profile: No results for input(s): CHOL, HDL, LDLCALC, TRIG, CHOLHDL, LDLDIRECT in the last 72 hours. Thyroid Function Tests: No results for input(s): TSH, T4TOTAL, FREET4, T3FREE, THYROIDAB in the last 72 hours. Anemia Panel: No results for input(s): VITAMINB12, FOLATE, FERRITIN, TIBC, IRON, RETICCTPCT in the last 72 hours. Sepsis Labs: No results for input(s): PROCALCITON, LATICACIDVEN in the last 168 hours.  Recent Results (from the past 240 hour(s))  SARS Coronavirus 2 (CEPHEID - Performed in [place] hospital lab), Hosp Order     Status: None   Collection Time: 10/18/18  5:12 PM   Specimen: Nasopharyngeal Swab  Result Value Ref Range Status   SARS Coronavirus 2 NEGATIVE NEGATIVE Final    Comment: (NOTE) If result is NEGATIVE SARS-CoV-2 target nucleic acids are NOT DETECTED. The SARS-CoV-2 RNA is generally detectable in upper and lower  respiratory specimens during the acute phase of infection. The lowest  concentration of SARS-CoV-2 viral copies this assay can detect is 250  copies / mL. A negative result does not preclude SARS-CoV-2 infection  and should not be used as the sole basis for treatment or other  patient management decisions.  A negative result may occur with  improper specimen collection / handling, submission of specimen other  than nasopharyngeal swab, presence of viral mutation(s) within the  areas targeted by this assay, and inadequate number of viral copies  (<250 copies / mL). A negative result must be combined with clinical  observations, patient history, and epidemiological  information. If result is POSITIVE SARS-CoV-2 target nucleic acids are DETECTED. The SARS-CoV-2 RNA is generally detectable in upper and lower  respiratory specimens dur ing the acute phase of infection.  Positive  results are indicative of active infection with SARS-CoV-2.  Clinical  correlation with patient history and other diagnostic information is  necessary to determine patient infection status.  Positive results do  not rule out bacterial infection or co-infection with other viruses. If result is PRESUMPTIVE POSTIVE SARS-CoV-2 nucleic acids MAY BE PRESENT.   A presumptive positive result was obtained on the submitted specimen  and confirmed on repeat testing.  While 2019 novel coronavirus  (SARS-CoV-2) nucleic acids may be present in the submitted sample  additional confirmatory testing may be necessary for epidemiological  and / or clinical management purposes  to differentiate between  SARS-CoV-2 and other Sarbecovirus currently known to infect humans.  If clinically indicated additional testing with an alternate test  methodology [phone]) is advised. The SARS-CoV-2 RNA is generally  detectable in upper and lower respiratory sp ecimens during the acute  phase of infection. The expected result is Negative. Fact Sheet for Patients:  StrictlyIdeas.no Fact Sheet for Healthcare Providers: BankingDealers.co.za This test is not yet approved or cleared by the [place] FDA and has been authorized for detection and/or diagnosis of SARS-CoV-2 by FDA under an Emergency Use Authorization (EUA).  This EUA  will remain in effect (meaning this test can be used) for the duration of the COVID-19 declaration under Section 564(b)(1) of the Act, 21 U.S.C. section 360bbb-3(b)(1), unless the authorization is terminated or revoked sooner. Performed at [facility], [place] [address] [place], [place] [address]   Surgical pcr  screen     Status: None   Collection Time: 10/19/18  1:28 AM   Specimen: Nasal Mucosa; Nasal Swab  Result Value Ref Range Status   MRSA, PCR NEGATIVE NEGATIVE Final   Staphylococcus aureus NEGATIVE NEGATIVE Final    Comment: (NOTE) The Xpert SA Assay (FDA approved for NASAL specimens in patients 5 years of age and older), is one component of a comprehensive surveillance program. It is not intended to diagnose infection nor to guide or monitor treatment. Performed at [facility], [place] [address], [place], [place] [address]          Radiology Studies: Pelvis Portable  Result Date: 10/19/2018 CLINICAL DATA:  Status post ORIF of a proximal left femur fracture. EXAM: PORTABLE PELVIS 1-2 VIEWS COMPARISON:  10/18/2018 FINDINGS: Single image shows a long intramedullary rod supporting 2 screws. Fracture of the proximal femur, which is below the intertrochanteric line, has been significantly reduced. Minimal residual displacement is noted, the proximal fracture component displaced 7-8 mm laterally. IMPRESSION: Status post ORIF of a proximal left femur fracture. Fracture has been significantly reduced with only minimal residual displacement. Electronically Signed   By: Lajean Manes M.D.   On: 10/19/2018 10:01   Chest Portable 1 View  Result Date: 10/18/2018 CLINICAL DATA:  Preoperative study.  Known hip fracture. EXAM: PORTABLE CHEST 1 VIEW COMPARISON:  None. FINDINGS: The heart size and mediastinal contours are within normal limits. Both lungs are clear. The visualized skeletal structures are unremarkable. IMPRESSION: No active disease. Electronically Signed   By: Dorise Bullion III M.D   On: 10/18/2018 19:06   Dg C-arm 1-60 Min  Result Date: 10/19/2018 CLINICAL DATA:  Status post ORIF of a left proximal femur fracture. EXAM: LEFT FEMUR 2 VIEWS; DG C-ARM 61-120 MIN COMPARISON:  None. FINDINGS: Portable images demonstrate placement of an intramedullary rod supporting compression screws,  reducing the fracture into near anatomic alignment. Orthopedic hardware appears well seated. There is no new fracture or evidence of an operative complication. IMPRESSION: Well aligned proximal left femur fracture following ORIF. Electronically Signed   By: Lajean Manes M.D.   On: 10/19/2018 10:00   Dg Hip Unilat With Pelvis 2-3 Views Left  Result Date: 10/18/2018 CLINICAL DATA:  Left hip pain post fall. EXAM: DG HIP (WITH OR WITHOUT PELVIS) 2-3V LEFT COMPARISON:  None. FINDINGS: Transverse fracture of the left proximal femur, just distal to the trochanteric region. The left hip is located with varus rotation of the proximal fracture fragment, and superior displacement of the distal fracture fragment. IMPRESSION: Displaced transverse fracture of the left proximal femur. Electronically Signed   By: Fidela Salisbury M.D.   On: 10/18/2018 17:26   Dg Femur Min 2 Views Left  Result Date: 10/19/2018 CLINICAL DATA:  Status post ORIF of a left proximal femur fracture. EXAM: LEFT FEMUR 2 VIEWS; DG C-ARM 61-120 MIN COMPARISON:  None. FINDINGS: Portable images demonstrate placement of an intramedullary rod supporting compression screws, reducing the fracture into near anatomic alignment. Orthopedic hardware appears well seated. There is no new fracture or evidence of an operative complication. IMPRESSION: Well aligned proximal left femur fracture following ORIF. Electronically Signed   By: Shanon Brow  Ormond M.D.   On: 10/19/2018 10:00   Dg Femur Min 2 Views Left  Result Date: 10/18/2018 CLINICAL DATA:  Recent fall with hip pain, initial encounter EXAM: LEFT FEMUR 2 VIEWS COMPARISON:  None. FINDINGS: There is a fracture through the proximal shaft of the left femur just below the intratrochanteric region with medial displacement of the distal femoral shaft with respect to the proximal femur. Considerable angulation is noted as well. No other fracture is seen. IMPRESSION: Fracture through the proximal left femur just  below the intratrochanteric region with angulation and displacement of the distal femoral shaft. Electronically Signed   By: Inez Catalina M.D.   On: 10/18/2018 17:25        Scheduled Meds: . acetaminophen  500 mg Oral Q6H  . docusate sodium  100 mg Oral BID  . fentaNYL      . losartan  100 mg Oral Daily   And  . hydrochlorothiazide  12.5 mg Oral Daily  . levothyroxine  50 mcg Oral Q0600  . midazolam  2 mg Intravenous Once  . midazolam PF  0.1 mg/kg Intravenous Once  . rivaroxaban  10 mg Oral Daily  . senna  1 tablet Oral BID  . simvastatin  40 mg Oral QPM  . vitamin B-12  1,000 mcg Oral Daily   Continuous Infusions: . sodium chloride    . ceFAZolin    .  ceFAZolin (ANCEF) IV    . lactated ringers Stopped (10/19/18 2440)  . methocarbamol (ROBAXIN) IV    . sodium chloride    . tranexamic acid       LOS: 1 day   Time spent= 35 mins    Tiaunna Buford Arsenio Loader, MD [facility]  If 7PM-7AM, please contact night-coverage [email or web address] 10/19/2018, 11:43 AM

## 2018-10-19 NOTE — H&P (Signed)

## 2018-10-19 NOTE — Progress Notes (Signed)
PT Cancellation Note  Patient Details Name: Becky Montoya MRN: [id number] DOB: November 11, 1926   Cancelled Treatment:    Reason Eval/Treat Not Completed: Patient at procedure or test/unavailable. Pt in OR for surgical repair of L femur fx. PT to follow up tomorrow.   Lorriane Shire 10/19/2018, 7:55 AM   Lorrin Goodell, PT  Office # [phone] Pager [phone]

## 2018-10-19 NOTE — Discharge Instructions (Addendum)
° ° °  1. Change dressings as needed 2. May shower but keep incisions covered and dry 3. Take xarelto to prevent blood clots 4. Take stool softeners as needed 5. Take pain meds as needed  I

## 2018-10-19 NOTE — Anesthesia Postprocedure Evaluation (Signed)
Anesthesia Post Note  [name]  Procedure(s) Performed: INTRAMEDULLARY (IM) NAIL INTERTROCHANTRIC (Left Leg Upper)     Patient location during evaluation: PACU Anesthesia Type: General Level of consciousness: awake and alert Pain management: pain level controlled Vital Signs Assessment: post-procedure vital signs reviewed and stable Respiratory status: spontaneous breathing, nonlabored ventilation, respiratory function stable and patient connected to nasal cannula oxygen Cardiovascular status: blood pressure returned to baseline and stable Postop Assessment: no apparent nausea or vomiting Anesthetic complications: no    Last Vitals:  Vitals:   10/19/18 1020 10/19/18 1043  BP: (!) 110/54 (!) 123/54  Pulse: 86 88  Resp: 13   Temp: (!) 36.3 C 36.7 C  SpO2: 94% 95%    Last Pain:  Vitals:   10/19/18 1043  TempSrc: Oral  PainSc: 2                  Effie Berkshire

## 2018-10-19 NOTE — Op Note (Signed)
Date of Surgery: 10/19/2018  INDICATIONS: Becky Montoya is a 85 y.o.-year-old female who sustained a left hip fracture. The risks and benefits of the procedure discussed with the patient prior to the procedure and all questions were answered; consent was obtained.  PREOPERATIVE DIAGNOSIS: left subtrochanteric femur fracture   POSTOPERATIVE DIAGNOSIS: Same   PROCEDURE: Treatment of subtrochanteric fracture with intramedullary implant. CPT [phone]   SURGEON: N. Eduard Roux, M.D.   ASSIST: Ciro Backer [place], [place]; necessary for the timely completion of procedure and due to complexity of procedure.  ANESTHESIA: general   IV FLUIDS AND URINE: See anesthesia record   ESTIMATED BLOOD LOSS: 150 cc  IMPLANTS: [employer] InterTAN 10 x 36, 85/80  DRAINS: None.   COMPLICATIONS: see description of procedure.   DESCRIPTION OF PROCEDURE: The patient was brought to the operating room and placed supine on the operating table. The patient's leg had been signed prior to the procedure. The patient had the anesthesia placed by the anesthesiologist. The prep verification and incision time-outs were performed to confirm that this was the correct patient, site, side and location. The patient had an SCD on the opposite lower extremity. The patient did receive antibiotics prior to the incision and was re-dosed during the procedure as needed at indicated intervals. The patient was positioned on the fracture table with the table in traction and internal rotation to reduce the hip. The well leg was placed in a scissor position and all bony prominences were well-padded. The patient had the lower extremity prepped and draped in the standard surgical fashion.  I first began by placing a Schanz pin through the basicervical region superiorly under fluoroscopic guidance bicortically.  The Schanz pin was used to manipulate the proximal segment out of the deformity and back into alignment.  Once the fracture was reduced  I then made a separate incision superior to the greater trochanter.  The incision was made 4 finger breadths superior to the greater trochanter. A guide pin was inserted into the tip of the greater trochanter under fluoroscopic guidance. An opening reamer was used to gain access to the femoral canal.  A reducer was used to align the fracture fragments and a guide wire was inserted down the canal.  Sequential reaming was performed until adequate chatter.  The nail length was measured and inserted down the femoral canal to its proper depth. The appropriate version of insertion for the lag screw was found under fluoroscopy. A pin was inserted up the femoral neck through the jig. Then, a second antirotation pin was inserted inferior to the first pin. The length of the lag screw was then measured. The lag screw was inserted as near to center-center in the head as possible. The antirotation pin was then taken out and an interdigitating compression screw was placed in its place. The leg was taken out of traction, then the interdigitating compression screw was used to compress across the fracture.  The Schanz pin was then removed.  2 distal interlocking screws were placed using the perfect circle technique.  She had excellent bone quality.  The wound was copiously irrigated with saline and the subcutaneous layer closed with 2.0 vicryl and the skin was reapproximated with staples. The wounds were cleaned and dried a final time and a sterile dressing was placed. The hip was taken through a range of motion at the end of the case under fluoroscopic imaging to visualize the approach-withdraw phenomenon and confirm implant length in the head. The patient was  then awakened from anesthesia and taken to the recovery room in stable condition. All counts were correct at the end of the case.   POSTOPERATIVE PLAN: The patient will be weight bearing as tolerated and will return in 2 weeks for staple removal and the patient will receive  DVT prophylaxis based on other medications, activity level, and risk ratio of bleeding to thrombosis.   Becky Cecil, MD [place] 9:00 AM

## 2018-10-19 NOTE — Anesthesia Procedure Notes (Signed)
Procedure Name: Intubation Date/Time: 10/19/2018 7:33 AM Performed by: Neldon Newport, CRNA Pre-anesthesia Checklist: Timeout performed, Patient being monitored, Suction available, Emergency Drugs available and Patient identified Patient Re-evaluated:Patient Re-evaluated prior to induction Oxygen Delivery Method: Circle system utilized Preoxygenation: Pre-oxygenation with 100% oxygen Induction Type: IV induction Ventilation: Mask ventilation without difficulty Laryngoscope Size: Mac and 3 Grade View: Grade I Tube type: Oral Tube size: 7.0 mm Number of attempts: 1 Placement Confirmation: ETT inserted through vocal cords under direct vision,  positive ETCO2 and breath sounds checked- equal and bilateral Secured at: 22 cm Tube secured with: Tape Dental Injury: Teeth and Oropharynx as per pre-operative assessment

## 2018-10-19 NOTE — Progress Notes (Signed)
Spoke with pt's husband and updated on POC.

## 2018-10-19 NOTE — Progress Notes (Signed)
Pt transferred from [facility] and arrived on the unit at  this time Pt is alert and oriented x 3.Pt transferred to bed. Pt denies any pain. Call bell is within reach. Will continue to monitor

## 2018-10-19 NOTE — Anesthesia Preprocedure Evaluation (Addendum)
Anesthesia Evaluation  Patient identified by MRN, date of birth, ID band Patient awake    Reviewed: Allergy & Precautions, NPO status , Patient's Chart, lab work & pertinent test results  Airway Mallampati: I  TM Distance: >3 FB Neck ROM: Full    Dental  (+) Teeth Intact, Dental Advisory Given   Pulmonary neg pulmonary ROS,    breath sounds clear to auscultation       Cardiovascular hypertension, Pt. on medications  Rhythm:Regular Rate:Normal     Neuro/Psych negative neurological ROS  negative psych ROS   GI/Hepatic negative GI ROS, Neg liver ROS,   Endo/Other  Hypothyroidism   Renal/GU negative Renal ROS     Musculoskeletal  (+) Arthritis ,   Abdominal Normal abdominal exam  (+)   Peds  Hematology   Anesthesia Other Findings - HLD  Reproductive/Obstetrics                            Lab Results  Component Value Date   WBC 6.8 10/18/2018   HGB 13.6 10/18/2018   HCT 42.6 10/18/2018   MCV 98.6 10/18/2018   PLT 180 10/18/2018   No results found for: INR, PROTIME  EKG: normal sinus rhythm.  Anesthesia Physical Anesthesia Plan  ASA: III  Anesthesia Plan: General   Post-op Pain Management:    Induction: Intravenous  PONV Risk Score and Plan: 4 or greater and Ondansetron and Treatment may vary due to age or medical condition  Airway Management Planned: Oral ETT  Additional Equipment: None  Intra-op Plan:   Post-operative Plan: Extubation in OR  Informed Consent: I have reviewed the patients History and Physical, chart, labs and discussed the procedure including the risks, benefits and alternatives for the proposed anesthesia with the patient or authorized representative who has indicated his/her understanding and acceptance.     Dental advisory given  Plan Discussed with: CRNA  Anesthesia Plan Comments:        Anesthesia Quick Evaluation

## 2018-10-20 ENCOUNTER — Inpatient Hospital Stay (HOSPITAL_COMMUNITY): Payer: Medicare Other

## 2018-10-20 ENCOUNTER — Encounter (HOSPITAL_COMMUNITY): Payer: Self-pay | Admitting: Orthopaedic Surgery

## 2018-10-20 LAB — CBC
HCT: 27.5 % — ABNORMAL LOW (ref 36.0–46.0)
Hemoglobin: 9.1 g/dL — ABNORMAL LOW (ref 12.0–15.0)
MCH: 30.8 pg (ref 26.0–34.0)
MCHC: 33.1 g/dL (ref 30.0–36.0)
MCV: 93.2 fL (ref 80.0–100.0)
Platelets: 183 10*3/uL (ref 150–400)
RBC: 2.95 MIL/uL — ABNORMAL LOW (ref 3.87–5.11)
RDW: 12.2 % (ref 11.5–15.5)
WBC: 10.5 10*3/uL (ref 4.0–10.5)
nRBC: 0 % (ref 0.0–0.2)

## 2018-10-20 LAB — BASIC METABOLIC PANEL
Anion gap: 9 (ref 5–15)
BUN: 14 mg/dL (ref 8–23)
CO2: 23 mmol/L (ref 22–32)
Calcium: 7.7 mg/dL — ABNORMAL LOW (ref 8.9–10.3)
Chloride: 103 mmol/L (ref 98–111)
Creatinine, Ser: 0.74 mg/dL (ref 0.44–1.00)
GFR calc Af Amer: >60 mL/min (ref >60)
GFR calc non Af Amer: >60 mL/min (ref >60)
Glucose, Bld: 114 mg/dL — ABNORMAL HIGH (ref 70–99)
Potassium: 3.6 mmol/L (ref 3.5–5.1)
Sodium: 135 mmol/L (ref 135–145)

## 2018-10-20 LAB — MAGNESIUM: Magnesium: 2.2 mg/dL (ref 1.7–2.4)

## 2018-10-20 MED ORDER — CALCIUM CARBONATE-VITAMIN D 500-200 MG-UNIT PO TABS
1.0000 | ORAL_TABLET | Freq: Three times a day (TID) | ORAL | Status: DC
  Administered 2018-10-20 – 2018-10-24 (×11): 1 via ORAL
  Filled 2018-10-20 (×11): qty 1

## 2018-10-20 NOTE — Evaluation (Signed)
Physical Therapy Evaluation Patient Details Name: Becky Montoya MRN: [id number] DOB: 1927-02-14 Today's Date: 10/20/2018   History of Present Illness  Becky Montoya is a 85 y.o. female with medical history significant of well-managed hypertension, hypothyroid disease, hyperlipidemia. Admitted s/p fall sustaining L hip fracture, pt underwent L IM Nail. Pt now also with intermittent R hip/groin pain and is pending MRI for possible pathologic fracture. Per Dr. Phoebe Sharps note pt currently WBAT.  Clinical Impression  Pt admitted with above. Pt awaiting MRI of R hip/pelvis. Per Dr. Erlinda Hong pt currently WBAT. Pt able to tolerate transfer to EOB and std pvt to/from [facility] with minimal pain and assist. Pt very active PTA and demonstrates excellent rehab potential. Recommending CIR upon d/c to achieve safe mod I level of function for safe transition home. Acute PT to cont to follow.     Follow Up Recommendations CIR    Equipment Recommendations  None recommended by PT(TBD at next venue)    Recommendations for Other Services Rehab consult     Precautions / Restrictions Precautions Precautions: Fall Precaution Comments: pt pending R hip MRI for possible fracture Restrictions Weight Bearing Restrictions: Yes LLE Weight Bearing: Weight bearing as tolerated Other Position/Activity Restrictions: Pt pending MRI for R LE due to possible pathologic fracture, per Dr. Erlinda Hong notes pt remains WBAT      Mobility  Bed Mobility Overal bed mobility: Needs Assistance Bed Mobility: Supine to Sit;Sit to Supine     Supine to sit: Min assist;+2 for physical assistance Sit to supine: Min assist;+2 for physical assistance   General bed mobility comments: assist for LE management off EOB and back into bed, min/modA for trunk elevation to sit EOB  Transfers Overall transfer level: Needs assistance Equipment used: Rolling walker (2 wheeled) Transfers: Sit to/from [facility] Sit to Stand: Min assist;+2  physical assistance Stand pivot transfers: Min assist;+2 physical assistance       General transfer comment: max directional v/cs, pt with increased dependence on RW due to L IM Nail and possible R hip fracture. Pt able to clear L foot but only able to slide R foot due to decreased WBing tolerance on L LE with onset of pain at 5/10 compared to 1/10 at rest  Ambulation/Gait             General Gait Details: completed 2 std pvt transfers to/from [facility], pt waiting to be taken to MRI  Stairs            Wheelchair Mobility    Modified Rankin (Stroke Patients Only)       Balance Overall balance assessment: Needs assistance Sitting-balance support: Feet supported;No upper extremity supported Sitting balance-Leahy Scale: Good     Standing balance support: Bilateral upper extremity supported Standing balance-Leahy Scale: Poor Standing balance comment: dependent on RW                             Pertinent Vitals/Pain Pain Assessment: 0-10 Pain Score: 2  Pain Location: L LE, 5 in standing Pain Descriptors / Indicators: Discomfort Pain Intervention(s): Monitored during session    Home Living Family/patient expects to be discharged to:: Private residence Living Arrangements: Spouse/significant other Available Help at Discharge: Family;Available PRN/intermittently(spouse present but can't assist due to cognition) Type of Home: House(condo) Home Access: Level entry     Home Layout: One level Home Equipment: Walker - 2 wheels;Cane - single point;Shower seat;Grab bars - tub/shower  Prior Function Level of Independence: Independent         Comments: spouse does assist with washing of dishes and making of bed, pt was using seat in s hower and RW for ambulation since last fall     Hand Dominance   Dominant Hand: Right    Extremity/Trunk Assessment   Upper Extremity Assessment Upper Extremity Assessment: Defer to OT evaluation    Lower Extremity  Assessment Lower Extremity Assessment: RLE deficits/detail;LLE deficits/detail RLE Deficits / Details: pt able to complete LAQ and quad set, unable to complete MMT due to pain with resistance LLE Deficits / Details: pt able to complete LAQ and quad set, unable to complete MMT due to pain with resistance    Cervical / Trunk Assessment Cervical / Trunk Assessment: Normal  Communication   Communication: No difficulties  Cognition Arousal/Alertness: Awake/alert Behavior During Therapy: WFL for tasks assessed/performed Overall Cognitive Status: Within Functional Limits for tasks assessed                                 General Comments: pt very aware of deficits       General Comments General comments (skin integrity, edema, etc.): incision with dressing on it    Exercises General Exercises - Lower Extremity Ankle Circles/Pumps: AROM;Both;10 reps;Supine Quad Sets: AROM;Both;10 reps;Supine Gluteal Sets: AROM;Both;10 reps;Supine   Assessment/Plan    PT Assessment Patient needs continued PT services  PT Problem List Decreased strength;Decreased activity tolerance;Decreased range of motion;Decreased balance;Decreased mobility;Decreased coordination;Decreased cognition       PT Treatment Interventions DME instruction;Gait training;Stair training;Functional mobility training;Therapeutic activities;Therapeutic exercise;Balance training    PT Goals (Current goals can be found in the Care Plan section)  Acute Rehab PT Goals Patient Stated Goal: to get better PT Goal Formulation: With patient Time For Goal Achievement: 11/03/18 Potential to Achieve Goals: Good    Frequency Min 5X/week   Barriers to discharge        Co-evaluation PT/OT/SLP Co-Evaluation/Treatment: Yes Reason for Co-Treatment: Complexity of the patient's impairments (multi-system involvement) PT goals addressed during session: Mobility/safety with mobility         AM-PAC PT "6 Clicks" Mobility   Outcome Measure Help needed turning from your back to your side while in a flat bed without using bedrails?: A Lot Help needed moving from lying on your back to sitting on the side of a flat bed without using bedrails?: A Lot Help needed moving to and from a bed to a chair (including a wheelchair)?: A Little Help needed standing up from a chair using your arms (e.g., wheelchair or bedside chair)?: A Little Help needed to walk in hospital room?: A Little Help needed climbing 3-5 steps with a railing? : A Lot 6 Click Score: 15    End of Session Equipment Utilized During Treatment: Gait belt Activity Tolerance: Patient tolerated treatment well Patient left: in bed;with call bell/phone within reach;with bed alarm set Nurse Communication: Mobility status PT Visit Diagnosis: Unsteadiness on feet (R26.81);Difficulty in walking, not elsewhere classified (R26.2)    Time: 6962-9528 PT Time Calculation (min) (ACUTE ONLY): 38 min   Charges:   PT Evaluation $PT Eval Moderate Complexity: 1 Mod          Kittie Plater, PT, DPT Acute Rehabilitation Services Pager #: [phone] Office #: [phone]   Berline Lopes 10/20/2018, 10:27 AM

## 2018-10-20 NOTE — Progress Notes (Signed)
Inpatient Rehab Admissions:  Inpatient Rehab Consult received.  I met with pt at the bedside for rehabilitation assessment and to discuss goals and expectations of an inpatient rehab admission. Pt interested in the program but would like for [facility] to discuss the program with her daughter and see what type of support her daughter may feel comfortable providing. Anticipate pt will need intermittent supervision at [place] call back from daughter at this time to see if a short CIR stay is appropriate.   Jhonnie Garner, OTR/L  Rehab Admissions Coordinator  [phone] 10/20/2018 3:38 PM

## 2018-10-20 NOTE — Progress Notes (Addendum)
Subjective: 1 Day Post-Op Procedure(s) (LRB): INTRAMEDULLARY (IM) NAIL INTERTROCHANTRIC (Left) Patient reports pain as moderate to LLE.   She does note intermittent pain to the right groin which has increased over the past 1-2 days.  She has been on prolia for the past few years.  She notes hx of right hip fracture in 2012 that was treated non-operatively.  Pelvis xrays demonstrate thickened lateral cortex of RIGHT subtrochanteric femur also consistent with stress fracture due to prolia therapy.  Left subtroch femoral fracture was most likely the result of an occult left subtroch stress fracture that was completed by the mechanical fal   Objective: Vital signs in last 24 hours: Temp:  [97.3 F (36.3 C)-98.7 F (37.1 C)] 98.4 F (36.9 C) (07/13 0457) Pulse Rate:  [86-103] 103 (07/13 0457) Resp:  [13-19] 19 (07/13 0457) BP: (110-134)/(52-73) 134/59 (07/13 0457) SpO2:  [92 %-99 %] 93 % (07/13 0457)  Intake/Output from previous day: 07/12 0701 - 07/13 0700 In: 2143.6 [P.O.:720; I.V.:1225.1; IV Piggyback:198.5] Out: 1250 [Urine:1050; Blood:200] Intake/Output this shift: No intake/output data recorded.  Recent Labs    10/18/18 1645 10/20/18 0459  HGB 13.6 9.1*   Recent Labs    10/18/18 1645 10/20/18 0459  WBC 6.8 10.5  RBC 4.32 2.95*  HCT 42.6 27.5*  PLT 180 183   Recent Labs    10/19/18 0517 10/20/18 0459  NA 137 135  K 3.5 3.6  CL 104 103  CO2 24 23  BUN 14 14  CREATININE 0.73 0.74  GLUCOSE 145* 114*  CALCIUM 8.7* 7.7*   No results for input(s): LABPT, INR in the last 72 hours.  Neurologically intact Neurovascular intact Sensation intact distally Intact pulses distally Dorsiflexion/Plantar flexion intact Incision: scant drainage No cellulitis present Compartment soft   Assessment/Plan: 1 Day Post-Op Procedure(s) (LRB): INTRAMEDULLARY (IM) NAIL INTERTROCHANTRIC (Left) Up with therapy (will need to use walker) WBAT BLE ABLA- mild and stable The prolia  will need to be stopped for at least 3 months postoperatively and we will allow her PCP to discuss with patient to determine appropriate treatment alternatives.   Dr. Erlinda Hong will further evaluate patient later this am.  Lovenox 40 mg daily x 14 days     Aundra Dubin 10/20/2018, 7:08 AM

## 2018-10-20 NOTE — Plan of Care (Signed)

## 2018-10-20 NOTE — Progress Notes (Signed)
Rehab Admissions Coordinator Note:  Patient was screened by Jhonnie Garner for appropriateness for an Inpatient Acute Rehab Consult.  At this time, we are recommending Inpatient Rehab consult. AC will contact MD to request order.   Jhonnie Garner 10/20/2018, 11:46 AM  I can be reached at [phone].

## 2018-10-20 NOTE — Progress Notes (Signed)
PROGRESS NOTE    Becky Montoya  WFU:[id number] DOB: 1926-06-28 DOA: 10/18/2018 PCP: Harlan Stains, MD   Brief Narrative:  85 year old with history of essential hypertension, hypothyroidism and hyperlipidemia had a fall at home and suffered from left hip fracture.  She underwent surgical intervention on 10/19/2018.  She tolerated the procedure well.   Assessment & Plan:   Principal Problem:   Closed left subtrochanteric femur fracture, initial encounter [facility]) Active Problems:   Hypothyroidism   Hypertension   Hyperlipidemia   Closed left subtrochanteric femur fracture ([place])   Fall at home, initial encounter  Left hip fracture requiring subtrochanteric intramedullary implant Right hip pain with impending pathologic fracture -Status post repair 7/12 -DVT prophylaxis per orthopedic team-awaiting their exact recommendations - Pain control, bowel regimen -Working with physical therapy/Occupational Therapy team -Incentive spirometer - MRI of the right hip ordered by orthopedic -Prolia to be held per orthopedic for at least 3 months  Essential hypertension - Hydrochlorothiazide, losartan  Hypothyroidism - Synthroid 50 mcg daily  GERD -PPI as needed  Hyperlipidemia -Statin.  DVT prophylaxis: Xarelto Code Status: Full code Family Communication: None at bedside Disposition Plan: Pending PT evaluation and right hip pain evaluation.  Eventual plans for depending on physical therapy.  Consultants:   Orthopedic, Dr Erlinda Hong  Procedures:   ORIF 7/12  Antimicrobials:   Perioperative cefazolin   Subjective: Reporting of some right hip pain otherwise doing well postoperatively.  No complaints.  Review of Systems Otherwise negative except as per HPI, including: General = no fevers, chills, dizziness, malaise, fatigue HEENT/EYES = negative for pain, redness, loss of vision, double vision, blurred vision, loss of hearing, sore throat, hoarseness, dysphagia Cardiovascular=  negative for chest pain, palpitation, murmurs, lower extremity swelling Respiratory/lungs= negative for shortness of breath, cough, hemoptysis, wheezing, mucus production Gastrointestinal= negative for nausea, vomiting,, abdominal pain, melena, hematemesis Genitourinary= negative for Dysuria, Hematuria, Change in Urinary Frequency MSK = right hip pain Neurology= Negative for headache, seizures, numbness, tingling  Psychiatry= Negative for anxiety, depression, suicidal and homocidal ideation Allergy/Immunology= Medication/Food allergy as listed  Skin= Negative for Rash, lesions, ulcers, itching   Objective: Vitals:   10/19/18 1043 10/19/18 2027 10/20/18 0033 10/20/18 0457  BP: (!) 123/54 (!) 129/59 (!) 123/52 (!) 134/59  Pulse: 88 100 94 (!) 103  Resp:  _0 Temp: 98 F (36.7 C) 98.5 F (36.9 C) 98.7 F (37.1 C) 98.4 F (36.9 C)  TempSrc: Oral Oral Oral Oral  SpO2: 95% 95% 92% 93%  Weight:      Height:        Intake/Output Summary (Last 24 hours) at 10/20/2018 0958 Last data filed at 10/20/2018 0550 Gross per 24 hour  Intake 793.57 ml  Output 1050 ml  Net -256.43 ml   Filed Weights   10/18/18 1932  Weight: 59 kg    Examination:  Constitutional: NAD, calm, comfortable Eyes: PERRL, lids and conjunctivae normal ENMT: Mucous membranes are moist. Posterior pharynx clear of any exudate or lesions.Normal dentition.  Neck: normal, supple, no masses, no thyromegaly Respiratory: clear to auscultation bilaterally, no wheezing, no crackles. Normal respiratory effort. No accessory muscle use.  Cardiovascular: Regular rate and rhythm, no murmurs / rubs / gallops. No extremity edema. 2+ pedal pulses. No carotid bruits.  Abdomen: no tenderness, no masses palpated. No hepatosplenomegaly. Bowel sounds positive.  Musculoskeletal: Limited range of motion of bilateral lower extremity secondary to right hip pain Left hip surgery Skin: Surgical dressing noted without any evidence of  bleeding  or infection. Neurologic: CN 2-12 grossly intact. Sensation intact, DTR normal. Strength 5/5 in all 4.  Psychiatric: Normal judgment and insight. Alert and oriented x 3. Normal mood.    Data Reviewed:   CBC: Recent Labs  Lab 10/18/18 1645 10/20/18 0459  WBC 6.8 10.5  NEUTROABS 4.4  --   HGB 13.6 9.1*  HCT 42.6 27.5*  MCV 98.6 93.2  PLT 180 454   Basic Metabolic Panel: Recent Labs  Lab 10/18/18 1735 10/19/18 0517 10/20/18 0459  NA 139 137 135  K 3.6 3.5 3.6  CL 101 104 103  CO2 _0 GLUCOSE 136* 145* 114*  BUN _1 CREATININE 0.64 0.73 0.74  CALCIUM 9.2 8.7* 7.7*  MG  --   --  2.2   GFR: Estimated Creatinine Clearance: 38.7 mL/min (by C-G formula based on SCr of 0.74 mg/dL). Liver Function Tests: No results for input(s): AST, ALT, ALKPHOS, BILITOT, PROT, ALBUMIN in the last 168 hours. No results for input(s): LIPASE, AMYLASE in the last 168 hours. No results for input(s): AMMONIA in the last 168 hours. Coagulation Profile: No results for input(s): INR, PROTIME in the last 168 hours. Cardiac Enzymes: No results for input(s): CKTOTAL, CKMB, CKMBINDEX, TROPONINI in the last 168 hours. BNP (last 3 results) No results for input(s): PROBNP in the last 8760 hours. HbA1C: No results for input(s): HGBA1C in the last 72 hours. CBG: No results for input(s): GLUCAP in the last 168 hours. Lipid Profile: No results for input(s): CHOL, HDL, LDLCALC, TRIG, CHOLHDL, LDLDIRECT in the last 72 hours. Thyroid Function Tests: No results for input(s): TSH, T4TOTAL, FREET4, T3FREE, THYROIDAB in the last 72 hours. Anemia Panel: No results for input(s): VITAMINB12, FOLATE, FERRITIN, TIBC, IRON, RETICCTPCT in the last 72 hours. Sepsis Labs: No results for input(s): PROCALCITON, LATICACIDVEN in the last 168 hours.  Recent Results (from the past 240 hour(s))  SARS Coronavirus 2 (CEPHEID - Performed in [place] hospital lab), Hosp Order     Status: None    Collection Time: 10/18/18  5:12 PM   Specimen: Nasopharyngeal Swab  Result Value Ref Range Status   SARS Coronavirus 2 NEGATIVE NEGATIVE Final    Comment: (NOTE) If result is NEGATIVE SARS-CoV-2 target nucleic acids are NOT DETECTED. The SARS-CoV-2 RNA is generally detectable in upper and lower  respiratory specimens during the acute phase of infection. The lowest  concentration of SARS-CoV-2 viral copies this assay can detect is 250  copies / mL. A negative result does not preclude SARS-CoV-2 infection  and should not be used as the sole basis for treatment or other  patient management decisions.  A negative result may occur with  improper specimen collection / handling, submission of specimen other  than nasopharyngeal swab, presence of viral mutation(s) within the  areas targeted by this assay, and inadequate number of viral copies  (<250 copies / mL). A negative result must be combined with clinical  observations, patient history, and epidemiological information. If result is POSITIVE SARS-CoV-2 target nucleic acids are DETECTED. The SARS-CoV-2 RNA is generally detectable in upper and lower  respiratory specimens dur ing the acute phase of infection.  Positive  results are indicative of active infection with SARS-CoV-2.  Clinical  correlation with patient history and other diagnostic information is  necessary to determine patient infection status.  Positive results do  not rule out bacterial infection or co-infection with other viruses. If result is PRESUMPTIVE POSTIVE SARS-CoV-2 nucleic acids MAY BE PRESENT.  A presumptive positive result was obtained on the submitted specimen  and confirmed on repeat testing.  While 2019 novel coronavirus  (SARS-CoV-2) nucleic acids may be present in the submitted sample  additional confirmatory testing may be necessary for epidemiological  and / or clinical management purposes  to differentiate between  SARS-CoV-2 and other Sarbecovirus  currently known to infect humans.  If clinically indicated additional testing with an alternate test  methodology [phone]) is advised. The SARS-CoV-2 RNA is generally  detectable in upper and lower respiratory sp ecimens during the acute  phase of infection. The expected result is Negative. Fact Sheet for Patients:  StrictlyIdeas.no Fact Sheet for Healthcare Providers: BankingDealers.co.za This test is not yet approved or cleared by the [place] FDA and has been authorized for detection and/or diagnosis of SARS-CoV-2 by FDA under an Emergency Use Authorization (EUA).  This EUA will remain in effect (meaning this test can be used) for the duration of the COVID-19 declaration under Section 564(b)(1) of the Act, 21 U.S.C. section 360bbb-3(b)(1), unless the authorization is terminated or revoked sooner. Performed at [facility], [place] [address] [place], [place] [address]   Surgical pcr screen     Status: None   Collection Time: 10/19/18  1:28 AM   Specimen: Nasal Mucosa; Nasal Swab  Result Value Ref Range Status   MRSA, PCR NEGATIVE NEGATIVE Final   Staphylococcus aureus NEGATIVE NEGATIVE Final    Comment: (NOTE) The Xpert SA Assay (FDA approved for NASAL specimens in patients 49 years of age and older), is one component of a comprehensive surveillance program. It is not intended to diagnose infection nor to guide or monitor treatment. Performed at [facility], [place] [address] [place], [place] [address]          Radiology Studies: Pelvis Portable  Result Date: 10/19/2018 CLINICAL DATA:  Status post ORIF of a proximal left femur fracture. EXAM: PORTABLE PELVIS 1-2 VIEWS COMPARISON:  10/18/2018 FINDINGS: Single image shows a long intramedullary rod supporting 2 screws. Fracture of the proximal femur, which is below the intertrochanteric line, has been significantly reduced. Minimal residual  displacement is noted, the proximal fracture component displaced 7-8 mm laterally. IMPRESSION: Status post ORIF of a proximal left femur fracture. Fracture has been significantly reduced with only minimal residual displacement. Electronically Signed   By: Lajean Manes M.D.   On: 10/19/2018 10:01   Chest Portable 1 View  Result Date: 10/18/2018 CLINICAL DATA:  Preoperative study.  Known hip fracture. EXAM: PORTABLE CHEST 1 VIEW COMPARISON:  None. FINDINGS: The heart size and mediastinal contours are within normal limits. Both lungs are clear. The visualized skeletal structures are unremarkable. IMPRESSION: No active disease. Electronically Signed   By: Dorise Bullion III M.D   On: 10/18/2018 19:06   Dg C-arm 1-60 Min  Result Date: 10/19/2018 CLINICAL DATA:  Status post ORIF of a left proximal femur fracture. EXAM: LEFT FEMUR 2 VIEWS; DG C-ARM 61-120 MIN COMPARISON:  None. FINDINGS: Portable images demonstrate placement of an intramedullary rod supporting compression screws, reducing the fracture into near anatomic alignment. Orthopedic hardware appears well seated. There is no new fracture or evidence of an operative complication. IMPRESSION: Well aligned proximal left femur fracture following ORIF. Electronically Signed   By: Lajean Manes M.D.   On: 10/19/2018 10:00   Dg Hip Unilat With Pelvis 2-3 Views Left  Result Date: 10/18/2018 CLINICAL DATA:  Left hip pain post fall. EXAM: DG HIP (WITH OR WITHOUT PELVIS) 2-3V  LEFT COMPARISON:  None. FINDINGS: Transverse fracture of the left proximal femur, just distal to the trochanteric region. The left hip is located with varus rotation of the proximal fracture fragment, and superior displacement of the distal fracture fragment. IMPRESSION: Displaced transverse fracture of the left proximal femur. Electronically Signed   By: Fidela Salisbury M.D.   On: 10/18/2018 17:26   Dg Femur Min 2 Views Left  Result Date: 10/19/2018 CLINICAL DATA:  Status post ORIF  of a left proximal femur fracture. EXAM: LEFT FEMUR 2 VIEWS; DG C-ARM 61-120 MIN COMPARISON:  None. FINDINGS: Portable images demonstrate placement of an intramedullary rod supporting compression screws, reducing the fracture into near anatomic alignment. Orthopedic hardware appears well seated. There is no new fracture or evidence of an operative complication. IMPRESSION: Well aligned proximal left femur fracture following ORIF. Electronically Signed   By: Lajean Manes M.D.   On: 10/19/2018 10:00   Dg Femur Min 2 Views Left  Result Date: 10/18/2018 CLINICAL DATA:  Recent fall with hip pain, initial encounter EXAM: LEFT FEMUR 2 VIEWS COMPARISON:  None. FINDINGS: There is a fracture through the proximal shaft of the left femur just below the intratrochanteric region with medial displacement of the distal femoral shaft with respect to the proximal femur. Considerable angulation is noted as well. No other fracture is seen. IMPRESSION: Fracture through the proximal left femur just below the intratrochanteric region with angulation and displacement of the distal femoral shaft. Electronically Signed   By: Inez Catalina M.D.   On: 10/18/2018 17:25        Scheduled Meds:  acetaminophen  500 mg Oral Q6H   docusate sodium  100 mg Oral BID   losartan  100 mg Oral Daily   And   hydrochlorothiazide  12.5 mg Oral Daily   levothyroxine  50 mcg Oral Q0600   midazolam  2 mg Intravenous Once   midazolam PF  0.1 mg/kg Intravenous Once   rivaroxaban  10 mg Oral Daily   senna  1 tablet Oral BID   simvastatin  40 mg Oral QPM   vitamin B-12  1,000 mcg Oral Daily   Continuous Infusions:  sodium chloride 75 mL/hr (10/19/18 2040)   lactated ringers Stopped (10/19/18 0619)   methocarbamol (ROBAXIN) IV     sodium chloride       LOS: 2 days   Time spent= 25 mins    Kadisha Goodine Arsenio Loader, MD [facility]  If 7PM-7AM, please contact night-coverage [email or web address] 10/20/2018, 9:58 AM

## 2018-10-20 NOTE — Progress Notes (Signed)
Occupational Therapy Evaluation Patient Details Name: Becky Montoya MRN: [id number] DOB: 1927/03/06 Today's Date: 10/20/2018    History of Present Illness Becky Montoya is a 85 y.o. female with medical history significant of well-managed hypertension, hypothyroid disease, hyperlipidemia. Admitted s/p fall sustaining L hip fracture, pt underwent L IM Nail. Pt now also with intermittent R hip/groin pain and is pending MRI for possible pathologic fracture. Per Dr. Phoebe Sharps note pt currently WBAT.   Clinical Impression   PTA, pt was living at home with her husband, and reports independence with ADL required assistance from her husband for IADL and pt reports she was modified independent for functional mobility with intermittent use of cane. Pt awaiting MRI of R hip/pelvis, per Dr. Rhunette Croft, pt currently WBAT. Pt currently requires setupA for grooming while sitting EOB, minA+2 for toilet transfer at RW level, and minA+2 for LB ADL. Due to decline in current level of function, pt would benefit from acute OT to address established goals to facilitate safe D/C to venue listed below. At this time, recommend CIR follow-up to progress pt to mod independent level for safe d/c home. Will continue to follow acutely.     Follow Up Recommendations  CIR    Equipment Recommendations  3 in 1 bedside commode    Recommendations for Other Services       Precautions / Restrictions Precautions Precautions: Fall Precaution Comments: pt pending R hip MRI for possible fracture Restrictions Weight Bearing Restrictions: Yes LLE Weight Bearing: Weight bearing as tolerated Other Position/Activity Restrictions: Pt pending MRI for R LE due to possible pathologic fracture, per Dr. Erlinda Hong notes pt remains WBAT      Mobility Bed Mobility Overal bed mobility: Needs Assistance Bed Mobility: Supine to Sit;Sit to Supine     Supine to sit: Min assist;+2 for physical assistance Sit to supine: Min assist;+2 for physical  assistance   General bed mobility comments: assist for LE management off EOB and back into bed, min/modA for trunk elevation to sit EOB  Transfers Overall transfer level: Needs assistance Equipment used: Rolling walker (2 wheeled) Transfers: Sit to/from [facility] Sit to Stand: Min assist;+2 physical assistance Stand pivot transfers: Min assist;+2 physical assistance       General transfer comment: max directional v/cs, pt with increased dependence on RW due to L IM Nail and possible R hip fracture. Pt able to clear L foot but only able to slide R foot due to decreased WBing tolerance on L LE with onset of pain at 5/10 compared to 1/10 at rest    Balance Overall balance assessment: Needs assistance Sitting-balance support: Feet supported;No upper extremity supported Sitting balance-Leahy Scale: Good     Standing balance support: Bilateral upper extremity supported Standing balance-Leahy Scale: Poor Standing balance comment: dependent on RW                           ADL either performed or assessed with clinical judgement   ADL Overall ADL's : Needs assistance/impaired Eating/Feeding: Set up;Sitting   Grooming: Set up;Sitting Grooming Details (indicate cue type and reason): pt completed oral care and hair brushing sitting EOB Upper Body Bathing: Set up;Sitting   Lower Body Bathing: Minimal assistance;+2 for physical assistance Lower Body Bathing Details (indicate cue type and reason): minA +2 for sit<>stand Upper Body Dressing : Set up;Sitting   Lower Body Dressing: Minimal assistance;+2 for physical assistance;Sit to/from stand   Toilet Transfer: Minimal assistance;+2 for physical assistance;RW;Stand-pivot  Toilet Transfer Details (indicate cue type and reason): stand pivot from EOB to recliner  Toileting- Clothing Manipulation and Hygiene: Minimal assistance;+2 for physical assistance;Sit to/from stand       Functional mobility during ADLs:  Minimal assistance;+2 for physical assistance;Rolling walker General ADL Comments: limited by pain;possible fx in RLE pending MRI     Vision Baseline Vision/History: Wears glasses Patient Visual Report: No change from baseline       Perception     Praxis      Pertinent Vitals/Pain Pain Assessment: 0-10 Pain Score: 2  Pain Location: L LE, 5 in standing Pain Descriptors / Indicators: Discomfort Pain Intervention(s): Monitored during session;Limited activity within patient's tolerance     Hand Dominance Right   Extremity/Trunk Assessment Upper Extremity Assessment Upper Extremity Assessment: Generalized weakness;RUE deficits/detail;LUE deficits/detail RUE Deficits / Details: arthritis in bilat. hands, pt able to use hands functionally LUE Deficits / Details: arthritis in bilat. hands, pt able to use hands functionally   Lower Extremity Assessment Lower Extremity Assessment: RLE deficits/detail;LLE deficits/detail;Defer to PT evaluation RLE Deficits / Details: pt able to complete LAQ and quad set, unable to complete MMT due to pain with resistance LLE Deficits / Details: pt able to complete LAQ and quad set, unable to complete MMT due to pain with resistance   Cervical / Trunk Assessment Cervical / Trunk Assessment: Normal   Communication Communication Communication: No difficulties   Cognition Arousal/Alertness: Awake/alert Behavior During Therapy: WFL for tasks assessed/performed Overall Cognitive Status: Within Functional Limits for tasks assessed                                 General Comments: pt very aware of deficits    General Comments  incision with dressing on it    Exercises Exercises: General Lower Extremity General Exercises - Lower Extremity Ankle Circles/Pumps: AROM;Both;10 reps;Supine Quad Sets: AROM;Both;10 reps;Supine Gluteal Sets: AROM;Both;10 reps;Supine   Shoulder Instructions      Home Living Family/patient expects to be  discharged to:: Private residence Living Arrangements: Spouse/significant other Available Help at Discharge: Family;Available PRN/intermittently(spouse present but can't assist due to cognition) Type of Home: House(condo) Home Access: Level entry     Home Layout: One level     Bathroom Shower/Tub: Tub/shower unit("safety tub")   Bathroom Toilet: Standard     Home Equipment: Environmental consultant - 2 wheels;Cane - single point;Shower seat;Grab bars - tub/shower          Prior Functioning/Environment Level of Independence: Independent        Comments: spouse does assist with washing of dishes and making of bed, pt was using seat in s hower and RW for ambulation since last fall        OT Problem List: Decreased strength;Decreased range of motion;Decreased activity tolerance;Impaired balance (sitting and/or standing);Decreased safety awareness;Decreased knowledge of use of DME or AE;Decreased knowledge of precautions;Pain      OT Treatment/Interventions: Self-care/ADL training;DME and/or AE instruction;Therapeutic activities;Patient/family education;Balance training;Therapeutic exercise    OT Goals(Current goals can be found in the care plan section) Acute Rehab OT Goals Patient Stated Goal: to get better OT Goal Formulation: With patient Time For Goal Achievement: 11/03/18 Potential to Achieve Goals: Good ADL Goals Pt Will Perform Grooming: with modified independence;standing Pt Will Perform Upper Body Dressing: with modified independence;standing Pt Will Perform Lower Body Dressing: with modified independence;sit to/from stand Pt Will Transfer to Toilet: with modified independence;ambulating Pt Will Perform Tub/Shower Transfer: with  modified independence;ambulating  OT Frequency: Min 3X/week   Barriers to D/C: Decreased caregiver support  pt lives with husband but unsure pt's husbands level of functioning        Co-evaluation PT/OT/SLP Co-Evaluation/Treatment: Yes Reason for  Co-Treatment: Complexity of the patient's impairments (multi-system involvement);For patient/therapist safety;To address functional/ADL transfers PT goals addressed during session: Mobility/safety with mobility OT goals addressed during session: ADL's and self-care      AM-PAC OT "6 Clicks" Daily Activity     Outcome Measure Help from another person eating meals?: None Help from another person taking care of personal grooming?: A Little Help from another person toileting, which includes using toliet, bedpan, or urinal?: A Little Help from another person bathing (including washing, rinsing, drying)?: A Little Help from another person to put on and taking off regular upper body clothing?: A Little Help from another person to put on and taking off regular lower body clothing?: A Little 6 Click Score: 19   End of Session Equipment Utilized During Treatment: Gait belt;Rolling walker Nurse Communication: Mobility status  Activity Tolerance: Patient tolerated treatment well;Patient limited by pain Patient left: in bed;with call bell/phone within reach;with bed alarm set  OT Visit Diagnosis: Unsteadiness on feet (R26.81);Other abnormalities of gait and mobility (R26.89);History of falling (Z91.81);Muscle weakness (generalized) (M62.81);Pain Pain - Right/Left: Left Pain - part of body: Leg                Time: 4259-5638 OT Time Calculation (min): 36 min Charges:  OT General Charges $OT Visit: 1 Visit OT Evaluation $OT Eval Moderate Complexity: [place] OTR/L Acute Rehabilitation Services Office: [place] 10/20/2018, 10:37 AM

## 2018-10-20 NOTE — Progress Notes (Signed)
Patient reports intermittent right hip and thigh pain over the last few months.  I have ordered MRI of right femur to evaluate for impending pathologic fracture.  For now, she is WBAT with walker.  May need stabilization with rod for right femur pending MRI results.  Azucena Cecil, MD [facility] [phone] 8:29 AM

## 2018-10-20 NOTE — Progress Notes (Signed)
MRI shows no active reaction or impending pathologic fracture on right subtroch.  We will monitor closely as outpatient.  Continue PT/OT as planned.  WBAT BLE with walker as needed.

## 2018-10-21 LAB — MAGNESIUM: Magnesium: 2.2 mg/dL (ref 1.7–2.4)

## 2018-10-21 LAB — CBC
HCT: 24 % — ABNORMAL LOW (ref 36.0–46.0)
Hemoglobin: 8 g/dL — ABNORMAL LOW (ref 12.0–15.0)
MCH: 30.8 pg (ref 26.0–34.0)
MCHC: 33.3 g/dL (ref 30.0–36.0)
MCV: 92.3 fL (ref 80.0–100.0)
Platelets: 147 10*3/uL — ABNORMAL LOW (ref 150–400)
RBC: 2.6 MIL/uL — ABNORMAL LOW (ref 3.87–5.11)
RDW: 12.4 % (ref 11.5–15.5)
WBC: 8.1 10*3/uL (ref 4.0–10.5)
nRBC: 0 % (ref 0.0–0.2)

## 2018-10-21 LAB — BASIC METABOLIC PANEL
Anion gap: 8 (ref 5–15)
BUN: 13 mg/dL (ref 8–23)
CO2: 23 mmol/L (ref 22–32)
Calcium: 7.9 mg/dL — ABNORMAL LOW (ref 8.9–10.3)
Chloride: 109 mmol/L (ref 98–111)
Creatinine, Ser: 0.64 mg/dL (ref 0.44–1.00)
GFR calc Af Amer: >60 mL/min (ref >60)
GFR calc non Af Amer: >60 mL/min (ref >60)
Glucose, Bld: 120 mg/dL — ABNORMAL HIGH (ref 70–99)
Potassium: 3 mmol/L — ABNORMAL LOW (ref 3.5–5.1)
Sodium: 140 mmol/L (ref 135–145)

## 2018-10-21 MED ORDER — POLYETHYLENE GLYCOL 3350 17 G PO PACK: 17.0000 g | PACK | Freq: Every day | ORAL | 0 refills | Status: DC | PRN

## 2018-10-21 MED ORDER — SENNOSIDES-DOCUSATE SODIUM 8.6-50 MG PO TABS: 2.0000 | ORAL_TABLET | Freq: Every evening | ORAL | Status: AC | PRN

## 2018-10-21 MED ORDER — POTASSIUM CHLORIDE 10 MEQ/100ML IV SOLN
10.0000 meq | INTRAVENOUS | Status: AC
  Administered 2018-10-21 (×4): 10 meq via INTRAVENOUS
  Filled 2018-10-21 (×4): qty 100

## 2018-10-21 MED FILL — Fentanyl Citrate Preservative Free (PF) Inj 100 MCG/2ML: INTRAMUSCULAR | Qty: 2 | Status: AC

## 2018-10-21 NOTE — Care Management Important Message (Signed)
Important Message  Patient Details  Name: Becky Montoya MRN: [id number] Date of Birth: November 23, 1926   Medicare Important Message Given:  Yes     Memory Argue 10/21/2018, 12:51 PM   PATIENT HAS SIGNED IM

## 2018-10-21 NOTE — Plan of Care (Signed)

## 2018-10-21 NOTE — Progress Notes (Signed)
Physical Therapy Treatment Patient Details Name: Becky Montoya MRN: [id number] DOB: 10/24/1926 Today's Date: 10/21/2018    History of Present Illness Becky Montoya is a 85 y.o. female with medical history significant of well-managed hypertension, hypothyroid disease, hyperlipidemia. Admitted s/p fall sustaining L hip fracture, pt underwent L IM Nail. Pt now also with intermittent R hip/groin pain and is pending MRI for possible pathologic fracture. Per Dr. Phoebe Sharps note pt currently WBAT.    PT Comments    Pt progressing well towards all goals. Pt emotional stating "I"m so grateful. I didn't think that at my age I would be able to bounce back." Pt began ambulation today and is demonstrating excellent rehab progress. Con't to recommend CIR upon d/c.    Follow Up Recommendations  CIR     Equipment Recommendations  None recommended by PT    Recommendations for Other Services Rehab consult     Precautions / Restrictions Precautions Precautions: Fall Restrictions Weight Bearing Restrictions: Yes RLE Weight Bearing: Weight bearing as tolerated LLE Weight Bearing: Weight bearing as tolerated    Mobility  Bed Mobility Overal bed mobility: Needs Assistance Bed Mobility: Supine to Sit     Supine to sit: Mod assist     General bed mobility comments: modA for L LE management of EOB and modA for trunk elevation , pt able to use hands and scoot to EOB to get feet on floor  Transfers Overall transfer level: Needs assistance Equipment used: Rolling walker (2 wheeled) Transfers: Sit to/from Stand Sit to Stand: Min assist         General transfer comment: verbal cues for hand placement  Ambulation/Gait Ambulation/Gait assistance: Min assist Gait Distance (Feet): 20 Feet Assistive device: Rolling walker (2 wheeled) Gait Pattern/deviations: Step-to pattern;Decreased stride length Gait velocity: slow Gait velocity interpretation: <1.8 ft/sec, indicate of risk for recurrent  falls General Gait Details: pt very guard and cautious, limited L LE WBing tolerance minimizing R foot clearance but with encouragement pt did attempt to increase bilat UE WBing to improved R LE clearance   Stairs             Wheelchair Mobility    Modified Rankin (Stroke Patients Only)       Balance Overall balance assessment: Needs assistance Sitting-balance support: Feet supported;No upper extremity supported Sitting balance-Leahy Scale: Good     Standing balance support: Bilateral upper extremity supported Standing balance-Leahy Scale: Poor Standing balance comment: dependent on RW                            Cognition Arousal/Alertness: Awake/alert Behavior During Therapy: WFL for tasks assessed/performed Overall Cognitive Status: Within Functional Limits for tasks assessed                                 General Comments: pt became tearful due to "I'm so grateful at my age that I've been able to bounce back"      Exercises General Exercises - Lower Extremity Ankle Circles/Pumps: AROM;Both;10 reps;Supine Quad Sets: AROM;Both;10 reps;Supine Gluteal Sets: AROM;Both;10 reps;Supine Long Arc Quad: AROM;Left;10 reps;Seated    General Comments General comments (skin integrity, edema, etc.): no drainage from incision      Pertinent Vitals/Pain Pain Assessment: 0-10 Pain Score: 5  Pain Location: L LE with walking, 1/10 when in bed Pain Descriptors / Indicators: Discomfort Pain Intervention(s): Monitored during session  Home Living                      Prior Function            PT Goals (current goals can now be found in the care plan section) Progress towards PT goals: Progressing toward goals    Frequency    Min 5X/week      PT Plan Current plan remains appropriate    Co-evaluation              AM-PAC PT "6 Clicks" Mobility   Outcome Measure  Help needed turning from your back to your side while in  a flat bed without using bedrails?: A Lot Help needed moving from lying on your back to sitting on the side of a flat bed without using bedrails?: A Lot Help needed moving to and from a bed to a chair (including a wheelchair)?: A Little Help needed standing up from a chair using your arms (e.g., wheelchair or bedside chair)?: A Little Help needed to walk in hospital room?: A Little Help needed climbing 3-5 steps with a railing? : A Lot 6 Click Score: 15    End of Session Equipment Utilized During Treatment: Gait belt Activity Tolerance: Patient tolerated treatment well Patient left: with call bell/phone within reach;in chair;with chair alarm set Nurse Communication: Mobility status PT Visit Diagnosis: Unsteadiness on feet (R26.81);Difficulty in walking, not elsewhere classified (R26.2)     Time: 9528-4132 PT Time Calculation (min) (ACUTE ONLY): 24 min  Charges:  $Gait Training: 8-22 mins $Therapeutic Exercise: 8-22 mins                     Kittie Plater, PT, DPT Acute Rehabilitation Services Pager #: [phone] Office #: [phone]    Berline Lopes 10/21/2018, 10:50 AM

## 2018-10-21 NOTE — Progress Notes (Signed)
Inpatient Rehabilitation-Admissions Coordinator   [facility] has confirmed support at DC and pt would like to pursue CIR at this time. AC will begin insurance authorization process for possible admit.   Please call if questions.   Jhonnie Garner, OTR/L  Rehab Admissions Coordinator  [phone] 10/21/2018 9:40 AM

## 2018-10-21 NOTE — Progress Notes (Signed)
Subjective: 2 Days Post-Op Procedure(s) (LRB): INTRAMEDULLARY (IM) NAIL INTERTROCHANTRIC (Left) Patient reports pain as mild.  Doing well this am.   Objective: Vital signs in last 24 hours: Temp:  [97.4 F (36.3 C)-98.9 F (37.2 C)] 98.9 F (37.2 C) (07/14 0407) Pulse Rate:  [92-99] 92 (07/14 0407) Resp:  [15-16] 16 (07/14 0407) BP: (124-154)/(46-64) 154/64 (07/14 0407) SpO2:  [94 %-99 %] 95 % (07/14 0407)  Intake/Output from previous day: 07/13 0701 - 07/14 0700 In: 480 [P.O.:480] Out: -  Intake/Output this shift: No intake/output data recorded.  Recent Labs    10/18/18 1645 10/20/18 0459 10/21/18 0318  HGB 13.6 9.1* 8.0*   Recent Labs    10/20/18 0459 10/21/18 0318  WBC 10.5 8.1  RBC 2.95* 2.60*  HCT 27.5* 24.0*  PLT 183 147*   Recent Labs    10/20/18 0459 10/21/18 0318  NA 135 140  K 3.6 3.0*  CL 103 109  CO2 23 23  BUN 14 13  CREATININE 0.74 0.64  GLUCOSE 114* 120*  CALCIUM 7.7* 7.9*   No results for input(s): LABPT, INR in the last 72 hours.  Neurologically intact Neurovascular intact Sensation intact distally Intact pulses distally Dorsiflexion/Plantar flexion intact Incision: dressing C/D/I No cellulitis present Compartment soft   Assessment/Plan: 2 Days Post-Op Procedure(s) (LRB): INTRAMEDULLARY (IM) NAIL INTERTROCHANTRIC (Left) Up with therapy WBAT BLE with walker as needed ABLA- mild and stable Theprolia will need to be stopped for at least 3 months postoperativelyand we will allow her PCP to discuss with patient to determine appropriate treatment alternatives. Lovenox 40 mg daily x 14 days Right femur MRI is not concerning for impending fracture- we will follow closely in outpatient setting F/u with Dr. Erlinda Hong 2 weeks post-op for suture removal     Aundra Dubin 10/21/2018, 7:47 AM

## 2018-10-21 NOTE — Progress Notes (Signed)
Inpatient Rehabilitation-Admissions Coordinator   [facility] has denied her request for CIR. AC has notified the pt and Dr. Reesa Chew of the denial. The pt is open to SNF placement. AC will contact CM/SW regarding need for new placement. I will sign off.   Please call if questions.   Jhonnie Garner, OTR/L  Rehab Admissions Coordinator  [phone] 10/21/2018 2:28 PM

## 2018-10-21 NOTE — Discharge Summary (Signed)
Physician Discharge Summary  BROOKLYN ALFREDO ZDG:[id number] DOB: 1926/04/12 DOA: 10/18/2018  PCP: Harlan Stains, MD  Admit date: 10/18/2018 Discharge date: 10/21/2018  Admitted From: Home Disposition: CIR  Recommendations for Outpatient Follow-up:  1. Follow up with PCP in 1-2 weeks 2. Check BMP/CBC in next 3-5 days. 3. Postop DVT prophylaxis per orthopedic team.  Lovenox for 2 weeks, in the meantime follow-up appointment with Dr Erlinda Hong from orthopedic for suture removal. 4. Prolia to be held for at least 3 months  Discharge Condition: Stable CODE STATUS: Full code Diet recommendation: 2 g salt  Brief/Interim Summary: 85 year old with history of essential hypertension, hypothyroidism and hyperlipidemia had a fall at home and suffered from left hip fracture.  She underwent surgical intervention on 10/19/2018.  She tolerated the procedure well.  There was also concern for possible right hip impending pathologic fracture therefore MRI was done which was reviewed by orthopedic and recommended outpatient monitoring.  Otherwise weightbearing as tolerated with walker.  Physical therapy recommended CIR therefore arrangements made.   Discharge Diagnoses:  Principal Problem:   Closed left subtrochanteric femur fracture, initial encounter [facility]) Active Problems:   Hypothyroidism   Hypertension   Hyperlipidemia   Closed left subtrochanteric femur fracture ([place])   Fall at home, initial encounter  Left hip fracture requiring subtrochanteric intramedullary implant Right hip pain with impending pathologic fracture -Status post repair 7/12 -DVT prophylaxis per orthopedic team-awaiting their exact recommendations - Pain control, bowel regimen -PT/OT recommending CIR, arrangements to be made by social worker and physical therapy team. -Incentive spirometer -  MRI of the right hip is negative for concerns for any acute impending fracture, this will be monitored outpatient by orthopedic team. -Prolia to  be held per orthopedic for at least 3 months  Acute blood loss anemia -Baseline hemoglobin appears to be close to 13 at the time of admission now it is almost down to 8.  No obvious evidence of bleeding.  Probably combination of hydration and slight surgical blood loss.  Continue to monitor this.  No need for transfusion at this time.  Patient is not symptomatic.  Hypokalemia -Repletion ordered prior to her discharge.  Essential hypertension - Hydrochlorothiazide, losartan  Hypothyroidism - Synthroid 50 mcg daily  GERD -PPI as needed  Hyperlipidemia -Statin.  Consultations:  Orthopedic  Subjective: She does not have any complaints today.  Overall doing well.  Denies any signs of bleeding.  Hip pain is expected with therapy otherwise well controlled.  Discharge Exam: Vitals:   10/21/18 0407 10/21/18 0820  BP: (!) 154/64 (!) 145/52  Pulse: 92 89  Resp: 16 16  Temp: 98.9 F (37.2 C) 98.2 F (36.8 C)  SpO2: 95% 98%   Vitals:   10/20/18 1619 10/20/18 1951 10/21/18 0407 10/21/18 0820  BP: (!) 132/46 (!) 124/51 (!) 154/64 (!) 145/52  Pulse: 99 99 92 89  Resp: _0 Temp: 98.4 F (36.9 C) (!) 97.4 F (36.3 C) 98.9 F (37.2 C) 98.2 F (36.8 C)  TempSrc: Oral Oral Oral Oral  SpO2: 94% 99% 95% 98%  Weight:      Height:        General: Pt is alert, awake, not in acute distress Cardiovascular: RRR, S1/S2 +, no rubs, no gallops Respiratory: CTA bilaterally, no wheezing, no rhonchi Abdominal: Soft, NT, ND, bowel sounds + Extremities: no edema, no cyanosis Surgical dressing of her left hip noted without any evidence of bleeding or infection. Discharge Instructions  Discharge Instructions  Call MD for:  persistant dizziness or light-headedness   Complete by: As directed    Call MD for:  severe uncontrolled pain   Complete by: As directed    Diet - low sodium heart healthy   Complete by: As directed    Increase activity slowly   Complete by: As  directed    Weight bearing as tolerated   Complete by: As directed      Allergies as of 10/21/2018      Reactions   Wellbutrin [bupropion] Other (See Comments)   Nervousness      Medication List    TAKE these medications   aspirin EC 81 MG tablet Take 81 mg by mouth every morning.   denosumab 60 MG/ML Soln injection Commonly known as: PROLIA Inject 60 mg into the skin every 6 (six) months. Administer in upper arm, thigh, or abdomen   HYDROcodone-acetaminophen 7.5-325 MG tablet Commonly known as: Norco Take 1-2 tablets by mouth 3 (three) times daily as needed for moderate pain.   levothyroxine 50 MCG tablet Commonly known as: SYNTHROID Take 50 mcg by mouth every morning.   losartan-hydrochlorothiazide 100-12.5 MG tablet Commonly known as: HYZAAR Take 1 tablet by mouth every morning.   Lutein 20 Caps Take 1 capsule by mouth every morning.   Magnesium 250 MG Tabs Take 250 mg by mouth daily.   OMEGA 3 PO Take 1 capsule by mouth daily.   polyethylene glycol 17 g packet Commonly known as: MIRALAX / GLYCOLAX Take 17 g by mouth daily as needed for mild constipation.   rivaroxaban 10 MG Tabs tablet Commonly known as: XARELTO Take 1 tablet (10 mg total) by mouth daily.   senna-docusate 8.6-50 MG tablet Commonly known as: Senokot-S Take 2 tablets by mouth at bedtime as needed for mild constipation or moderate constipation.   simvastatin 20 MG tablet Commonly known as: ZOCOR Take 20 mg by mouth every evening.   vitamin E 400 UNIT capsule Take 400 Units by mouth every evening.            Discharge Care Instructions  (From admission, onward)         Start     Ordered   10/19/18 0000  Weight bearing as tolerated     10/19/18 3244         Follow-up Information    Leandrew Koyanagi, MD In 2 weeks.   Specialty: Orthopedic Surgery Why: For suture removal, For wound re-check Contact information: [place]  [id number] [phone]        Harlan Stains, MD. Schedule an appointment as soon as possible for a visit in 1 week(s).   Specialty: Family Medicine Contact information: [address] [place] [address] [phone]          Allergies  Allergen Reactions  . Wellbutrin [Bupropion] Other (See Comments)    Nervousness    You were cared for by a hospitalist during your hospital stay. If you have any questions about your discharge medications or the care you received while you were in the hospital after you are discharged, you can call the unit and asked to speak with the hospitalist on call if the hospitalist that took care of you is not available. Once you are discharged, your primary care physician will handle any further medical issues. Please note that no refills for any discharge medications will be authorized once you are discharged, as it is imperative that you return to your  primary care physician (or establish a relationship with a primary care physician if you do not have one) for your aftercare needs so that they can reassess your need for medications and monitor your lab values.   Procedures/Studies: Pelvis Portable  Result Date: 10/19/2018 CLINICAL DATA:  Status post ORIF of a proximal left femur fracture. EXAM: PORTABLE PELVIS 1-2 VIEWS COMPARISON:  10/18/2018 FINDINGS: Single image shows a long intramedullary rod supporting 2 screws. Fracture of the proximal femur, which is below the intertrochanteric line, has been significantly reduced. Minimal residual displacement is noted, the proximal fracture component displaced 7-8 mm laterally. IMPRESSION: Status post ORIF of a proximal left femur fracture. Fracture has been significantly reduced with only minimal residual displacement. Electronically Signed   By: Lajean Manes M.D.   On: 10/19/2018 10:01   Mr Frmur Right Wo Contrast  Result Date: 10/20/2018 CLINICAL DATA:  Right subtrochanteric femur stress  fracture. EXAM: MRI OF THE RIGHT FEMUR WITHOUT CONTRAST TECHNIQUE: Multiplanar, multisequence MR imaging of the right femur was performed. No intravenous contrast was administered. COMPARISON:  Pelvic x-rays dated October 19, 2018. FINDINGS: Bones/Joint/Cartilage There is focal cortical thickening along the lateral aspect of the proximal subtrochanteric right femur without adjacent marrow edema or periosteal reaction. Unchanged left subtrochanteric femur fracture status post intramedullary rod placement with associated susceptibility artifact. No dislocation. No focal bone lesion. No significant joint effusion. Muscles and Tendons Mild edema in the left quadriceps and hamstring muscles, as well as the bilateral adductor muscles. No muscle atrophy. Soft tissue Small amount of fluid in the right greater trochanteric bursa. No fluid collection or hematoma. No soft tissue mass. IMPRESSION: 1. Focal cortical thickening along the lateral aspect of the proximal subtrochanteric right femur without adjacent marrow edema or periosteal reaction, consistent with healed stress fracture. 2. Unchanged acute left subtrochanteric femur fracture status post ORIF. Electronically Signed   By: Titus Dubin M.D.   On: 10/20/2018 12:49   Chest Portable 1 View  Result Date: 10/18/2018 CLINICAL DATA:  Preoperative study.  Known hip fracture. EXAM: PORTABLE CHEST 1 VIEW COMPARISON:  None. FINDINGS: The heart size and mediastinal contours are within normal limits. Both lungs are clear. The visualized skeletal structures are unremarkable. IMPRESSION: No active disease. Electronically Signed   By: Dorise Bullion III M.D   On: 10/18/2018 19:06   Dg C-arm 1-60 Min  Result Date: 10/19/2018 CLINICAL DATA:  Status post ORIF of a left proximal femur fracture. EXAM: LEFT FEMUR 2 VIEWS; DG C-ARM 61-120 MIN COMPARISON:  None. FINDINGS: Portable images demonstrate placement of an intramedullary rod supporting compression screws, reducing the  fracture into near anatomic alignment. Orthopedic hardware appears well seated. There is no new fracture or evidence of an operative complication. IMPRESSION: Well aligned proximal left femur fracture following ORIF. Electronically Signed   By: Lajean Manes M.D.   On: 10/19/2018 10:00   Dg Hip Unilat With Pelvis 2-3 Views Left  Result Date: 10/18/2018 CLINICAL DATA:  Left hip pain post fall. EXAM: DG HIP (WITH OR WITHOUT PELVIS) 2-3V LEFT COMPARISON:  None. FINDINGS: Transverse fracture of the left proximal femur, just distal to the trochanteric region. The left hip is located with varus rotation of the proximal fracture fragment, and superior displacement of the distal fracture fragment. IMPRESSION: Displaced transverse fracture of the left proximal femur. Electronically Signed   By: Fidela Salisbury M.D.   On: 10/18/2018 17:26   Dg Femur Min 2 Views Left  Result Date: 10/19/2018 CLINICAL DATA:  Status  post ORIF of a left proximal femur fracture. EXAM: LEFT FEMUR 2 VIEWS; DG C-ARM 61-120 MIN COMPARISON:  None. FINDINGS: Portable images demonstrate placement of an intramedullary rod supporting compression screws, reducing the fracture into near anatomic alignment. Orthopedic hardware appears well seated. There is no new fracture or evidence of an operative complication. IMPRESSION: Well aligned proximal left femur fracture following ORIF. Electronically Signed   By: Lajean Manes M.D.   On: 10/19/2018 10:00   Dg Femur Min 2 Views Left  Result Date: 10/18/2018 CLINICAL DATA:  Recent fall with hip pain, initial encounter EXAM: LEFT FEMUR 2 VIEWS COMPARISON:  None. FINDINGS: There is a fracture through the proximal shaft of the left femur just below the intratrochanteric region with medial displacement of the distal femoral shaft with respect to the proximal femur. Considerable angulation is noted as well. No other fracture is seen. IMPRESSION: Fracture through the proximal left femur just below the  intratrochanteric region with angulation and displacement of the distal femoral shaft. Electronically Signed   By: Inez Catalina M.D.   On: 10/18/2018 17:25      The results of significant diagnostics from this hospitalization (including imaging, microbiology, ancillary and laboratory) are listed below for reference.     Microbiology: Recent Results (from the past 240 hour(s))  SARS Coronavirus 2 (CEPHEID - Performed in [place] hospital lab), Hosp Order     Status: None   Collection Time: 10/18/18  5:12 PM   Specimen: Nasopharyngeal Swab  Result Value Ref Range Status   SARS Coronavirus 2 NEGATIVE NEGATIVE Final    Comment: (NOTE) If result is NEGATIVE SARS-CoV-2 target nucleic acids are NOT DETECTED. The SARS-CoV-2 RNA is generally detectable in upper and lower  respiratory specimens during the acute phase of infection. The lowest  concentration of SARS-CoV-2 viral copies this assay can detect is 250  copies / mL. A negative result does not preclude SARS-CoV-2 infection  and should not be used as the sole basis for treatment or other  patient management decisions.  A negative result may occur with  improper specimen collection / handling, submission of specimen other  than nasopharyngeal swab, presence of viral mutation(s) within the  areas targeted by this assay, and inadequate number of viral copies  (<250 copies / mL). A negative result must be combined with clinical  observations, patient history, and epidemiological information. If result is POSITIVE SARS-CoV-2 target nucleic acids are DETECTED. The SARS-CoV-2 RNA is generally detectable in upper and lower  respiratory specimens dur ing the acute phase of infection.  Positive  results are indicative of active infection with SARS-CoV-2.  Clinical  correlation with patient history and other diagnostic information is  necessary to determine patient infection status.  Positive results do  not rule out bacterial infection or  co-infection with other viruses. If result is PRESUMPTIVE POSTIVE SARS-CoV-2 nucleic acids MAY BE PRESENT.   A presumptive positive result was obtained on the submitted specimen  and confirmed on repeat testing.  While 2019 novel coronavirus  (SARS-CoV-2) nucleic acids may be present in the submitted sample  additional confirmatory testing may be necessary for epidemiological  and / or clinical management purposes  to differentiate between  SARS-CoV-2 and other Sarbecovirus currently known to infect humans.  If clinically indicated additional testing with an alternate test  methodology [phone]) is advised. The SARS-CoV-2 RNA is generally  detectable in upper and lower respiratory sp ecimens during the acute  phase of infection. The expected result is Negative.  Fact Sheet for Patients:  StrictlyIdeas.no Fact Sheet for Healthcare Providers: BankingDealers.co.za This test is not yet approved or cleared by the [place] FDA and has been authorized for detection and/or diagnosis of SARS-CoV-2 by FDA under an Emergency Use Authorization (EUA).  This EUA will remain in effect (meaning this test can be used) for the duration of the COVID-19 declaration under Section 564(b)(1) of the Act, 21 U.S.C. section 360bbb-3(b)(1), unless the authorization is terminated or revoked sooner. Performed at [facility], [place] [address], [place], [place] [address]   Surgical pcr screen     Status: None   Collection Time: 10/19/18  1:28 AM   Specimen: Nasal Mucosa; Nasal Swab  Result Value Ref Range Status   MRSA, PCR NEGATIVE NEGATIVE Final   Staphylococcus aureus NEGATIVE NEGATIVE Final    Comment: (NOTE) The Xpert SA Assay (FDA approved for NASAL specimens in patients 23 years of age and older), is one component of a comprehensive surveillance program. It is not intended to diagnose infection nor to guide or monitor  treatment. Performed at [facility], [place] [address], [place], [place] [address]      Labs: BNP (last 3 results) No results for input(s): BNP in the last 8760 hours. Basic Metabolic Panel: Recent Labs  Lab 10/18/18 1735 10/19/18 0517 10/20/18 0459 10/21/18 0318  NA 139 137 135 140  K 3.6 3.5 3.6 3.0*  CL 101 104 103 109  CO2 _0 GLUCOSE 136* 145* 114* 120*  BUN _1 CREATININE 0.64 0.73 0.74 0.64  CALCIUM 9.2 8.7* 7.7* 7.9*  MG  --   --  2.2 2.2   Liver Function Tests: No results for input(s): AST, ALT, ALKPHOS, BILITOT, PROT, ALBUMIN in the last 168 hours. No results for input(s): LIPASE, AMYLASE in the last 168 hours. No results for input(s): AMMONIA in the last 168 hours. CBC: Recent Labs  Lab 10/18/18 1645 10/20/18 0459 10/21/18 0318  WBC 6.8 10.5 8.1  NEUTROABS 4.4  --   --   HGB 13.6 9.1* 8.0*  HCT 42.6 27.5* 24.0*  MCV 98.6 93.2 92.3  PLT 180 183 147*   Cardiac Enzymes: No results for input(s): CKTOTAL, CKMB, CKMBINDEX, TROPONINI in the last 168 hours. BNP: Invalid input(s): POCBNP CBG: No results for input(s): GLUCAP in the last 168 hours. D-Dimer No results for input(s): DDIMER in the last 72 hours. Hgb A1c No results for input(s): HGBA1C in the last 72 hours. Lipid Profile No results for input(s): CHOL, HDL, LDLCALC, TRIG, CHOLHDL, LDLDIRECT in the last 72 hours. Thyroid function studies No results for input(s): TSH, T4TOTAL, T3FREE, THYROIDAB in the last 72 hours.  Invalid input(s): FREET3 Anemia work up No results for input(s): VITAMINB12, FOLATE, FERRITIN, TIBC, IRON, RETICCTPCT in the last 72 hours. Urinalysis No results found for: COLORURINE, APPEARANCEUR, [place], [place], GLUCOSEU, [place], [place], KETONESUR, PROTEINUR, UROBILINOGEN, NITRITE, LEUKOCYTESUR Sepsis Labs Invalid input(s): PROCALCITONIN,  WBC,  LACTICIDVEN Microbiology Recent Results (from the past 240 hour(s))  SARS Coronavirus 2 (CEPHEID -  Performed in [place] hospital lab), Hosp Order     Status: None   Collection Time: 10/18/18  5:12 PM   Specimen: Nasopharyngeal Swab  Result Value Ref Range Status   SARS Coronavirus 2 NEGATIVE NEGATIVE Final    Comment: (NOTE) If result is NEGATIVE SARS-CoV-2 target nucleic acids are NOT DETECTED. The SARS-CoV-2 RNA is generally detectable in upper and lower  respiratory specimens during the acute  phase of infection. The lowest  concentration of SARS-CoV-2 viral copies this assay can detect is 250  copies / mL. A negative result does not preclude SARS-CoV-2 infection  and should not be used as the sole basis for treatment or other  patient management decisions.  A negative result may occur with  improper specimen collection / handling, submission of specimen other  than nasopharyngeal swab, presence of viral mutation(s) within the  areas targeted by this assay, and inadequate number of viral copies  (<250 copies / mL). A negative result must be combined with clinical  observations, patient history, and epidemiological information. If result is POSITIVE SARS-CoV-2 target nucleic acids are DETECTED. The SARS-CoV-2 RNA is generally detectable in upper and lower  respiratory specimens dur ing the acute phase of infection.  Positive  results are indicative of active infection with SARS-CoV-2.  Clinical  correlation with patient history and other diagnostic information is  necessary to determine patient infection status.  Positive results do  not rule out bacterial infection or co-infection with other viruses. If result is PRESUMPTIVE POSTIVE SARS-CoV-2 nucleic acids MAY BE PRESENT.   A presumptive positive result was obtained on the submitted specimen  and confirmed on repeat testing.  While 2019 novel coronavirus  (SARS-CoV-2) nucleic acids may be present in the submitted sample  additional confirmatory testing may be necessary for epidemiological  and / or clinical management  purposes  to differentiate between  SARS-CoV-2 and other Sarbecovirus currently known to infect humans.  If clinically indicated additional testing with an alternate test  methodology [phone]) is advised. The SARS-CoV-2 RNA is generally  detectable in upper and lower respiratory sp ecimens during the acute  phase of infection. The expected result is Negative. Fact Sheet for Patients:  StrictlyIdeas.no Fact Sheet for Healthcare Providers: BankingDealers.co.za This test is not yet approved or cleared by the [place] FDA and has been authorized for detection and/or diagnosis of SARS-CoV-2 by FDA under an Emergency Use Authorization (EUA).  This EUA will remain in effect (meaning this test can be used) for the duration of the COVID-19 declaration under Section 564(b)(1) of the Act, 21 U.S.C. section 360bbb-3(b)(1), unless the authorization is terminated or revoked sooner. Performed at [facility], [place] [address], [place], [place] [address]   Surgical pcr screen     Status: None   Collection Time: 10/19/18  1:28 AM   Specimen: Nasal Mucosa; Nasal Swab  Result Value Ref Range Status   MRSA, PCR NEGATIVE NEGATIVE Final   Staphylococcus aureus NEGATIVE NEGATIVE Final    Comment: (NOTE) The Xpert SA Assay (FDA approved for NASAL specimens in patients 75 years of age and older), is one component of a comprehensive surveillance program. It is not intended to diagnose infection nor to guide or monitor treatment. Performed at [facility], [place] [address], [place], [place] [address]      Time coordinating discharge:  I have spent 35 minutes face to face with the patient and on the ward discussing the patients care, assessment, plan and disposition with other care givers. >50% of the time was devoted counseling the patient about the risks and benefits of treatment/Discharge disposition and coordinating care.    SIGNED:   Damita Lack, MD  [facility] 10/21/2018, 10:22 AM   If 7PM-7AM, please contact night-coverage [email or web address]

## 2018-10-21 NOTE — Plan of Care (Signed)

## 2018-10-22 LAB — BASIC METABOLIC PANEL
Anion gap: 5 (ref 5–15)
BUN: 9 mg/dL (ref 8–23)
CO2: 25 mmol/L (ref 22–32)
Calcium: 7.8 mg/dL — ABNORMAL LOW (ref 8.9–10.3)
Chloride: 109 mmol/L (ref 98–111)
Creatinine, Ser: 0.59 mg/dL (ref 0.44–1.00)
GFR calc Af Amer: >60 mL/min (ref >60)
GFR calc non Af Amer: >60 mL/min (ref >60)
Glucose, Bld: 112 mg/dL — ABNORMAL HIGH (ref 70–99)
Potassium: 3.7 mmol/L (ref 3.5–5.1)
Sodium: 139 mmol/L (ref 135–145)

## 2018-10-22 LAB — CBC
HCT: 22.1 % — ABNORMAL LOW (ref 36.0–46.0)
Hemoglobin: 7.4 g/dL — ABNORMAL LOW (ref 12.0–15.0)
MCH: 31.6 pg (ref 26.0–34.0)
MCHC: 33.5 g/dL (ref 30.0–36.0)
MCV: 94.4 fL (ref 80.0–100.0)
Platelets: 180 10*3/uL (ref 150–400)
RBC: 2.34 MIL/uL — ABNORMAL LOW (ref 3.87–5.11)
RDW: 12.6 % (ref 11.5–15.5)
WBC: 7 10*3/uL (ref 4.0–10.5)
nRBC: 0 % (ref 0.0–0.2)

## 2018-10-22 LAB — PREPARE RBC (CROSSMATCH)

## 2018-10-22 LAB — OCCULT BLOOD X 1 CARD TO LAB, STOOL: Fecal Occult Bld: NEGATIVE

## 2018-10-22 LAB — ABO/RH: ABO/RH(D): A NEG

## 2018-10-22 LAB — MAGNESIUM: Magnesium: 1.9 mg/dL (ref 1.7–2.4)

## 2018-10-22 MED ORDER — SODIUM CHLORIDE 0.9% IV SOLUTION: Freq: Once | INTRAVENOUS | Status: DC

## 2018-10-22 MED ORDER — FERROUS SULFATE 325 (65 FE) MG PO TABS
325.0000 mg | ORAL_TABLET | Freq: Two times a day (BID) | ORAL | Status: DC
  Administered 2018-10-22 – 2018-10-24 (×4): 325 mg via ORAL
  Filled 2018-10-22 (×4): qty 1

## 2018-10-22 MED ORDER — ENOXAPARIN SODIUM 40 MG/0.4ML ~~LOC~~ SOLN
40.0000 mg | Freq: Every day | SUBCUTANEOUS | Status: DC
  Administered 2018-10-23 – 2018-10-24 (×2): 40 mg via SUBCUTANEOUS
  Filled 2018-10-22 (×2): qty 0.4

## 2018-10-22 MED ORDER — FERROUS SULFATE 325 (65 FE) MG PO TABS: 325.0000 mg | ORAL_TABLET | Freq: Two times a day (BID) | ORAL | 0 refills | Status: DC

## 2018-10-22 NOTE — Progress Notes (Signed)
PROGRESS NOTE    Becky Montoya  ZOX:[id number] DOB: February 28, 1927 DOA: 10/18/2018 PCP: Harlan Stains, MD   Brief Narrative:  86 ?essential hypertension, hypothyroidism and hyperlipidemia had a fall at home and suffered from left hip fracture.  She underwent surgical intervention on 10/19/2018.  She tolerated the procedure well. Had MRI contralateral hip which will be monitored as OP   Assessment & Plan:   Principal Problem:   Closed left subtrochanteric femur fracture, initial encounter [facility]) Active Problems:   Hypothyroidism   Hypertension   Hyperlipidemia   Closed left subtrochanteric femur fracture ([place])   Fall at home, initial encounter  Left hip fracture requiring subtrochanteric intramedullary implant Right hip pain with impending pathologic fracture -Status post repair 7/12 -DVT prophylaxis per orthopedic team-lovenox or oral AC?Dr. Erlinda Hong to commnet - Pain control, bowel regimen - MRI of the right hip ordered by orthopedic--no impending # so OP close f/u -WBAT BLE + walker per ortho on d/c -Prolia to be held per orthopedic for at least 3 months  Anemia of acute blood loss from surgery component of dilutional anemia -multiple soft stool today--will gauic -transfuse if hemoglobin below 7  Essential hypertension, mod control - Hydrochlorothiazide, losartan  Hypothyroidism - Synthroid 50 mcg daily, TSH in 3 weeks  GERD -PPI as needed  Hyperlipidemia -Statin.  DVT prophylaxis: Xarelto at present Code Status: Full code Family Communication: called daughte ron cell, couldn't leave VM Disposition Plan: Pending CSW SNF placement  Consultants:   Orthopedic, Dr Erlinda Hong  Procedures:   ORIF 7/12  Antimicrobials:   Perioperative cefazolin   Subjective: Mild arm pains fro arthritis No cp Has not been OOB today eating fair only. Soft brown stool earlier today    Objective: Vitals:   10/21/18 1956 10/22/18 0434 10/22/18 0859 10/22/18 1413  BP: (!) 161/60 (!)  141/68 (!) 143/61 (!) 141/64  Pulse: (!) 101 95 (!) 113 96  Resp: _0 Temp: 97.8 F (36.6 C) 98.7 F (37.1 C) 98.4 F (36.9 C) 98.6 F (37 C)  TempSrc: Oral Oral Oral Oral  SpO2: 99% 97% 100% 100%  Weight:      Height:        Intake/Output Summary (Last 24 hours) at 10/22/2018 1625 Last data filed at 10/22/2018 1452 Gross per 24 hour  Intake 720 ml  Output -  Net 720 ml   Filed Weights   10/18/18 1932  Weight: 59 kg    Examination:  eomi ncat, smiling AOx4 Some pallor, no ict abd soft nt nd no rebound no guard s1 s 2no m/r/g Neuro intact no focal deficit Bandages on leg left undisturbed  Data Reviewed:   CBC: Recent Labs  Lab 10/18/18 1645 10/20/18 0459 10/21/18 0318 10/22/18 0400  WBC 6.8 10.5 8.1 7.0  NEUTROABS 4.4  --   --   --   HGB 13.6 9.1* 8.0* 7.4*  HCT 42.6 27.5* 24.0* 22.1*  MCV 98.6 93.2 92.3 94.4  PLT 180 183 147* 811   Basic Metabolic Panel: Recent Labs  Lab 10/18/18 1735 10/19/18 0517 10/20/18 0459 10/21/18 0318 10/22/18 0400  NA 139 137 135 140 139  K 3.6 3.5 3.6 3.0* 3.7  CL 101 104 103 109 109  CO2 _1 GLUCOSE 136* 145* 114* 120* 112*  BUN _2 CREATININE 0.64 0.73 0.74 0.64 0.59  CALCIUM 9.2 8.7* 7.7* 7.9* 7.8*  MG  --   --  2.2 2.2 1.9  GFR: Estimated Creatinine Clearance: 38.7 mL/min (by C-G formula based on SCr of 0.59 mg/dL). Liver Function Tests: No results for input(s): AST, ALT, ALKPHOS, BILITOT, PROT, ALBUMIN in the last 168 hours. No results for input(s): LIPASE, AMYLASE in the last 168 hours. No results for input(s): AMMONIA in the last 168 hours. Coagulation Profile: No results for input(s): INR, PROTIME in the last 168 hours. Cardiac Enzymes: No results for input(s): CKTOTAL, CKMB, CKMBINDEX, TROPONINI in the last 168 hours. BNP (last 3 results) No results for input(s): PROBNP in the last 8760 hours. HbA1C: No results for input(s): HGBA1C in the last 72 hours. CBG: No  results for input(s): GLUCAP in the last 168 hours. Lipid Profile: No results for input(s): CHOL, HDL, LDLCALC, TRIG, CHOLHDL, LDLDIRECT in the last 72 hours. Thyroid Function Tests: No results for input(s): TSH, T4TOTAL, FREET4, T3FREE, THYROIDAB in the last 72 hours. Anemia Panel: No results for input(s): VITAMINB12, FOLATE, FERRITIN, TIBC, IRON, RETICCTPCT in the last 72 hours. Sepsis Labs: No results for input(s): PROCALCITON, LATICACIDVEN in the last 168 hours.  Recent Results (from the past 240 hour(s))  SARS Coronavirus 2 (CEPHEID - Performed in [place] hospital lab), Hosp Order     Status: None   Collection Time: 10/18/18  5:12 PM   Specimen: Nasopharyngeal Swab  Result Value Ref Range Status   SARS Coronavirus 2 NEGATIVE NEGATIVE Final    Comment: (NOTE) If result is NEGATIVE SARS-CoV-2 target nucleic acids are NOT DETECTED. The SARS-CoV-2 RNA is generally detectable in upper and lower  respiratory specimens during the acute phase of infection. The lowest  concentration of SARS-CoV-2 viral copies this assay can detect is 250  copies / mL. A negative result does not preclude SARS-CoV-2 infection  and should not be used as the sole basis for treatment or other  patient management decisions.  A negative result may occur with  improper specimen collection / handling, submission of specimen other  than nasopharyngeal swab, presence of viral mutation(s) within the  areas targeted by this assay, and inadequate number of viral copies  (<250 copies / mL). A negative result must be combined with clinical  observations, patient history, and epidemiological information. If result is POSITIVE SARS-CoV-2 target nucleic acids are DETECTED. The SARS-CoV-2 RNA is generally detectable in upper and lower  respiratory specimens dur ing the acute phase of infection.  Positive  results are indicative of active infection with SARS-CoV-2.  Clinical  correlation with patient history and other  diagnostic information is  necessary to determine patient infection status.  Positive results do  not rule out bacterial infection or co-infection with other viruses. If result is PRESUMPTIVE POSTIVE SARS-CoV-2 nucleic acids MAY BE PRESENT.   A presumptive positive result was obtained on the submitted specimen  and confirmed on repeat testing.  While 2019 novel coronavirus  (SARS-CoV-2) nucleic acids may be present in the submitted sample  additional confirmatory testing may be necessary for epidemiological  and / or clinical management purposes  to differentiate between  SARS-CoV-2 and other Sarbecovirus currently known to infect humans.  If clinically indicated additional testing with an alternate test  methodology [phone]) is advised. The SARS-CoV-2 RNA is generally  detectable in upper and lower respiratory sp ecimens during the acute  phase of infection. The expected result is Negative. Fact Sheet for Patients:  StrictlyIdeas.no Fact Sheet for Healthcare Providers: BankingDealers.co.za This test is not yet approved or cleared by the [place] FDA and has been authorized for detection and/or  diagnosis of SARS-CoV-2 by FDA under an Emergency Use Authorization (EUA).  This EUA will remain in effect (meaning this test can be used) for the duration of the COVID-19 declaration under Section 564(b)(1) of the Act, 21 U.S.C. section 360bbb-3(b)(1), unless the authorization is terminated or revoked sooner. Performed at [facility], [place] [address], [place], [place] [address]   Surgical pcr screen     Status: None   Collection Time: 10/19/18  1:28 AM   Specimen: Nasal Mucosa; Nasal Swab  Result Value Ref Range Status   MRSA, PCR NEGATIVE NEGATIVE Final   Staphylococcus aureus NEGATIVE NEGATIVE Final    Comment: (NOTE) The Xpert SA Assay (FDA approved for NASAL specimens in patients 58 years of age and older), is one  component of a comprehensive surveillance program. It is not intended to diagnose infection nor to guide or monitor treatment. Performed at [facility], [place] [address], [place], [place] [address]          Radiology Studies: No results found.      Scheduled Meds: . calcium-vitamin D  1 tablet Oral TID  . docusate sodium  100 mg Oral BID  . losartan  100 mg Oral Daily   And  . hydrochlorothiazide  12.5 mg Oral Daily  . levothyroxine  50 mcg Oral Q0600  . midazolam  2 mg Intravenous Once  . midazolam PF  0.1 mg/kg Intravenous Once  . rivaroxaban  10 mg Oral Daily  . senna  1 tablet Oral BID  . simvastatin  40 mg Oral QPM  . vitamin B-12  1,000 mcg Oral Daily   Continuous Infusions: . lactated ringers Stopped (10/19/18 0619)     LOS: 4 days   Time spent= 25 mins  Verneita Griffes, MD [facility] 4:26 PM   If 7PM-7AM, please contact night-coverage [email or web address] 10/22/2018, 4:25 PM

## 2018-10-22 NOTE — NC FL2 (Signed)
[place] LEVEL OF CARE SCREENING TOOL     IDENTIFICATION  Patient Name: Becky Montoya Birthdate: 03-Mar-1927 Sex: female Admission Date (Current Location): 10/18/2018  [facility] and [place] Number:  Herbalist and Address:         Provider Number: [id number]  Attending Physician Name and Address:  Nita Sells, MD  Relative Name and Phone Number:  Shella Lahman, spouse; [phone]    Current Level of Care: Hospital Recommended Level of Care: [place] Prior Approval Number:    Date Approved/Denied:   PASRR Number: [id number]  Discharge Plan: SNF    Current Diagnoses: Patient Active Problem List   Diagnosis Date Noted  . Fall at home, initial encounter 10/19/2018  . Closed left subtrochanteric femur fracture, initial encounter ([place]) 10/18/2018  . Closed left subtrochanteric femur fracture ([place]) 10/18/2018  . Ductal carcinoma in situ (DCIS) of left breast 12/27/2016  . Mastitis, left, acute 12/19/2012  . Malignant neoplasm of upper-outer quadrant of left breast in female, estrogen receptor positive ([place]) 09/15/2012  . Chest pain 05/28/2012  . Hypothyroidism 05/28/2012  . Hypertension 05/28/2012  . Hyperlipidemia 05/28/2012    Orientation RESPIRATION BLADDER Height & Weight     Self, Time, Situation, Place  Normal Continent, External catheter Weight: 130 lb (59 kg) Height:  5\' 3"  (160 cm)  BEHAVIORAL SYMPTOMS/MOOD NEUROLOGICAL BOWEL NUTRITION STATUS      Continent Diet(see discharge summary)  AMBULATORY STATUS COMMUNICATION OF NEEDS Skin   Extensive Assist Verbally Surgical wounds(incision on left thigh with hydrocolloid dressing)                       Personal Care Assistance Level of Assistance  Bathing, Dressing, Feeding Bathing Assistance: Maximum assistance Feeding assistance: Independent Dressing Assistance: Maximum assistance     Functional Limitations Info  Sight, Speech, Hearing Sight Info:  Adequate Hearing Info: Adequate Speech Info: Adequate    SPECIAL CARE FACTORS FREQUENCY  PT (By licensed PT), OT (By licensed OT)     PT Frequency: 5x week OT Frequency: 5x week            Contractures Contractures Info: Not present    Additional Factors Info  Code Status, Allergies Code Status Info: Full Code Allergies Info: Wellbutrin (Bupropion)           Current Medications (10/22/2018):  This is the current hospital active medication list Current Facility-Administered Medications  Medication Dose Route Frequency Provider Last Rate Last Dose  . 0.9 %  sodium chloride infusion   Intravenous Continuous Leandrew Koyanagi, MD 75 mL/hr at 10/21/18 2244    . acetaminophen (TYLENOL) tablet 325-650 mg  325-650 mg Oral Q6H PRN Leandrew Koyanagi, MD   650 mg at 10/21/18 0520  . alum & mag hydroxide-simeth (MAALOX/MYLANTA) 200-200-20 MG/5ML suspension 30 mL  30 mL Oral Q4H PRN Leandrew Koyanagi, MD      . calcium-vitamin D (OSCAL WITH D) 500-200 MG-UNIT per tablet 1 tablet  1 tablet Oral TID Leandrew Koyanagi, MD   1 tablet at 10/22/18 0928  . docusate sodium (COLACE) capsule 100 mg  100 mg Oral BID Leandrew Koyanagi, MD   100 mg at 10/22/18 9678  . guaiFENesin-dextromethorphan (ROBITUSSIN DM) 100-10 MG/5ML syrup 5 mL  5 mL Oral Q4H PRN Amin, Ankit Chirag, MD      . hydrALAZINE (APRESOLINE) injection 10 mg  10 mg Intravenous Q4H PRN Amin, Jeanella Flattery, MD      .  losartan (COZAAR) tablet 100 mg  100 mg Oral Daily Leandrew Koyanagi, MD   100 mg at 10/22/18 6160   And  . hydrochlorothiazide (MICROZIDE) capsule 12.5 mg  12.5 mg Oral Daily Leandrew Koyanagi, MD   12.5 mg at 10/22/18 7371  . HYDROcodone-acetaminophen (NORCO/VICODIN) 5-325 MG per tablet 1-2 tablet  1-2 tablet Oral Q6H PRN Leandrew Koyanagi, MD   1 tablet at 10/21/18 2130  . hydrocortisone (ANUSOL-HC) 2.5 % rectal cream 1 application  1 application Topical QID PRN Amin, Ankit Chirag, MD      . hydrocortisone cream 1 % 1 application  1 application Topical  TID PRN Amin, Ankit Chirag, MD      . lactated ringers infusion   Intravenous Continuous Leandrew Koyanagi, MD   Stopped at 10/19/18 [phone]  . levothyroxine (SYNTHROID) tablet 50 mcg  50 mcg Oral Q0600 Leandrew Koyanagi, MD   50 mcg at 10/22/18 0522  . lip balm (CARMEX) ointment 1 application  1 application Topical PRN Amin, Ankit Chirag, MD      . loratadine (CLARITIN) tablet 10 mg  10 mg Oral Daily PRN Amin, Ankit Chirag, MD      . magnesium citrate solution 1 Bottle  1 Bottle Oral Once PRN Leandrew Koyanagi, MD      . menthol-cetylpyridinium (CEPACOL) lozenge 3 mg  1 lozenge Oral PRN Leandrew Koyanagi, MD       Or  . phenol (CHLORASEPTIC) mouth spray 1 spray  1 spray Mouth/Throat PRN Leandrew Koyanagi, MD      . methocarbamol (ROBAXIN) tablet 500 mg  500 mg Oral Q6H PRN Leandrew Koyanagi, MD       Or  . methocarbamol (ROBAXIN) 500 mg in dextrose 5 % 50 mL IVPB  500 mg Intravenous Q6H PRN Leandrew Koyanagi, MD      . midazolam (VERSED) injection 2 mg  2 mg Intravenous Once Leandrew Koyanagi, MD      . midazolam PF (VERSED) injection 5.9 mg  0.1 mg/kg Intravenous Once Leandrew Koyanagi, MD      . morphine 2 MG/ML injection 1 mg  1 mg Intravenous Q2H PRN Leandrew Koyanagi, MD      . Muscle Rub CREA 1 application  1 application Topical PRN Amin, Ankit Chirag, MD      . ondansetron (ZOFRAN) tablet 4 mg  4 mg Oral Q6H PRN Leandrew Koyanagi, MD       Or  . ondansetron (ZOFRAN) injection 4 mg  4 mg Intravenous Q6H PRN Leandrew Koyanagi, MD      . polyethylene glycol (MIRALAX / GLYCOLAX) packet 17 g  17 g Oral Daily PRN Leandrew Koyanagi, MD      . polyvinyl alcohol (LIQUIFILM TEARS) 1.4 % ophthalmic solution 1 drop  1 drop Both Eyes PRN Amin, Ankit Chirag, MD      . rivaroxaban (XARELTO) tablet 10 mg  10 mg Oral Daily Leandrew Koyanagi, MD   10 mg at 10/22/18 9485  . senna (SENOKOT) tablet 8.6 mg  1 tablet Oral BID Leandrew Koyanagi, MD   8.6 mg at 10/21/18 2129  . senna-docusate (Senokot-S) tablet 2 tablet  2 tablet Oral QHS PRN Amin, Ankit Chirag, MD       . simvastatin (ZOCOR) tablet 40 mg  40 mg Oral QPM Leandrew Koyanagi, MD   40 mg at 10/21/18 1704  . sodium chloride (OCEAN) 0.65 % nasal spray 1  spray  1 spray Each Nare PRN Amin, Ankit Chirag, MD      . sodium chloride 0.9 % bolus 250 mL  250 mL Intravenous Once Leandrew Koyanagi, MD      . sorbitol 70 % solution 30 mL  30 mL Oral Daily PRN Leandrew Koyanagi, MD      . vitamin B-12 (CYANOCOBALAMIN) tablet 1,000 mcg  1,000 mcg Oral Daily Leandrew Koyanagi, MD   1,000 mcg at 10/22/18 9794  . zolpidem (AMBIEN) tablet 5 mg  5 mg Oral QHS PRN Leandrew Koyanagi, MD   5 mg at 10/21/18 2243     Discharge Medications: Please see discharge summary for a list of discharge medications.  Relevant Imaging Results:  Relevant Lab Results:   Additional Information SS[id number] [place], [place]

## 2018-10-22 NOTE — Progress Notes (Signed)
Called blood bank due to no call from them stating that pt blood was ready.

## 2018-10-22 NOTE — Progress Notes (Signed)
Occupational Therapy Treatment Patient Details Name: Becky Montoya MRN: [id number] DOB: Jan 07, 1927 Today's Date: 10/22/2018    History of present illness Becky Montoya is a 85 y.o. female with medical history significant of well-managed hypertension, hypothyroid disease, hyperlipidemia. Admitted s/p fall sustaining L hip fracture, pt underwent L IM [place]. Pt now also with intermittent R hip/groin pain and is pending MRI for possible pathologic fracture. Per Dr. Phoebe Sharps note pt currently WBAT.   OT comments  Pt progressing towards OT goals this session. Able to perform transfers with min A, short ambulation at min guard assist, peri care with set up and lateral leans. Grooming with set up in seated position. Max A for LB dressing, mod A for return supine in bed. Pt did not get approval for CIR level therapy even though she remains EXCELLENT candidate. Pt will require post-acute OT at the SNF level (preferrable) or if she declines and goes to daughter HHOT level. OT will continue to follow acutely.   Follow Up Recommendations  SNF;Home health OT;Supervision/Assistance - 24 hour(may dc to daughter's house)    Equipment Recommendations  3 in 1 bedside commode;Tub/shower bench    Recommendations for Other Services      Precautions / Restrictions Precautions Precautions: Fall Precaution Comments: pt pending R hip MRI for possible fracture Restrictions Weight Bearing Restrictions: Yes RLE Weight Bearing: Weight bearing as tolerated LLE Weight Bearing: Weight bearing as tolerated Other Position/Activity Restrictions: Pt pending MRI for R LE due to possible pathologic fracture, per Dr. Erlinda Hong notes pt remains WBAT       Mobility Bed Mobility Overal bed mobility: Needs Assistance Bed Mobility: Supine to Sit     Supine to sit: Mod assist Sit to supine: Mod assist   General bed mobility comments: assist for BLE back into bed  Transfers Overall transfer level: Needs assistance Equipment  used: Rolling walker (2 wheeled) Transfers: Sit to/from Stand Sit to Stand: Min assist         General transfer comment: Cues for hand placement to and from seated surface.    Balance Overall balance assessment: Needs assistance Sitting-balance support: Feet supported;No upper extremity supported Sitting balance-Leahy Scale: Good     Standing balance support: Bilateral upper extremity supported Standing balance-Leahy Scale: Poor Standing balance comment: dependent on RW                           ADL either performed or assessed with clinical judgement   ADL Overall ADL's : Needs assistance/impaired     Grooming: Wash/dry hands;Wash/dry face;Oral care;Set up;Sitting Grooming Details (indicate cue type and reason): on toilet and EOB today             Lower Body Dressing: Maximal assistance;Sit to/from stand   Toilet Transfer: Minimal assistance;Ambulation;RW Toilet Transfer Details (indicate cue type and reason): recliner>toilet with BSC overtop Toileting- Clothing Manipulation and Hygiene: Min guard;Sitting/lateral lean       Functional mobility during ADLs: Minimal assistance;Rolling walker;Cueing for safety       Vision       Perception     Praxis      Cognition Arousal/Alertness: Awake/alert Behavior During Therapy: WFL for tasks assessed/performed Overall Cognitive Status: Within Functional Limits for tasks assessed  Exercises General Exercises - Lower Extremity Ankle Circles/Pumps: AROM;Both;Supine;20 reps Quad Sets: AROM;10 reps;Supine;Left Long Arc Quad: Left;10 reps;Supine;AROM Heel Slides: AAROM;Left;10 reps;Supine Hip ABduction/ADduction: AAROM;Left;10 reps;Supine   Shoulder Instructions       General Comments      Pertinent Vitals/ Pain       Pain Assessment: 0-10 Pain Score: 6  Pain Location: BLE Pain Descriptors / Indicators: Discomfort;Grimacing;Sore Pain  Intervention(s): Monitored during session;Repositioned;Patient requesting pain meds-RN notified  Home Living                                          Prior Functioning/Environment              Frequency  Min 3X/week        Progress Toward Goals  OT Goals(current goals can now be found in the care plan section)  Progress towards OT goals: Progressing toward goals  Acute Rehab OT Goals Patient Stated Goal: to get better OT Goal Formulation: With patient Time For Goal Achievement: 11/03/18 Potential to Achieve Goals: Good  Plan Discharge plan needs to be updated;Frequency needs to be updated    Co-evaluation                 AM-PAC OT "6 Clicks" Daily Activity     Outcome Measure   Help from another person eating meals?: None Help from another person taking care of personal grooming?: A Little(seated) Help from another person toileting, which includes using toliet, bedpan, or urinal?: A Little Help from another person bathing (including washing, rinsing, drying)?: A Lot Help from another person to put on and taking off regular upper body clothing?: A Little Help from another person to put on and taking off regular lower body clothing?: A Lot 6 Click Score: 17    End of Session Equipment Utilized During Treatment: Gait belt;Rolling walker  OT Visit Diagnosis: Unsteadiness on feet (R26.81);Other abnormalities of gait and mobility (R26.89);History of falling (Z91.81);Muscle weakness (generalized) (M62.81);Pain Pain - Right/Left: Left(and Right) Pain - part of body: Leg   Activity Tolerance Patient tolerated treatment well   Patient Left in bed;with call bell/phone within reach;with bed alarm set   Nurse Communication Mobility status;Patient requests pain meds        Time: 1425-1510 OT Time Calculation (min): 45 min  Charges: OT General Charges $OT Visit: 1 Visit OT Treatments $Self Care/Home Management : 23-37 mins $Therapeutic  Activity: 8-22 mins  Hulda Humphrey OTR/L Acute Rehabilitation Services Pager: [phone] Office: [place] 10/22/2018, 4:26 PM

## 2018-10-22 NOTE — TOC Initial Note (Signed)
Transition of Care [facility]) - Initial/Assessment Note    Patient Details  Name: Becky Montoya MRN: [id number] Date of Birth: 1926/12/25  Transition of Care [facility]) CM/SW Contact:    Alberteen Sam, [place] Phone Number: [phone] 10/22/2018, 1:02 PM  Clinical Narrative:                  CSW consulted with patient regarding discharge planning, patient agreeable to SNF at discharge for short term rehab. She reports preference for [facility] and agrees for her information to be faxed out to facilities in [place] area for bed offers. CSW explained insurance authorization, patient expressed understanding and will be updated on bed offers as soon as received.   Expected Discharge Plan: Skilled Nursing Facility Barriers to Discharge: Continued Medical Work up   Patient Goals and CMS Choice Patient states their goals for this hospitalization and ongoing recovery are:: to go to rehab then home CMS Medicare.gov Compare Post Acute Care list provided to:: Patient Choice offered to / list presented to : Patient  Expected Discharge Plan and Services Expected Discharge Plan: [place] Choice: [place] Living arrangements for the past 2 months: [place] Expected Discharge Date: 10/21/18                                    Prior Living Arrangements/Services Living arrangements for the past 2 months: [place] Lives with:: Self Patient language and need for interpreter reviewed:: Yes Do you feel safe going back to the place where you live?: No   needs short term rehab  Need for Family Participation in Patient Care: No (Comment) Care giver support system in place?: Yes (comment)   Criminal Activity/Legal Involvement Pertinent to Current Situation/Hospitalization: No - Comment as needed  Activities of Daily Living Home Assistive Devices/Equipment: Cane (specify quad or straight) ADL Screening (condition  at time of admission) Patient's cognitive ability adequate to safely complete daily activities?: Yes Is the patient deaf or have difficulty hearing?: No Does the patient have difficulty seeing, even when wearing glasses/contacts?: No Does the patient have difficulty concentrating, remembering, or making decisions?: No Patient able to express need for assistance with ADLs?: Yes Does the patient have difficulty dressing or bathing?: No Independently performs ADLs?: Yes (appropriate for developmental age) Does the patient have difficulty walking or climbing stairs?: Yes Weakness of Legs: Both Weakness of Arms/Hands: None  Permission Sought/Granted Permission sought to share information with : Case Manager, Chartered certified accountant granted to share information with : Yes, Verbal Permission Granted     Permission granted to share info w AGENCY: SNFs        Emotional Assessment Appearance:: Appears stated age Attitude/Demeanor/Rapport: Gracious Affect (typically observed): Calm Orientation: : Oriented to Self, Oriented to Place, Oriented to  Time, Oriented to Situation Alcohol / Substance Use: Not Applicable Psych Involvement: No (comment)  Admission diagnosis:  Preop exam for internal medicine [Z01.818] Closed fracture of left femur, unspecified fracture morphology, unspecified portion of femur, initial encounter [facility]) [S72.92XA] Patient Active Problem List   Diagnosis Date Noted  . Fall at home, initial encounter 10/19/2018  . Closed left subtrochanteric femur fracture, initial encounter ([place]) 10/18/2018  . Closed left subtrochanteric femur fracture ([place]) 10/18/2018  . Ductal carcinoma in situ (DCIS) of left breast 12/27/2016  . Mastitis, left, acute 12/19/2012  . Malignant neoplasm of upper-outer  quadrant of left breast in female, estrogen receptor positive ([place]) 09/15/2012  . Chest pain 05/28/2012  . Hypothyroidism 05/28/2012  . Hypertension 05/28/2012  .  Hyperlipidemia 05/28/2012   PCP:  Harlan Stains, MD Pharmacy:   [place], [place] - [address] [place] [address] Phone: [phone] Fax: [phone]     Social Determinants of Health (SDOH) Interventions    Readmission Risk Interventions No flowsheet data found.

## 2018-10-22 NOTE — Progress Notes (Addendum)
   Subjective:  Patient reports pain as mild.  No events.  Objective:   VITALS:   Vitals:   10/21/18 1956 10/22/18 0434 10/22/18 0859 10/22/18 1413  BP: (!) 161/60 (!) 141/68 (!) 143/61 (!) 141/64  Pulse: (!) 101 95 (!) 113 96  Resp: 20 19 16 16   Temp: 97.8 F (36.6 C) 98.7 F (37.1 C) 98.4 F (36.9 C) 98.6 F (37 C)  TempSrc: Oral Oral Oral Oral  SpO2: 99% 97% 100% 100%  Weight:      Height:        Neurologically intact Neurovascular intact Sensation intact distally Intact pulses distally Dorsiflexion/Plantar flexion intact Incision: dressing C/D/I and no drainage No cellulitis present Compartment soft   Lab Results  Component Value Date   WBC 7.0 10/22/2018   HGB 7.4 (L) 10/22/2018   HCT 22.1 (L) 10/22/2018   MCV 94.4 10/22/2018   PLT 180 10/22/2018     Assessment/Plan:  3 Days Post-Op   - Expected postop acute blood loss anemia - will monitor for symptoms, consider transfusing RBCs - Up with PT/OT - DVT ppx - SCDs, ambulation, lovenox - WBAT BLE - Pain control  Eduard Roux 10/22/2018, 5:13 PM [phone]

## 2018-10-22 NOTE — Progress Notes (Signed)
ANTICOAGULATION CONSULT NOTE - Initial Consult  Pharmacy Consult for Lovenox Indication: VTE prophylaxis  Allergies  Allergen Reactions  . Wellbutrin [Bupropion] Other (See Comments)    Nervousness    Patient Measurements: Height: 5\' 3"  (160 cm) Weight: 130 lb (59 kg) IBW/kg (Calculated) : 52.4  Vital Signs: Temp: 98.6 F (37 C) (07/15 1413) Temp Source: Oral (07/15 1413) BP: 141/64 (07/15 1413) Pulse Rate: 96 (07/15 1413)  Labs: Recent Labs    10/20/18 0459 10/21/18 0318 10/22/18 0400  HGB 9.1* 8.0* 7.4*  HCT 27.5* 24.0* 22.1*  PLT 183 147* 180  CREATININE 0.74 0.64 0.59    Estimated Creatinine Clearance: 38.7 mL/min (by C-G formula based on SCr of 0.59 mg/dL).  Medical History: Past Medical History:  Diagnosis Date  . Arthritis   . Breast cancer ([place])   . Hyperlipidemia   . Hypertension   . Osteoporosis   . Thyroid disease     Assessment: 85 yr old female with left hip fracture, S/P treatment of subtrochanteric fracture with intramedullary implant on 7/12. She has been receiving Xarelto 10 mg po daily for VTE prophylaxis since surgery, with last dose on 7/15 AM. Pharmacy consulted to convert VTE prophylaxis from Xarelto to Lovenox. Renal function stable; TBW CrCL ~59 ml/min. H/H  7.4/22.1.  Goal of Therapy:  Primary prevention of VTE Monitor platelets by anticoagulation protocol: Yes   Plan:  Initiate Lovenox 40 mg SQ daily, starting on 7/16 AM. Monitor renal function, CBC, signs/symptoms of bleeding, signs of VTE.  Gillermina Hu, PharmD, BCPS, FCCP 10/22/2018,5:39 PM

## 2018-10-22 NOTE — Plan of Care (Signed)

## 2018-10-22 NOTE — Progress Notes (Signed)
Physical Therapy Treatment Patient Details Name: Becky Montoya MRN: [id number] DOB: 1926-04-27 Today's Date: 10/22/2018    History of Present Illness Becky Montoya is a 85 y.o. female with medical history significant of well-managed hypertension, hypothyroid disease, hyperlipidemia. Admitted s/p fall sustaining L hip fracture, pt underwent L IM Nail. Pt now also with intermittent R hip/groin pain and is pending MRI for possible pathologic fracture. Per Dr. Phoebe Sharps note pt currently WBAT.    PT Comments    Pt is slow and guarded during session but motivated to progress.  She continues to require min-mod assistance for all mobility.  Pt continues to benefit from rehab in a post acute setting.  Plan next session for gt training and increased activity tolerance.     Follow Up Recommendations  CIR     Equipment Recommendations  None recommended by PT    Recommendations for Other Services       Precautions / Restrictions Precautions Precautions: Fall Precaution Comments: pt pending R hip MRI for possible fracture Restrictions Weight Bearing Restrictions: Yes RLE Weight Bearing: Weight bearing as tolerated LLE Weight Bearing: Weight bearing as tolerated Other Position/Activity Restrictions: Pt pending MRI for R LE due to possible pathologic fracture, per Dr. Erlinda Hong notes pt remains WBAT    Mobility  Bed Mobility Overal bed mobility: Needs Assistance Bed Mobility: Supine to Sit     Supine to sit: Mod assist     General bed mobility comments: Pt required assistance to advance B LEs to edge of bed and elevate trunk into sitting.  Transfers Overall transfer level: Needs assistance Equipment used: Rolling walker (2 wheeled) Transfers: Sit to/from Stand Sit to Stand: Min assist         General transfer comment: Cues for hand placement to and from seated surface.  Ambulation/Gait Ambulation/Gait assistance: Min assist Gait Distance (Feet): 30 Feet Assistive device: Rolling  walker (2 wheeled) Gait Pattern/deviations: Step-to pattern;Decreased stride length Gait velocity: slow   General Gait Details: Cues for weight shifting L to improve R foot clearance.  Pt with slow gt.   Stairs             Wheelchair Mobility    Modified Rankin (Stroke Patients Only)       Balance Overall balance assessment: Needs assistance   Sitting balance-Leahy Scale: Good     Standing balance support: Bilateral upper extremity supported Standing balance-Leahy Scale: Poor Standing balance comment: dependent on RW                            Cognition Arousal/Alertness: Awake/alert Behavior During Therapy: WFL for tasks assessed/performed Overall Cognitive Status: Within Functional Limits for tasks assessed                                        Exercises General Exercises - Lower Extremity Ankle Circles/Pumps: AROM;Both;Supine;20 reps Quad Sets: AROM;10 reps;Supine;Left Long Arc Quad: Left;10 reps;Supine;AROM Heel Slides: AAROM;Left;10 reps;Supine Hip ABduction/ADduction: AAROM;Left;10 reps;Supine    General Comments        Pertinent Vitals/Pain Pain Assessment: 0-10 Pain Score: 6  Pain Location: LLE Pain Descriptors / Indicators: Discomfort Pain Intervention(s): Monitored during session;Repositioned;Limited activity within patient's tolerance    Home Living                      Prior Function  PT Goals (current goals can now be found in the care plan section) Acute Rehab PT Goals Patient Stated Goal: to get better Potential to Achieve Goals: Good    Frequency    Min 5X/week      PT Plan Current plan remains appropriate    Co-evaluation              AM-PAC PT "6 Clicks" Mobility   Outcome Measure  Help needed turning from your back to your side while in a flat bed without using bedrails?: A Lot Help needed moving from lying on your back to sitting on the side of a flat bed  without using bedrails?: A Lot Help needed moving to and from a bed to a chair (including a wheelchair)?: A Little Help needed standing up from a chair using your arms (e.g., wheelchair or bedside chair)?: A Little Help needed to walk in hospital room?: A Little Help needed climbing 3-5 steps with a railing? : A Lot 6 Click Score: 15    End of Session Equipment Utilized During Treatment: Gait belt Activity Tolerance: Patient tolerated treatment well Patient left: with call bell/phone within reach;in chair;with chair alarm set Nurse Communication: Mobility status PT Visit Diagnosis: Unsteadiness on feet (R26.81);Difficulty in walking, not elsewhere classified (R26.2)     Time: 4696-2952 PT Time Calculation (min) (ACUTE ONLY): 25 min  Charges:  $Gait Training: 8-22 mins $Therapeutic Exercise: 8-22 mins                     Governor Rooks, PTA Acute Rehabilitation Services Pager [phone] Office [phone]     [place] Pindell Eli Hose 10/22/2018, 1:34 PM

## 2018-10-23 LAB — TYPE AND SCREEN
ABO/RH(D): A NEG
Antibody Screen: NEGATIVE
Unit division: 0

## 2018-10-23 LAB — CBC WITH DIFFERENTIAL/PLATELET
Abs Immature Granulocytes: 0.04 10*3/uL (ref 0.00–0.07)
Basophils Absolute: 0 10*3/uL (ref 0.0–0.1)
Basophils Relative: 1 %
Eosinophils Absolute: 0.3 10*3/uL (ref 0.0–0.5)
Eosinophils Relative: 5 %
HCT: 26.3 % — ABNORMAL LOW (ref 36.0–46.0)
Hemoglobin: 8.8 g/dL — ABNORMAL LOW (ref 12.0–15.0)
Immature Granulocytes: 1 %
Lymphocytes Relative: 28 %
Lymphs Abs: 1.8 10*3/uL (ref 0.7–4.0)
MCH: 30.8 pg (ref 26.0–34.0)
MCHC: 33.5 g/dL (ref 30.0–36.0)
MCV: 92 fL (ref 80.0–100.0)
Monocytes Absolute: 0.7 10*3/uL (ref 0.1–1.0)
Monocytes Relative: 12 %
Neutro Abs: 3.5 10*3/uL (ref 1.7–7.7)
Neutrophils Relative %: 53 %
Platelets: 207 10*3/uL (ref 150–400)
RBC: 2.86 MIL/uL — ABNORMAL LOW (ref 3.87–5.11)
RDW: 13 % (ref 11.5–15.5)
WBC: 6.4 10*3/uL (ref 4.0–10.5)
nRBC: 0.3 % — ABNORMAL HIGH (ref 0.0–0.2)

## 2018-10-23 LAB — BASIC METABOLIC PANEL
Anion gap: 6 (ref 5–15)
BUN: 7 mg/dL — ABNORMAL LOW (ref 8–23)
CO2: 24 mmol/L (ref 22–32)
Calcium: 8.2 mg/dL — ABNORMAL LOW (ref 8.9–10.3)
Chloride: 110 mmol/L (ref 98–111)
Creatinine, Ser: 0.57 mg/dL (ref 0.44–1.00)
GFR calc Af Amer: >60 mL/min (ref >60)
GFR calc non Af Amer: >60 mL/min (ref >60)
Glucose, Bld: 106 mg/dL — ABNORMAL HIGH (ref 70–99)
Potassium: 3.4 mmol/L — ABNORMAL LOW (ref 3.5–5.1)
Sodium: 140 mmol/L (ref 135–145)

## 2018-10-23 LAB — BPAM RBC
Blood Product Expiration Date: 202007212359
ISSUE DATE / TIME: 202007152328
Unit Type and Rh: 600

## 2018-10-23 LAB — MAGNESIUM: Magnesium: 1.9 mg/dL (ref 1.7–2.4)

## 2018-10-23 NOTE — Progress Notes (Signed)
RN attempted to call and update pt's spouse, Hassie Mandt, on pt status. Unsuccessful at this time. Will continue to monitor.

## 2018-10-23 NOTE — Plan of Care (Signed)
Problem: Education: Goal: Knowledge of General Education information will improve Description: Including pain rating scale, medication(s)/side effects and non-pharmacologic comfort measures Outcome: Progressing   Problem: Health Behavior/Discharge Planning: Goal: Ability to manage health-related needs will improve Outcome: Progressing   Problem: Clinical Measurements: Goal: Ability to maintain clinical measurements within normal limits will improve Outcome: Progressing Goal: Will remain free from infection Outcome: Progressing Goal: Respiratory complications will improve Outcome: Progressing Goal: Cardiovascular complication will be avoided Outcome: Progressing   Problem: Activity: Goal: Risk for activity intolerance will decrease Outcome: Progressing   Problem: Nutrition: Goal: Adequate nutrition will be maintained Outcome: Progressing   Problem: Coping: Goal: Level of anxiety will decrease Outcome: Progressing   Problem: Pain Managment: Goal: General experience of comfort will improve Outcome: Progressing   Problem: Safety: Goal: Ability to remain free from injury will improve Outcome: Progressing   Problem: Skin Integrity: Goal: Risk for impaired skin integrity will decrease Outcome: Progressing

## 2018-10-23 NOTE — Progress Notes (Signed)
Patient's preference of [facility] reports bed avail tomorrow if patient medically stable tomorrow. Insurance auth started.   [place], [place]

## 2018-10-23 NOTE — Progress Notes (Signed)
Attempted x2 to place a one time order for H&H post blood transfusion per MD order. Computer system kept canceling order (duplication order) due to pt scheduled for CBC @0500 .

## 2018-10-23 NOTE — Progress Notes (Addendum)
Physical Therapy Treatment Patient Details Name: Becky Montoya MRN: [id number] DOB: February 25, 1927 Today's Date: 10/23/2018    History of Present Illness KAELEE Montoya is a 85 y.o. female with medical history significant of well-managed hypertension, hypothyroid disease, hyperlipidemia. Admitted s/p fall sustaining L hip fracture, pt underwent L IM Nail. Pt now also with intermittent R hip/groin pain and is pending MRI for possible pathologic fracture. Per Dr. Phoebe Sharps note pt currently WBAT.    PT Comments    Pt slow and guarded during session.  She performed short gt trial and stand pivot to [facility].  Pt continues to require moderate assistance for bed mobility and min to min guard for transfers and gt training.  Plan next session for continued progression of activity tolerance.  Pt denied CIR placement therefore will update recommendations to SNF placement.  Pt is accepting of placement.  Will inform supervising PT of need for change in recommendations at this time.       Follow Up Recommendations  SNF     Equipment Recommendations  None recommended by PT    Recommendations for Other Services Rehab consult     Precautions / Restrictions Precautions Precautions: Fall Precaution Comments: pt pending R hip MRI for possible fracture Restrictions Weight Bearing Restrictions: Yes RLE Weight Bearing: Weight bearing as tolerated LLE Weight Bearing: Weight bearing as tolerated    Mobility  Bed Mobility Overal bed mobility: Needs Assistance Bed Mobility: Supine to Sit     Supine to sit: Mod assist     General bed mobility comments: Pt required assistance to advance LEs to edge of bed and to elevate trunk into stting.  Transfers Overall transfer level: Needs assistance Equipment used: Rolling walker (2 wheeled) Transfers: Sit to/from Stand Sit to Stand: Min assist         General transfer comment: Cues for hand placement to and from seated  surface.  Ambulation/Gait Ambulation/Gait assistance: Min guard Gait Distance (Feet): 35 Feet Assistive device: Rolling walker (2 wheeled) Gait Pattern/deviations: Step-to pattern;Decreased stride length Gait velocity: slow   General Gait Details: Cues for weight shifting L to improve R foot clearance.  Pt with slow gt.   Stairs             Wheelchair Mobility    Modified Rankin (Stroke Patients Only)       Balance Overall balance assessment: Needs assistance Sitting-balance support: Feet supported;No upper extremity supported Sitting balance-Leahy Scale: Good     Standing balance support: Bilateral upper extremity supported Standing balance-Leahy Scale: Poor                              Cognition Arousal/Alertness: Awake/alert Behavior During Therapy: WFL for tasks assessed/performed Overall Cognitive Status: Within Functional Limits for tasks assessed                                        Exercises      General Comments        Pertinent Vitals/Pain Pain Assessment: Faces Pain Location: BLE L>R Pain Descriptors / Indicators: Discomfort;Grimacing;Sore Pain Intervention(s): Monitored during session;Repositioned    Home Living                      Prior Function            PT Goals (current goals can  now be found in the care plan section) Acute Rehab PT Goals Patient Stated Goal: to get better Potential to Achieve Goals: Good Progress towards PT goals: Progressing toward goals    Frequency    Min 5X/week      PT Plan Current plan remains appropriate    Co-evaluation              AM-PAC PT "6 Clicks" Mobility   Outcome Measure  Help needed turning from your back to your side while in a flat bed without using bedrails?: A Lot Help needed moving from lying on your back to sitting on the side of a flat bed without using bedrails?: A Lot Help needed moving to and from a bed to a chair (including  a wheelchair)?: A Little Help needed standing up from a chair using your arms (e.g., wheelchair or bedside chair)?: A Little Help needed to walk in hospital room?: A Little Help needed climbing 3-5 steps with a railing? : A Lot 6 Click Score: 15    End of Session Equipment Utilized During Treatment: Gait belt Activity Tolerance: Patient tolerated treatment well Patient left: with call bell/phone within reach;in chair;with chair alarm set Nurse Communication: Mobility status PT Visit Diagnosis: Unsteadiness on feet (R26.81);Difficulty in walking, not elsewhere classified (R26.2)     Time: 1610-9604 PT Time Calculation (min) (ACUTE ONLY): 21 min  Charges:  $Gait Training: 8-22 mins                     Governor Rooks, PTA Acute Rehabilitation Services Pager [phone] Office [phone]     Nicolas Sisler Eli Hose 10/23/2018, 3:40 PM

## 2018-10-23 NOTE — Progress Notes (Signed)
PROGRESS NOTE    Becky Montoya  YQM:[id number] DOB: Sep 24, 1926 DOA: 10/18/2018 PCP: Harlan Stains, MD   Brief Narrative:  78 ?essential hypertension, hypothyroidism and hyperlipidemia had a fall at home and suffered from left hip fracture.  She underwent surgical intervention on 10/19/2018.  She tolerated the procedure well. Had MRI contralateral hip which will be monitored as OP   Assessment & Plan:   Principal Problem:   Closed left subtrochanteric femur fracture, initial encounter [facility]) Active Problems:   Hypothyroidism   Hypertension   Hyperlipidemia   Closed left subtrochanteric femur fracture ([place])   Fall at home, initial encounter  Left hip fracture requiring subtrochanteric intramedullary implant Right hip pain with impending pathologic fracture -Status post repair 7/12 -DVT prophylaxis has been chosen as Lovenox per orthopedics - Pain control, bowel regimen - MRI of the right hip ordered by orthopedic--no impending # so OP close f/u -WBAT BLE + walker per ortho on d/c -Prolia to be held per orthopedic for at least 3 months  Anemia of acute blood loss from surgery component of dilutional anemia -Guaiac stool was negative for occult blood -Had expected blood loss from subtrochanteric fracture on the left side-she was transfused successfully to hemoglobin of 8.8 from 7.4 -She will need iron supplementation on discharge and iron T sat levels in the outpatient setting given her advanced age and likely inability to replete orally  Essential hypertension, only moderate control - Hydrochlorothiazide, losartan-might need to augment in the outpatient setting with addition of beta-blocker-review trends at nursing facility and augment as needed  Hypothyroidism - Synthroid 50 mcg daily, TSH in 3 weeks  GERD -PPI as needed  Hyperlipidemia -Statin.  DVT prophylaxis: Xarelto at present Code Status: Full code Family Communication: Called and updated daughter on 7/15  Disposition Plan: Pending CSW SNF placement which will likely occur 7/17 a.m.  Consultants:   Orthopedic, Dr Erlinda Hong  Procedures:   ORIF 7/12  Antimicrobials:   Perioperative cefazolin   Subjective:  Doing fair no distress some pain today with mobility and has not done much outside of that-she needed a pain pill last night secondary to pain Not very hungry No dark stool No chest pain Seems a little more flat than she was yesterday    Objective: Vitals:   10/23/18 0200 10/23/18 0203 10/23/18 0429 10/23/18 0940  BP: 138/63 138/63 (!) 165/58 (!) 150/63  Pulse: 84 84 86 96  Resp: _0 Temp: 98.4 F (36.9 C) 98.4 F (36.9 C) 98.9 F (37.2 C) 98.5 F (36.9 C)  TempSrc: Oral Oral Oral Oral  SpO2:  97% 96% 98%  Weight:      Height:        Intake/Output Summary (Last 24 hours) at 10/23/2018 1609 Last data filed at 10/23/2018 0900 Gross per 24 hour  Intake 555 ml  Output -  Net 555 ml   Filed Weights   10/18/18 1932  Weight: 59 kg    Examination:  Awake alert no distress EOMI NCAT No icterus no pallor Chest is clear without added sound Abdomen is soft no rebound no guarding SLR on left side is quite painful Pulses are intact  Data Reviewed:   CBC: Recent Labs  Lab 10/18/18 1645 10/20/18 0459 10/21/18 0318 10/22/18 0400 10/23/18 0415  WBC 6.8 10.5 8.1 7.0 6.4  NEUTROABS 4.4  --   --   --  3.5  HGB 13.6 9.1* 8.0* 7.4* 8.8*  HCT 42.6 27.5* 24.0* 22.1* 26.3*  MCV  98.6 93.2 92.3 94.4 92.0  PLT 180 183 147* 180 884   Basic Metabolic Panel: Recent Labs  Lab 10/19/18 0517 10/20/18 0459 10/21/18 0318 10/22/18 0400 10/23/18 0415  NA 137 135 140 139 140  K 3.5 3.6 3.0* 3.7 3.4*  CL 104 103 109 109 110  CO2 _0 GLUCOSE 145* 114* 120* 112* 106*  BUN _1 7*  CREATININE 0.73 0.74 0.64 0.59 0.57  CALCIUM 8.7* 7.7* 7.9* 7.8* 8.2*  MG  --  2.2 2.2 1.9 1.9   GFR: Estimated Creatinine Clearance: 38.7 mL/min (by C-G formula  based on SCr of 0.57 mg/dL). Liver Function Tests: No results for input(s): AST, ALT, ALKPHOS, BILITOT, PROT, ALBUMIN in the last 168 hours. No results for input(s): LIPASE, AMYLASE in the last 168 hours. No results for input(s): AMMONIA in the last 168 hours. Coagulation Profile: No results for input(s): INR, PROTIME in the last 168 hours. Cardiac Enzymes: No results for input(s): CKTOTAL, CKMB, CKMBINDEX, TROPONINI in the last 168 hours. BNP (last 3 results) No results for input(s): PROBNP in the last 8760 hours. HbA1C: No results for input(s): HGBA1C in the last 72 hours. CBG: No results for input(s): GLUCAP in the last 168 hours. Lipid Profile: No results for input(s): CHOL, HDL, LDLCALC, TRIG, CHOLHDL, LDLDIRECT in the last 72 hours. Thyroid Function Tests: No results for input(s): TSH, T4TOTAL, FREET4, T3FREE, THYROIDAB in the last 72 hours. Anemia Panel: No results for input(s): VITAMINB12, FOLATE, FERRITIN, TIBC, IRON, RETICCTPCT in the last 72 hours. Sepsis Labs: No results for input(s): PROCALCITON, LATICACIDVEN in the last 168 hours.  Recent Results (from the past 240 hour(s))  SARS Coronavirus 2 (CEPHEID - Performed in [place] hospital lab), Hosp Order     Status: None   Collection Time: 10/18/18  5:12 PM   Specimen: Nasopharyngeal Swab  Result Value Ref Range Status   SARS Coronavirus 2 NEGATIVE NEGATIVE Final    Comment: (NOTE) If result is NEGATIVE SARS-CoV-2 target nucleic acids are NOT DETECTED. The SARS-CoV-2 RNA is generally detectable in upper and lower  respiratory specimens during the acute phase of infection. The lowest  concentration of SARS-CoV-2 viral copies this assay can detect is 250  copies / mL. A negative result does not preclude SARS-CoV-2 infection  and should not be used as the sole basis for treatment or other  patient management decisions.  A negative result may occur with  improper specimen collection / handling, submission of specimen  other  than nasopharyngeal swab, presence of viral mutation(s) within the  areas targeted by this assay, and inadequate number of viral copies  (<250 copies / mL). A negative result must be combined with clinical  observations, patient history, and epidemiological information. If result is POSITIVE SARS-CoV-2 target nucleic acids are DETECTED. The SARS-CoV-2 RNA is generally detectable in upper and lower  respiratory specimens dur ing the acute phase of infection.  Positive  results are indicative of active infection with SARS-CoV-2.  Clinical  correlation with patient history and other diagnostic information is  necessary to determine patient infection status.  Positive results do  not rule out bacterial infection or co-infection with other viruses. If result is PRESUMPTIVE POSTIVE SARS-CoV-2 nucleic acids MAY BE PRESENT.   A presumptive positive result was obtained on the submitted specimen  and confirmed on repeat testing.  While 2019 novel coronavirus  (SARS-CoV-2) nucleic acids may be present in the submitted sample  additional confirmatory testing may  be necessary for epidemiological  and / or clinical management purposes  to differentiate between  SARS-CoV-2 and other Sarbecovirus currently known to infect humans.  If clinically indicated additional testing with an alternate test  methodology [phone]) is advised. The SARS-CoV-2 RNA is generally  detectable in upper and lower respiratory sp ecimens during the acute  phase of infection. The expected result is Negative. Fact Sheet for Patients:  StrictlyIdeas.no Fact Sheet for Healthcare Providers: BankingDealers.co.za This test is not yet approved or cleared by the [place] FDA and has been authorized for detection and/or diagnosis of SARS-CoV-2 by FDA under an Emergency Use Authorization (EUA).  This EUA will remain in effect (meaning this test can be used) for the duration  of the COVID-19 declaration under Section 564(b)(1) of the Act, 21 U.S.C. section 360bbb-3(b)(1), unless the authorization is terminated or revoked sooner. Performed at [facility], [place] [address] [place], [place] [address]   Surgical pcr screen     Status: None   Collection Time: 10/19/18  1:28 AM   Specimen: Nasal Mucosa; Nasal Swab  Result Value Ref Range Status   MRSA, PCR NEGATIVE NEGATIVE Final   Staphylococcus aureus NEGATIVE NEGATIVE Final    Comment: (NOTE) The Xpert SA Assay (FDA approved for NASAL specimens in patients 2 years of age and older), is one component of a comprehensive surveillance program. It is not intended to diagnose infection nor to guide or monitor treatment. Performed at [facility], [place] [address], [place], [place] [address]      Radiology Studies: No results found.  Scheduled Meds: . sodium chloride   Intravenous Once  . calcium-vitamin D  1 tablet Oral TID  . docusate sodium  100 mg Oral BID  . enoxaparin (LOVENOX) injection  40 mg Subcutaneous Daily  . ferrous sulfate  325 mg Oral BID WC  . losartan  100 mg Oral Daily   And  . hydrochlorothiazide  12.5 mg Oral Daily  . levothyroxine  50 mcg Oral Q0600  . midazolam  2 mg Intravenous Once  . midazolam PF  0.1 mg/kg Intravenous Once  . senna  1 tablet Oral BID  . simvastatin  40 mg Oral QPM  . vitamin B-12  1,000 mcg Oral Daily   Continuous Infusions: . lactated ringers Stopped (10/19/18 0619)     LOS: 5 days   Time spent= 25 mins  Verneita Griffes, MD [facility] 4:09 PM   If 7PM-7AM, please contact night-coverage [email or web address] 10/23/2018, 4:09 PM

## 2018-10-24 LAB — BASIC METABOLIC PANEL
Anion gap: 7 (ref 5–15)
BUN: 11 mg/dL (ref 8–23)
CO2: 23 mmol/L (ref 22–32)
Calcium: 8.3 mg/dL — ABNORMAL LOW (ref 8.9–10.3)
Chloride: 108 mmol/L (ref 98–111)
Creatinine, Ser: 0.6 mg/dL (ref 0.44–1.00)
GFR calc Af Amer: >60 mL/min (ref >60)
GFR calc non Af Amer: >60 mL/min (ref >60)
Glucose, Bld: 98 mg/dL (ref 70–99)
Potassium: 3.5 mmol/L (ref 3.5–5.1)
Sodium: 138 mmol/L (ref 135–145)

## 2018-10-24 LAB — MAGNESIUM: Magnesium: 2 mg/dL (ref 1.7–2.4)

## 2018-10-24 MED ORDER — ENOXAPARIN SODIUM 40 MG/0.4ML ~~LOC~~ SOLN: 40.0000 mg | Freq: Every day | SUBCUTANEOUS | Status: DC

## 2018-10-24 NOTE — Progress Notes (Signed)
Physical Therapy Treatment Patient Details Name: Becky Montoya MRN: [id number] DOB: Nov 04, 1926 Today's Date: 10/24/2018    History of Present Illness Becky Montoya is a 85 y.o. female with medical history significant of well-managed hypertension, hypothyroid disease, hyperlipidemia. Admitted s/p fall sustaining L hip fracture, pt underwent L IM [place]. Pt now also with intermittent R hip/groin pain and is pending MRI for possible pathologic fracture. Per Dr. Phoebe Sharps note pt currently WBAT.    PT Comments    Pt performed gt training and functional mobility with improved ease and decreased assistance.  She required supervision for bed mobility and min guard for transfers and gait training.  Pt progressed gt distance.  Plan for transfer to SNF today to improve strength and function before returning home.     Follow Up Recommendations  SNF     Equipment Recommendations  None recommended by PT    Recommendations for Other Services Rehab consult     Precautions / Restrictions Precautions Precautions: Fall Precaution Comments: pt pending R hip MRI for possible fracture Restrictions Weight Bearing Restrictions: Yes RLE Weight Bearing: Weight bearing as tolerated LLE Weight Bearing: Weight bearing as tolerated    Mobility  Bed Mobility Overal bed mobility: Needs Assistance Bed Mobility: Supine to Sit     Supine to sit: Supervision     General bed mobility comments: Pt able to come to sitting without assistance.  HOB elevated with use of rail and increased time.  Transfers Overall transfer level: Needs assistance Equipment used: Rolling walker (2 wheeled) Transfers: Sit to/from Stand Sit to Stand: Min guard         General transfer comment: Cues for hand placement and forward weight shifting  Ambulation/Gait Ambulation/Gait assistance: Min guard;+2 safety/equipment Gait Distance (Feet): 50 Feet Assistive device: Rolling walker (2 wheeled) Gait Pattern/deviations:  Step-to pattern;Decreased stride length Gait velocity: slow   General Gait Details: Cues for weight shifting L to improve R foot clearance.  Pt with slow gt.  Remains to require close chair follow for safety.   Stairs             Wheelchair Mobility    Modified Rankin (Stroke Patients Only)       Balance Overall balance assessment: Needs assistance   Sitting balance-Leahy Scale: Good       Standing balance-Leahy Scale: Poor                              Cognition Arousal/Alertness: Awake/alert Behavior During Therapy: WFL for tasks assessed/performed Overall Cognitive Status: Within Functional Limits for tasks assessed                                        Exercises      General Comments        Pertinent Vitals/Pain Pain Assessment: 0-10 Pain Score: 5  Pain Location: BLE L>R Pain Descriptors / Indicators: Discomfort;Grimacing;Sore Pain Intervention(s): Monitored during session;Repositioned;Limited activity within patient's tolerance    Home Living                      Prior Function            PT Goals (current goals can now be found in the care plan section) Acute Rehab PT Goals Patient Stated Goal: to get better Potential to Achieve Goals: Good Progress towards  PT goals: Progressing toward goals    Frequency    Min 5X/week      PT Plan Current plan remains appropriate    Co-evaluation              AM-PAC PT "6 Clicks" Mobility   Outcome Measure  Help needed turning from your back to your side while in a flat bed without using bedrails?: A Lot Help needed moving from lying on your back to sitting on the side of a flat bed without using bedrails?: A Lot Help needed moving to and from a bed to a chair (including a wheelchair)?: A Little Help needed standing up from a chair using your arms (e.g., wheelchair or bedside chair)?: A Little Help needed to walk in hospital room?: A Little Help  needed climbing 3-5 steps with a railing? : A Lot 6 Click Score: 15    End of Session Equipment Utilized During Treatment: Gait belt Activity Tolerance: Patient tolerated treatment well Patient left: with call bell/phone within reach;in chair;with chair alarm set Nurse Communication: Mobility status PT Visit Diagnosis: Unsteadiness on feet (R26.81);Difficulty in walking, not elsewhere classified (R26.2)     Time: 3329-5188 PT Time Calculation (min) (ACUTE ONLY): 16 min  Charges:  $Gait Training: 8-22 mins                     Becky Montoya, PTA Acute Rehabilitation Services Pager [phone] Office [phone]     Becky Montoya 10/24/2018, 11:40 AM

## 2018-10-24 NOTE — TOC Transition Note (Signed)
Transition of Care [facility]) - CM/SW Discharge Note   Patient Details  Name: Becky Montoya MRN: [id number] Date of Birth: Aug 20, 1926  [facility]) CM/SW Contact:  Alberteen Sam, LCSW Phone Number: 10/24/2018, 9:42 AM   Clinical Narrative:     Patient will DC to: [facility] Anticipated DC date: 10/24/2018 Family notified: Gwyndolyn Saxon Transport by: Corey Harold  Per MD patient ready for DC to [facility] . RN, patient, patient's family, and facility notified of DC. Discharge Summary sent to facility. RN given number for report [phone] . DC packet on chart. Ambulance transport requested for patient.  CSW signing off.  [place], [place]    Final next level of care: Skilled Nursing Facility Barriers to Discharge: No Barriers Identified   Patient Goals and CMS Choice Patient states their goals for this hospitalization and ongoing recovery are:: to go to rehab then home CMS Medicare.gov Compare Post Acute Care list provided to:: Patient Choice offered to / list presented to : Patient  Discharge Placement PASRR number recieved: 10/22/18            Patient chooses bed at: [facility] Patient to be transferred to facility by: [facility] Name of family member notified: Gwyndolyn Saxon (spouse) Patient and family notified of of transfer: 10/24/18  Discharge Plan and Services     Post Acute Care Choice: [place]                               Social Determinants of Health (SDOH) Interventions     Readmission Risk Interventions No flowsheet data found.

## 2018-10-24 NOTE — Plan of Care (Signed)
  Problem: Education: Goal: Knowledge of General Education information will improve Description Including pain rating scale, medication(s)/side effects and non-pharmacologic comfort measures Outcome: Progressing   

## 2018-10-24 NOTE — Progress Notes (Signed)
Discharge paperwork given to [facility] for receiving facility. Pt not in distress and tolerated well. Called twice to receiving facility to give report, but no answer. Left a message with my direct number for nurse to get report.

## 2018-10-24 NOTE — Discharge Summary (Addendum)
Physician Discharge Summary  QUINTASHA GREN ZOX:[id number] DOB: 03-27-1927 DOA: 10/18/2018  PCP: Harlan Stains, MD  Admit date: 10/18/2018 Discharge date: 10/24/2018  Admitted From: Home Disposition: Skilled nursing facility  Recommendations for Outpatient Follow-up:  1. Follow up with PCP in 1-2 weeks 2. Check BMP/CBC in next 3-5 days. 3. Postop DVT prophylaxis per orthopedic team.  Lovenox for 2 weeks ending on 7/26, weightbearing as tolerated at skilled facility 4. Prolia to be held for at least 3 months 5. Needs TSH in 3 to 6 weeks  Discharge Condition: Stable CODE STATUS: Full code Diet recommendation: 2 g salt  Brief/Interim Summary: 85 year old with history of essential hypertension, hypothyroidism and hyperlipidemia had a fall at home and suffered from left hip fracture.  She underwent surgical intervention on 10/19/2018.  She tolerated the procedure well.  There was also concern for possible right hip impending pathologic fracture therefore MRI was done which was reviewed by orthopedic and recommended outpatient monitoring, which will occur in the outpatient time otherwise weightbearing as tolerated with walker.  Physical therapy recommended CIR but was declined by insurance of going to skilled facility later today   Discharge Diagnoses:  Principal Problem:   Closed left subtrochanteric femur fracture, initial encounter [facility]) Active Problems:   Hypothyroidism   Hypertension   Hyperlipidemia   Closed left subtrochanteric femur fracture ([place])   Fall at home, initial encounter  Left hip fracture requiring subtrochanteric intramedullary implant Right hip pain with impending pathologic fracture -Status post repair 7/12 -DVT prophylaxis Lovenox as above and will be followed by Dr. Erlinda Hong as outpatient-he is aware - Pain control, bowel regimen -PT/OT recommending CIR, arrangements to be made by social worker and physical therapy team. -Incentive spirometer -  MRI of the right hip  is negative for concerns for any acute impending fracture, this will be monitored outpatient by orthopedic team. -Prolia to be held per orthopedic for at least 3 months  Acute blood loss anemia -Baseline hemoglobin appears to be close to 13 at the time of admission, dropped to a nadir of 7.4 patient was transfused 1 unit PRBC-needs labs in the outpatient setting  Hypokalemia -Resolved with replacement-outpatient labs needed as above  Essential hypertension - Hydrochlorothiazide, losartan on discharge and continue  Hypothyroidism - Synthroid 50 mcg daily  GERD -PPI as needed  Hyperlipidemia -Statin.  Consultations:  Orthopedic  Subjective: Doing well slightly emotional today but able to move minimally with her left leg No other spontaneous complaints  Discharge Exam: Vitals:   10/24/18 0254 10/24/18 0823  BP: (!) 166/62 (!) 160/70  Pulse: 96 91  Resp: 18 16  Temp: 99.1 F (37.3 C) 98.3 F (36.8 C)  SpO2: 97% 97%   Vitals:   10/23/18 1624 10/23/18 1952 10/24/18 0254 10/24/18 0823  BP: (!) 146/63 (!) 162/76 (!) 166/62 (!) 160/70  Pulse: 88 93 96 91  Resp: _0 Temp: 98.6 F (37 C) 98 F (36.7 C) 99.1 F (37.3 C) 98.3 F (36.8 C)  TempSrc: Oral Oral Oral Oral  SpO2: 99% 98% 97% 97%  Weight:      Height:        Pleasant oriented alert slightly emotional Arcus senilis Oriented S1-S2 no murmur Chest clinically clear no added sound Abdomen soft Bandages noted on left lower extremity at knee and hip staples intact wound seems very clean. Discharge Instructions  Discharge Instructions    Call MD for:  persistant dizziness or light-headedness   Complete by: As directed  Call MD for:  severe uncontrolled pain   Complete by: As directed    Diet - low sodium heart healthy   Complete by: As directed    Increase activity slowly   Complete by: As directed    Weight bearing as tolerated   Complete by: As directed      Allergies as of 10/24/2018       Reactions   Wellbutrin [bupropion] Other (See Comments)   Nervousness      Medication List    TAKE these medications   aspirin EC 81 MG tablet Take 81 mg by mouth every morning.   denosumab 60 MG/ML Soln injection Commonly known as: PROLIA Inject 60 mg into the skin every 6 (six) months. Administer in upper arm, thigh, or abdomen   enoxaparin 40 MG/0.4ML injection Commonly known as: LOVENOX Inject 0.4 mLs (40 mg total) into the skin daily. Start taking on: October 25, 2018   ferrous sulfate 325 (65 FE) MG tablet Take 1 tablet (325 mg total) by mouth 2 (two) times daily with a meal.   HYDROcodone-acetaminophen 7.5-325 MG tablet Commonly known as: Norco Take 1-2 tablets by mouth 3 (three) times daily as needed for moderate pain.   levothyroxine 50 MCG tablet Commonly known as: SYNTHROID Take 50 mcg by mouth every morning.   losartan-hydrochlorothiazide 100-12.5 MG tablet Commonly known as: HYZAAR Take 1 tablet by mouth every morning.   Lutein 20 Caps Take 1 capsule by mouth every morning.   Magnesium 250 MG Tabs Take 250 mg by mouth daily.   OMEGA 3 PO Take 1 capsule by mouth daily.   polyethylene glycol 17 g packet Commonly known as: MIRALAX / GLYCOLAX Take 17 g by mouth daily as needed for mild constipation.   senna-docusate 8.6-50 MG tablet Commonly known as: Senokot-S Take 2 tablets by mouth at bedtime as needed for mild constipation or moderate constipation.   simvastatin 20 MG tablet Commonly known as: ZOCOR Take 20 mg by mouth every evening.   vitamin E 400 UNIT capsule Take 400 Units by mouth every evening.            Discharge Care Instructions  (From admission, onward)         Start     Ordered   10/19/18 0000  Weight bearing as tolerated     10/19/18 8299         Follow-up Information    Leandrew Koyanagi, MD In 2 weeks.   Specialty: Orthopedic Surgery Why: For suture removal, For wound re-check Contact information: [place] [address] [phone]        Harlan Stains, MD. Schedule an appointment as soon as possible for a visit in 1 week(s).   Specialty: Family Medicine Contact information: [address] [place] [address] [phone]          Allergies  Allergen Reactions  . Wellbutrin [Bupropion] Other (See Comments)    Nervousness    You were cared for by a hospitalist during your hospital stay. If you have any questions about your discharge medications or the care you received while you were in the hospital after you are discharged, you can call the unit and asked to speak with the hospitalist on call if the hospitalist that took care of you is not available. Once you are discharged, your primary care physician will handle any further medical issues. Please note that no refills for any discharge medications will be authorized  once you are discharged, as it is imperative that you return to your primary care physician (or establish a relationship with a primary care physician if you do not have one) for your aftercare needs so that they can reassess your need for medications and monitor your lab values.   Procedures/Studies: Pelvis Portable  Result Date: 10/19/2018 CLINICAL DATA:  Status post ORIF of a proximal left femur fracture. EXAM: PORTABLE PELVIS 1-2 VIEWS COMPARISON:  10/18/2018 FINDINGS: Single image shows a long intramedullary rod supporting 2 screws. Fracture of the proximal femur, which is below the intertrochanteric line, has been significantly reduced. Minimal residual displacement is noted, the proximal fracture component displaced 7-8 mm laterally. IMPRESSION: Status post ORIF of a proximal left femur fracture. Fracture has been significantly reduced with only minimal residual displacement. Electronically Signed   By: Lajean Manes M.D.   On: 10/19/2018 10:01   Mr Frmur Right Wo Contrast  Result Date: 10/20/2018 CLINICAL DATA:  Right  subtrochanteric femur stress fracture. EXAM: MRI OF THE RIGHT FEMUR WITHOUT CONTRAST TECHNIQUE: Multiplanar, multisequence MR imaging of the right femur was performed. No intravenous contrast was administered. COMPARISON:  Pelvic x-rays dated October 19, 2018. FINDINGS: Bones/Joint/Cartilage There is focal cortical thickening along the lateral aspect of the proximal subtrochanteric right femur without adjacent marrow edema or periosteal reaction. Unchanged left subtrochanteric femur fracture status post intramedullary rod placement with associated susceptibility artifact. No dislocation. No focal bone lesion. No significant joint effusion. Muscles and Tendons Mild edema in the left quadriceps and hamstring muscles, as well as the bilateral adductor muscles. No muscle atrophy. Soft tissue Small amount of fluid in the right greater trochanteric bursa. No fluid collection or hematoma. No soft tissue mass. IMPRESSION: 1. Focal cortical thickening along the lateral aspect of the proximal subtrochanteric right femur without adjacent marrow edema or periosteal reaction, consistent with healed stress fracture. 2. Unchanged acute left subtrochanteric femur fracture status post ORIF. Electronically Signed   By: Titus Dubin M.D.   On: 10/20/2018 12:49   Chest Portable 1 View  Result Date: 10/18/2018 CLINICAL DATA:  Preoperative study.  Known hip fracture. EXAM: PORTABLE CHEST 1 VIEW COMPARISON:  None. FINDINGS: The heart size and mediastinal contours are within normal limits. Both lungs are clear. The visualized skeletal structures are unremarkable. IMPRESSION: No active disease. Electronically Signed   By: Dorise Bullion III M.D   On: 10/18/2018 19:06   Dg C-arm 1-60 Min  Result Date: 10/19/2018 CLINICAL DATA:  Status post ORIF of a left proximal femur fracture. EXAM: LEFT FEMUR 2 VIEWS; DG C-ARM 61-120 MIN COMPARISON:  None. FINDINGS: Portable images demonstrate placement of an intramedullary rod supporting  compression screws, reducing the fracture into near anatomic alignment. Orthopedic hardware appears well seated. There is no new fracture or evidence of an operative complication. IMPRESSION: Well aligned proximal left femur fracture following ORIF. Electronically Signed   By: Lajean Manes M.D.   On: 10/19/2018 10:00   Dg Hip Unilat With Pelvis 2-3 Views Left  Result Date: 10/18/2018 CLINICAL DATA:  Left hip pain post fall. EXAM: DG HIP (WITH OR WITHOUT PELVIS) 2-3V LEFT COMPARISON:  None. FINDINGS: Transverse fracture of the left proximal femur, just distal to the trochanteric region. The left hip is located with varus rotation of the proximal fracture fragment, and superior displacement of the distal fracture fragment. IMPRESSION: Displaced transverse fracture of the left proximal femur. Electronically Signed   By: Fidela Salisbury M.D.   On: 10/18/2018 17:26   Dg  Femur Min 2 Views Left  Result Date: 10/19/2018 CLINICAL DATA:  Status post ORIF of a left proximal femur fracture. EXAM: LEFT FEMUR 2 VIEWS; DG C-ARM 61-120 MIN COMPARISON:  None. FINDINGS: Portable images demonstrate placement of an intramedullary rod supporting compression screws, reducing the fracture into near anatomic alignment. Orthopedic hardware appears well seated. There is no new fracture or evidence of an operative complication. IMPRESSION: Well aligned proximal left femur fracture following ORIF. Electronically Signed   By: Lajean Manes M.D.   On: 10/19/2018 10:00   Dg Femur Min 2 Views Left  Result Date: 10/18/2018 CLINICAL DATA:  Recent fall with hip pain, initial encounter EXAM: LEFT FEMUR 2 VIEWS COMPARISON:  None. FINDINGS: There is a fracture through the proximal shaft of the left femur just below the intratrochanteric region with medial displacement of the distal femoral shaft with respect to the proximal femur. Considerable angulation is noted as well. No other fracture is seen. IMPRESSION: Fracture through the  proximal left femur just below the intratrochanteric region with angulation and displacement of the distal femoral shaft. Electronically Signed   By: Inez Catalina M.D.   On: 10/18/2018 17:25     The results of significant diagnostics from this hospitalization (including imaging, microbiology, ancillary and laboratory) are listed below for reference.     Microbiology: Recent Results (from the past 240 hour(s))  SARS Coronavirus 2 (CEPHEID - Performed in [place] hospital lab), Hosp Order     Status: None   Collection Time: 10/18/18  5:12 PM   Specimen: Nasopharyngeal Swab  Result Value Ref Range Status   SARS Coronavirus 2 NEGATIVE NEGATIVE Final    Comment: (NOTE) If result is NEGATIVE SARS-CoV-2 target nucleic acids are NOT DETECTED. The SARS-CoV-2 RNA is generally detectable in upper and lower  respiratory specimens during the acute phase of infection. The lowest  concentration of SARS-CoV-2 viral copies this assay can detect is 250  copies / mL. A negative result does not preclude SARS-CoV-2 infection  and should not be used as the sole basis for treatment or other  patient management decisions.  A negative result may occur with  improper specimen collection / handling, submission of specimen other  than nasopharyngeal swab, presence of viral mutation(s) within the  areas targeted by this assay, and inadequate number of viral copies  (<250 copies / mL). A negative result must be combined with clinical  observations, patient history, and epidemiological information. If result is POSITIVE SARS-CoV-2 target nucleic acids are DETECTED. The SARS-CoV-2 RNA is generally detectable in upper and lower  respiratory specimens dur ing the acute phase of infection.  Positive  results are indicative of active infection with SARS-CoV-2.  Clinical  correlation with patient history and other diagnostic information is  necessary to determine patient infection status.  Positive results do  not  rule out bacterial infection or co-infection with other viruses. If result is PRESUMPTIVE POSTIVE SARS-CoV-2 nucleic acids MAY BE PRESENT.   A presumptive positive result was obtained on the submitted specimen  and confirmed on repeat testing.  While 2019 novel coronavirus  (SARS-CoV-2) nucleic acids may be present in the submitted sample  additional confirmatory testing may be necessary for epidemiological  and / or clinical management purposes  to differentiate between  SARS-CoV-2 and other Sarbecovirus currently known to infect humans.  If clinically indicated additional testing with an alternate test  methodology [id number]) is advised. The SARS-CoV-2 RNA is generally  detectable in upper and lower respiratory sp ecimens  during the acute  phase of infection. The expected result is Negative. Fact Sheet for Patients:  StrictlyIdeas.no Fact Sheet for Healthcare Providers: BankingDealers.co.za This test is not yet approved or cleared by the [place] FDA and has been authorized for detection and/or diagnosis of SARS-CoV-2 by FDA under an Emergency Use Authorization (EUA).  This EUA will remain in effect (meaning this test can be used) for the duration of the COVID-19 declaration under Section 564(b)(1) of the Act, 21 U.S.C. section 360bbb-3(b)(1), unless the authorization is terminated or revoked sooner. Performed at [facility], [place] [address] [place], [place] [address]   Surgical pcr screen     Status: None   Collection Time: 10/19/18  1:28 AM   Specimen: Nasal Mucosa; Nasal Swab  Result Value Ref Range Status   MRSA, PCR NEGATIVE NEGATIVE Final   Staphylococcus aureus NEGATIVE NEGATIVE Final    Comment: (NOTE) The Xpert SA Assay (FDA approved for NASAL specimens in patients 48 years of age and older), is one component of a comprehensive surveillance program. It is not intended to diagnose infection nor  to guide or monitor treatment. Performed at [facility], [place] [address], [place], [place] [address]      Labs: BNP (last 3 results) No results for input(s): BNP in the last 8760 hours. Basic Metabolic Panel: Recent Labs  Lab 10/20/18 0459 10/21/18 0318 10/22/18 0400 10/23/18 0415 10/24/18 0317  NA 135 140 139 140 138  K 3.6 3.0* 3.7 3.4* 3.5  CL 103 109 109 110 108  CO2 _0 GLUCOSE 114* 120* 112* 106* 98  BUN _1 7* 11  CREATININE 0.74 0.64 0.59 0.57 0.60  CALCIUM 7.7* 7.9* 7.8* 8.2* 8.3*  MG 2.2 2.2 1.9 1.9 2.0   Liver Function Tests: No results for input(s): AST, ALT, ALKPHOS, BILITOT, PROT, ALBUMIN in the last 168 hours. No results for input(s): LIPASE, AMYLASE in the last 168 hours. No results for input(s): AMMONIA in the last 168 hours. CBC: Recent Labs  Lab 10/18/18 1645 10/20/18 0459 10/21/18 0318 10/22/18 0400 10/23/18 0415  WBC 6.8 10.5 8.1 7.0 6.4  NEUTROABS 4.4  --   --   --  3.5  HGB 13.6 9.1* 8.0* 7.4* 8.8*  HCT 42.6 27.5* 24.0* 22.1* 26.3*  MCV 98.6 93.2 92.3 94.4 92.0  PLT 180 183 147* 180 207   Cardiac Enzymes: No results for input(s): CKTOTAL, CKMB, CKMBINDEX, TROPONINI in the last 168 hours. BNP: Invalid input(s): POCBNP CBG: No results for input(s): GLUCAP in the last 168 hours. D-Dimer No results for input(s): DDIMER in the last 72 hours. Hgb A1c No results for input(s): HGBA1C in the last 72 hours. Lipid Profile No results for input(s): CHOL, HDL, LDLCALC, TRIG, CHOLHDL, LDLDIRECT in the last 72 hours. Thyroid function studies No results for input(s): TSH, T4TOTAL, T3FREE, THYROIDAB in the last 72 hours.  Invalid input(s): FREET3 Anemia work up No results for input(s): VITAMINB12, FOLATE, FERRITIN, TIBC, IRON, RETICCTPCT in the last 72 hours. Urinalysis No results found for: COLORURINE, APPEARANCEUR, [place], [place], [place], [place], [place], [place], PROTEINUR, UROBILINOGEN, NITRITE,  LEUKOCYTESUR Sepsis Labs Invalid input(s): PROCALCITONIN,  WBC,  LACTICIDVEN Microbiology Recent Results (from the past 240 hour(s))  SARS Coronavirus 2 (CEPHEID - Performed in [place] hospital lab), Hosp Order     Status: None   Collection Time: 10/18/18  5:12 PM   Specimen: Nasopharyngeal Swab  Result Value Ref Range Status   SARS  Coronavirus 2 NEGATIVE NEGATIVE Final    Comment: (NOTE) If result is NEGATIVE SARS-CoV-2 target nucleic acids are NOT DETECTED. The SARS-CoV-2 RNA is generally detectable in upper and lower  respiratory specimens during the acute phase of infection. The lowest  concentration of SARS-CoV-2 viral copies this assay can detect is 250  copies / mL. A negative result does not preclude SARS-CoV-2 infection  and should not be used as the sole basis for treatment or other  patient management decisions.  A negative result may occur with  improper specimen collection / handling, submission of specimen other  than nasopharyngeal swab, presence of viral mutation(s) within the  areas targeted by this assay, and inadequate number of viral copies  (<250 copies / mL). A negative result must be combined with clinical  observations, patient history, and epidemiological information. If result is POSITIVE SARS-CoV-2 target nucleic acids are DETECTED. The SARS-CoV-2 RNA is generally detectable in upper and lower  respiratory specimens dur ing the acute phase of infection.  Positive  results are indicative of active infection with SARS-CoV-2.  Clinical  correlation with patient history and other diagnostic information is  necessary to determine patient infection status.  Positive results do  not rule out bacterial infection or co-infection with other viruses. If result is PRESUMPTIVE POSTIVE SARS-CoV-2 nucleic acids MAY BE PRESENT.   A presumptive positive result was obtained on the submitted specimen  and confirmed on repeat testing.  While 2019 novel coronavirus   (SARS-CoV-2) nucleic acids may be present in the submitted sample  additional confirmatory testing may be necessary for epidemiological  and / or clinical management purposes  to differentiate between  SARS-CoV-2 and other Sarbecovirus currently known to infect humans.  If clinically indicated additional testing with an alternate test  methodology [phone]) is advised. The SARS-CoV-2 RNA is generally  detectable in upper and lower respiratory sp ecimens during the acute  phase of infection. The expected result is Negative. Fact Sheet for Patients:  StrictlyIdeas.no Fact Sheet for Healthcare Providers: BankingDealers.co.za This test is not yet approved or cleared by the [place] FDA and has been authorized for detection and/or diagnosis of SARS-CoV-2 by FDA under an Emergency Use Authorization (EUA).  This EUA will remain in effect (meaning this test can be used) for the duration of the COVID-19 declaration under Section 564(b)(1) of the Act, 21 U.S.C. section 360bbb-3(b)(1), unless the authorization is terminated or revoked sooner. Performed at [facility], [place] [address] [place], [place] [address]   Surgical pcr screen     Status: None   Collection Time: 10/19/18  1:28 AM   Specimen: Nasal Mucosa; Nasal Swab  Result Value Ref Range Status   MRSA, PCR NEGATIVE NEGATIVE Final   Staphylococcus aureus NEGATIVE NEGATIVE Final    Comment: (NOTE) The Xpert SA Assay (FDA approved for NASAL specimens in patients 76 years of age and older), is one component of a comprehensive surveillance program. It is not intended to diagnose infection nor to guide or monitor treatment. Performed at [facility], [place] [address] [place], [place] [address]      Time coordinating discharge:  I have spent 35 minutes face to face with the patient and on the ward discussing the patients care, assessment, plan and disposition  with other care givers. >50% of the time was devoted counseling the patient about the risks and benefits of treatment/Discharge disposition and coordinating care.   SIGNED:   Nita Sells, MD  [facility] 10/24/2018,  8:29 AM   If 7PM-7AM, please contact night-coverage [email or web address]

## 2018-11-04 ENCOUNTER — Encounter: Payer: Self-pay | Admitting: Physician Assistant

## 2018-11-04 ENCOUNTER — Ambulatory Visit (INDEPENDENT_AMBULATORY_CARE_PROVIDER_SITE_OTHER): Payer: Medicare Other | Admitting: Physician Assistant

## 2018-11-04 ENCOUNTER — Ambulatory Visit (INDEPENDENT_AMBULATORY_CARE_PROVIDER_SITE_OTHER): Payer: Medicare Other

## 2018-11-04 DIAGNOSIS — S7222XD Displaced subtrochanteric fracture of left femur, subsequent encounter for closed fracture with routine healing: Secondary | ICD-10-CM

## 2018-11-04 NOTE — Progress Notes (Signed)
Post-Op Visit Note   [name]           Date of Birth: 06/05/1926           MRN: [id number] Visit Date: 11/04/2018 PCP: Harlan Stains, MD   Assessment & Plan:  Chief Complaint: No chief complaint on file.  Visit Diagnoses:  1. Closed displaced subtrochanteric fracture of left femur with routine healing, subsequent encounter     Plan: Patient is a pleasant 85 year old female who presents our clinic today 16 days status post left intramedullary nail from a subtrochanteric femur fracture, date of surgery 10/19/2018.  She has been doing well.  She has been residing at [facility] where she is working with physical therapy.  Prior to her injury, she was ambulating with a walker.  She is starting to ambulate with a walker during her physical therapy sessions.  She has minimal pain.  No fevers or chills.  Examination of her left hip reveals well-healing surgical incisions with staples in place.  Calves are soft and nontender.  She is neurovascular intact distally.  At this point, she will continue to advance with physical therapy.  She will follow-up with [place] in 4 weeks time for repeat evaluation and 4 view x-rays of the left femur.  Call with concerns or questions in the meantime.  Follow-Up Instructions: Return in about 4 weeks (around 12/02/2018).   Orders:  Orders Placed This Encounter  Procedures  . XR FEMUR MIN 2 VIEWS LEFT   No orders of the defined types were placed in this encounter.   Imaging: Xr Femur Min 2 Views Left  Result Date: 11/04/2018 X-rays demonstrate stable alignment of the fracture without interval change   PMFS History: Patient Active Problem List   Diagnosis Date Noted  . Fall at home, initial encounter 10/19/2018  . Closed left subtrochanteric femur fracture, initial encounter ([place]) 10/18/2018  . Closed left subtrochanteric femur fracture ([place]) 10/18/2018  . Ductal carcinoma in situ (DCIS) of left breast 12/27/2016  . Mastitis, left, acute  12/19/2012  . Malignant neoplasm of upper-outer quadrant of left breast in female, estrogen receptor positive ([place]) 09/15/2012  . Chest pain 05/28/2012  . Hypothyroidism 05/28/2012  . Hypertension 05/28/2012  . Hyperlipidemia 05/28/2012   Past Medical History:  Diagnosis Date  . Arthritis   . Breast cancer ([place])   . Hyperlipidemia   . Hypertension   . Osteoporosis   . Thyroid disease     Family History  Problem Relation Age of Onset  . Breast cancer Sister        dx in her 12s  . Cancer Maternal Grandmother        unknown form of cancer  . Heart attack Maternal Grandfather   . Cancer Cousin        maternal cousin with unknown form of cancer    Past Surgical History:  Procedure Laterality Date  . ABDOMINAL HYSTERECTOMY    . APPENDECTOMY    . BREAST LUMPECTOMY WITH NEEDLE LOCALIZATION Left 10/09/2012   Procedure: BREAST LUMPECTOMY WITH NEEDLE LOCALIZATION;  Surgeon: Merrie Roof, MD;  Location: [place];  Service: General;  Laterality: Left;  needle localization at SOLIS 7:30   . COLONOSCOPY    . fissures    . INTRAMEDULLARY (IM) NAIL INTERTROCHANTERIC Left 10/19/2018   Procedure: INTRAMEDULLARY (IM) NAIL INTERTROCHANTRIC;  Surgeon: Leandrew Koyanagi, MD;  Location: [place];  Service: Orthopedics;  Laterality: Left;  . MOUTH SURGERY  Social History   Occupational History  . Occupation: Retired Restaurant manager, fast food  Tobacco Use  . Smoking status: Never Smoker  . Smokeless tobacco: Never Used  Substance and Sexual Activity  . Alcohol use: No  . Drug use: No  . Sexual activity: Not Currently

## 2018-11-20 ENCOUNTER — Telehealth: Payer: Self-pay | Admitting: Orthopaedic Surgery

## 2018-11-20 NOTE — Telephone Encounter (Signed)
Call orders

## 2018-11-20 NOTE — Telephone Encounter (Signed)
Becky Montoya with [facility] called in requesting verbal orders for home health physical therapy for this pt for 1 time a week for 1 week and 2 times a week for 2 weeks.   [phone]

## 2018-11-21 NOTE — Telephone Encounter (Signed)
IC verbal given.  

## 2018-12-02 ENCOUNTER — Encounter: Payer: Self-pay | Admitting: Orthopaedic Surgery

## 2018-12-02 ENCOUNTER — Ambulatory Visit (INDEPENDENT_AMBULATORY_CARE_PROVIDER_SITE_OTHER): Payer: Medicare Other

## 2018-12-02 ENCOUNTER — Ambulatory Visit (INDEPENDENT_AMBULATORY_CARE_PROVIDER_SITE_OTHER): Payer: Medicare Other | Admitting: Orthopaedic Surgery

## 2018-12-02 DIAGNOSIS — S7222XD Displaced subtrochanteric fracture of left femur, subsequent encounter for closed fracture with routine healing: Secondary | ICD-10-CM

## 2018-12-02 NOTE — Progress Notes (Signed)
Post-Op Visit Note   [name]           Date of Birth: 09-Oct-1926           MRN: [id number] Visit Date: 12/02/2018 PCP: Harlan Stains, MD   Assessment & Plan:  Chief Complaint:  Chief Complaint  Patient presents with  . Left Leg - Follow-up   Visit Diagnoses:  1. Closed displaced subtrochanteric fracture of left femur with routine healing, subsequent encounter     Plan: [name] is 6 weeks status post intramedullary fixation of a pathologic left subtrochanteric femur fracture from Prolia injections.  She is doing well.  She denies any right hip or thigh pain.  Her left leg is doing significantly better.  She is ambulating with a Rollator.  She continues to do home health physical therapy.  Surgical scars are fully healed.  Her x-rays demonstrate stable alignment of fracture with bridging callus formation.  At this point I would like her to attend outpatient physical therapy for continued hip strengthening and rehab.  Recheck in 6 weeks with two-view x-rays of the left femur.  Questions encouraged and answered.  Follow-Up Instructions: Return in about 6 weeks (around 01/13/2019).   Orders:  Orders Placed This Encounter  Procedures  . XR FEMUR MIN 2 VIEWS LEFT   No orders of the defined types were placed in this encounter.   Imaging: Xr Femur Min 2 Views Left  Result Date: 12/02/2018 Stable alignment of left subtrochanteric femur fracture with bridging callus formation.   PMFS History: Patient Active Problem List   Diagnosis Date Noted  . Fall at home, initial encounter 10/19/2018  . Closed left subtrochanteric femur fracture, initial encounter ([place]) 10/18/2018  . Closed left subtrochanteric femur fracture ([place]) 10/18/2018  . Ductal carcinoma in situ (DCIS) of left breast 12/27/2016  . Mastitis, left, acute 12/19/2012  . Malignant neoplasm of upper-outer quadrant of left breast in female, estrogen receptor positive ([place]) 09/15/2012  . Chest pain 05/28/2012   . Hypothyroidism 05/28/2012  . Hypertension 05/28/2012  . Hyperlipidemia 05/28/2012   Past Medical History:  Diagnosis Date  . Arthritis   . Breast cancer ([place])   . Hyperlipidemia   . Hypertension   . Osteoporosis   . Thyroid disease     Family History  Problem Relation Age of Onset  . Breast cancer Sister        dx in her 61s  . Cancer Maternal Grandmother        unknown form of cancer  . Heart attack Maternal Grandfather   . Cancer Cousin        maternal cousin with unknown form of cancer    Past Surgical History:  Procedure Laterality Date  . ABDOMINAL HYSTERECTOMY    . APPENDECTOMY    . BREAST LUMPECTOMY WITH NEEDLE LOCALIZATION Left 10/09/2012   Procedure: BREAST LUMPECTOMY WITH NEEDLE LOCALIZATION;  Surgeon: Merrie Roof, MD;  Location: [place];  Service: General;  Laterality: Left;  needle localization at SOLIS 7:30   . COLONOSCOPY    . fissures    . INTRAMEDULLARY (IM) NAIL INTERTROCHANTERIC Left 10/19/2018   Procedure: INTRAMEDULLARY (IM) NAIL INTERTROCHANTRIC;  Surgeon: Leandrew Koyanagi, MD;  Location: [place];  Service: Orthopedics;  Laterality: Left;  . MOUTH SURGERY     Social History   Occupational History  . Occupation: Retired Restaurant manager, fast food  Tobacco Use  . Smoking status: Never Smoker  . Smokeless tobacco: Never Used  Substance  and Sexual Activity  . Alcohol use: No  . Drug use: No  . Sexual activity: Not Currently

## 2018-12-16 ENCOUNTER — Telehealth: Payer: Self-pay | Admitting: Orthopaedic Surgery

## 2018-12-16 NOTE — Telephone Encounter (Signed)
I left voicemail requesting return call. 

## 2018-12-16 NOTE — Telephone Encounter (Signed)
Patient called. Would like to talk about her meds. Call back number is [phone]. Thanks.

## 2018-12-16 NOTE — Telephone Encounter (Signed)
Does not need refill from our standpoint

## 2018-12-16 NOTE — Telephone Encounter (Signed)
I spoke with patient. She states that Dr. Erlinda Hong prescribed ferrous sulfate and Xarelto for her when she left the hospital and that the physician at the rehab facility sent her home with a rx for both medications. She is now out of the medicines and needs to know if you would like for her to continue the medications. If you do, she needs refills sent to [facility] at [address] as soon as possible.  Please advise.

## 2018-12-16 NOTE — Telephone Encounter (Signed)
I called patient and advised. 

## 2019-01-13 ENCOUNTER — Ambulatory Visit (INDEPENDENT_AMBULATORY_CARE_PROVIDER_SITE_OTHER): Payer: Medicare Other

## 2019-01-13 ENCOUNTER — Ambulatory Visit (INDEPENDENT_AMBULATORY_CARE_PROVIDER_SITE_OTHER): Payer: Medicare Other | Admitting: Physician Assistant

## 2019-01-13 ENCOUNTER — Encounter: Payer: Self-pay | Admitting: Orthopaedic Surgery

## 2019-01-13 DIAGNOSIS — S7222XD Displaced subtrochanteric fracture of left femur, subsequent encounter for closed fracture with routine healing: Secondary | ICD-10-CM | POA: Diagnosis not present

## 2019-01-13 NOTE — Progress Notes (Signed)
Post-Op Visit Note   [name]           Date of Birth: May 12, 1926           MRN: [id number] Visit Date: 01/13/2019 PCP: Harlan Stains, MD   Assessment & Plan:  Chief Complaint:  Chief Complaint  Patient presents with  . Left Hip - Pain, Follow-up   Visit Diagnoses:  1. Closed displaced subtrochanteric fracture of left femur with routine healing, subsequent encounter     Plan: Patient is a pleasant 85 year old female who presents our clinic today 86 days status post left hip intramedullary nail from a subtrochanteric femur fracture, date of surgery 10/19/2018.  She has been doing well.  She has minimal to no pain.  She has been in outpatient physical therapy working on regaining strength and balance.  She is still ambulating with a Rollator.  Examination of her left hip reveals near full range of motion.  She still lacks a little bit of strength.  Calf is soft nontender.  She is neurovascularly intact distally.  At this point, she will continue with physical therapy and wean to a home exercise program when ready.  She will follow-up with [place] in 6 weeks time for repeat evaluation and 2 view x-rays of the left femur.  Call with concerns or questions.  Follow-Up Instructions: Return in about 6 weeks (around 02/24/2019).   Orders:  Orders Placed This Encounter  Procedures  . XR FEMUR MIN 2 VIEWS LEFT   No orders of the defined types were placed in this encounter.   Imaging: No results found.  PMFS History: Patient Active Problem List   Diagnosis Date Noted  . Fall at home, initial encounter 10/19/2018  . Closed left subtrochanteric femur fracture, initial encounter ([place]) 10/18/2018  . Closed left subtrochanteric femur fracture ([place]) 10/18/2018  . Ductal carcinoma in situ (DCIS) of left breast 12/27/2016  . Mastitis, left, acute 12/19/2012  . Malignant neoplasm of upper-outer quadrant of left breast in female, estrogen receptor positive ([place]) 09/15/2012  . Chest  pain 05/28/2012  . Hypothyroidism 05/28/2012  . Hypertension 05/28/2012  . Hyperlipidemia 05/28/2012   Past Medical History:  Diagnosis Date  . Arthritis   . Breast cancer ([place])   . Hyperlipidemia   . Hypertension   . Osteoporosis   . Thyroid disease     Family History  Problem Relation Age of Onset  . Breast cancer Sister        dx in her 53s  . Cancer Maternal Grandmother        unknown form of cancer  . Heart attack Maternal Grandfather   . Cancer Cousin        maternal cousin with unknown form of cancer    Past Surgical History:  Procedure Laterality Date  . ABDOMINAL HYSTERECTOMY    . APPENDECTOMY    . BREAST LUMPECTOMY WITH NEEDLE LOCALIZATION Left 10/09/2012   Procedure: BREAST LUMPECTOMY WITH NEEDLE LOCALIZATION;  Surgeon: Merrie Roof, MD;  Location: [place];  Service: General;  Laterality: Left;  needle localization at SOLIS 7:30   . COLONOSCOPY    . fissures    . INTRAMEDULLARY (IM) NAIL INTERTROCHANTERIC Left 10/19/2018   Procedure: INTRAMEDULLARY (IM) NAIL INTERTROCHANTRIC;  Surgeon: Leandrew Koyanagi, MD;  Location: [place];  Service: Orthopedics;  Laterality: Left;  . MOUTH SURGERY     Social History   Occupational History  . Occupation: Retired Restaurant manager, fast food  Tobacco Use  .  Smoking status: Never Smoker  . Smokeless tobacco: Never Used  Substance and Sexual Activity  . Alcohol use: No  . Drug use: No  . Sexual activity: Not Currently

## 2019-02-24 ENCOUNTER — Ambulatory Visit (INDEPENDENT_AMBULATORY_CARE_PROVIDER_SITE_OTHER): Payer: Medicare Other | Admitting: Orthopaedic Surgery

## 2019-02-24 ENCOUNTER — Other Ambulatory Visit: Payer: Self-pay

## 2019-02-24 ENCOUNTER — Ambulatory Visit: Payer: Self-pay

## 2019-02-24 DIAGNOSIS — S7222XA Displaced subtrochanteric fracture of left femur, initial encounter for closed fracture: Secondary | ICD-10-CM | POA: Diagnosis not present

## 2019-02-24 MED ORDER — CALCIUM CARBONATE-VITAMIN D 500-200 MG-UNIT PO TABS: 1.0000 | ORAL_TABLET | Freq: Three times a day (TID) | ORAL | 6 refills | Status: DC

## 2019-02-24 NOTE — Progress Notes (Signed)
Office Visit Note   [name]           Date of Birth: January 10, 1927           MRN: [id number] Visit Date: 02/24/2019              Requested by: Harlan Stains, MD [place],  [place] [address] PCP: Harlan Stains, MD   Assessment & Plan: Visit Diagnoses:  1. Closed left subtrochanteric femur fracture, initial encounter [facility])     Plan: The x-rays were reviewed with the patient today.  Her fracture is healing slowly with a persistent fracture lucency therefore we will need to order a bone stimulator to help facilitate fracture healing.  In the meantime she will continue with home exercises.  We have sent in prescription for Os-Cal.  Follow-up in 3 months with 2 view x-rays of the left femur.  Follow-Up Instructions: Return in about 2 months (around 04/26/2019).   Orders:  Orders Placed This Encounter  Procedures  . XR FEMUR MIN 2 VIEWS LEFT   Meds ordered this encounter  Medications  . calcium-vitamin D (OSCAL WITH D) 500-200 MG-UNIT tablet    Sig: Take 1 tablet by mouth 3 (three) times daily.    Dispense:  90 tablet    Refill:  6      Procedures: No procedures performed   Clinical Data: No additional findings.   Subjective: Chief Complaint  Patient presents with  . Left Leg - Routine Post Op    [place] is 4 months status post intramedullary repair of left subtrochanteric femur fracture from bisphosphonate use.  She is overall doing well but she still endorses some mild pain.  She is ambulating with a walker.  Her range of motion and strength have improved.   Review of Systems  Constitutional: Negative.   HENT: Negative.   Eyes: Negative.   Respiratory: Negative.   Cardiovascular: Negative.   Endocrine: Negative.   Musculoskeletal: Negative.   Neurological: Negative.   Hematological: Negative.   Psychiatric/Behavioral: Negative.   All other systems reviewed and are negative.    Objective: Vital Signs: There were no  vitals taken for this visit.  Physical Exam Vitals signs and nursing note reviewed.  Constitutional:      Appearance: She is well-developed.  Pulmonary:     Effort: Pulmonary effort is normal.  Skin:    General: Skin is warm.     Capillary Refill: Capillary refill takes less than 2 seconds.  Neurological:     Mental Status: She is alert and oriented to person, place, and time.  Psychiatric:        Behavior: Behavior normal.        Thought Content: Thought content normal.        Judgment: Judgment normal.     Ortho Exam Left hip exam shows fully healed surgical scars.  She has no swelling or tenderness palpation. Specialty Comments:  No specialty comments available.  Imaging: Xr Femur Min 2 Views Left  Result Date: 02/24/2019 Increase in callus formation of the pathologic fracture.  Hardware stable.  There is persistent fracture lucency.      PMFS History: Patient Active Problem List   Diagnosis Date Noted  . Fall at home, initial encounter 10/19/2018  . Closed left subtrochanteric femur fracture, initial encounter ([place]) 10/18/2018  . Closed left subtrochanteric femur fracture ([place]) 10/18/2018  . Ductal carcinoma in situ (DCIS) of left breast 12/27/2016  . Mastitis, left,  acute 12/19/2012  . Malignant neoplasm of upper-outer quadrant of left breast in female, estrogen receptor positive ([place]) 09/15/2012  . Chest pain 05/28/2012  . Hypothyroidism 05/28/2012  . Hypertension 05/28/2012  . Hyperlipidemia 05/28/2012   Past Medical History:  Diagnosis Date  . Arthritis   . Breast cancer ([place])   . Hyperlipidemia   . Hypertension   . Osteoporosis   . Thyroid disease     Family History  Problem Relation Age of Onset  . Breast cancer Sister        dx in her 98s  . Cancer Maternal Grandmother        unknown form of cancer  . Heart attack Maternal Grandfather   . Cancer Cousin        maternal cousin with unknown form of cancer    Past Surgical History:  Procedure  Laterality Date  . ABDOMINAL HYSTERECTOMY    . APPENDECTOMY    . BREAST LUMPECTOMY WITH NEEDLE LOCALIZATION Left 10/09/2012   Procedure: BREAST LUMPECTOMY WITH NEEDLE LOCALIZATION;  Surgeon: Merrie Roof, MD;  Location: [place];  Service: General;  Laterality: Left;  needle localization at SOLIS 7:30   . COLONOSCOPY    . fissures    . INTRAMEDULLARY (IM) NAIL INTERTROCHANTERIC Left 10/19/2018   Procedure: INTRAMEDULLARY (IM) NAIL INTERTROCHANTRIC;  Surgeon: Leandrew Koyanagi, MD;  Location: [place];  Service: Orthopedics;  Laterality: Left;  . MOUTH SURGERY     Social History   Occupational History  . Occupation: Retired Restaurant manager, fast food  Tobacco Use  . Smoking status: Never Smoker  . Smokeless tobacco: Never Used  Substance and Sexual Activity  . Alcohol use: No  . Drug use: No  . Sexual activity: Not Currently

## 2019-04-28 ENCOUNTER — Other Ambulatory Visit: Payer: Self-pay

## 2019-04-28 ENCOUNTER — Ambulatory Visit (INDEPENDENT_AMBULATORY_CARE_PROVIDER_SITE_OTHER): Payer: Medicare PPO

## 2019-04-28 ENCOUNTER — Encounter: Payer: Self-pay | Admitting: Orthopaedic Surgery

## 2019-04-28 ENCOUNTER — Ambulatory Visit (INDEPENDENT_AMBULATORY_CARE_PROVIDER_SITE_OTHER): Payer: Medicare PPO | Admitting: Orthopaedic Surgery

## 2019-04-28 DIAGNOSIS — S7222XA Displaced subtrochanteric fracture of left femur, initial encounter for closed fracture: Secondary | ICD-10-CM | POA: Diagnosis not present

## 2019-04-28 NOTE — Progress Notes (Signed)
Post-Op Visit Note   [name]           Date of Birth: 10-02-1926           MRN: [id number] Visit Date: 04/28/2019 PCP: Harlan Stains, MD   Assessment & Plan:  Chief Complaint:  Chief Complaint  Patient presents with  . Left Hip - Pain   Visit Diagnoses:  1. Closed left subtrochanteric femur fracture, initial encounter [facility])     Plan: Patient is a pleasant 86 year old female who comes in today 6 months out left hip intramedullary nail from a pathologic subtrochanteric femur fracture, date of surgery 10/19/2018.  She has been doing very well.  She has been using her bone stimulator for about 7 weeks now.  She denies any pain.  She does have some numbness at times to the left calf.  No pain however.  Examination of her left lower extremity reveals good range of motion and strength.  Calf is soft and nontender.  She is neurovascular intact distally.  At this point, she will continue with activity as tolerated.  She may discontinue her bone stimulator as her fracture is fully healed.  She will follow up with [place] as needed.  Follow-Up Instructions: Return if symptoms worsen or fail to improve.   Orders:  Orders Placed This Encounter  Procedures  . XR FEMUR MIN 2 VIEWS LEFT   No orders of the defined types were placed in this encounter.   Imaging: XR FEMUR MIN 2 VIEWS LEFT  Result Date: 04/28/2019 X-rays demonstrate a fully healed fracture without hardware complication   PMFS History: Patient Active Problem List   Diagnosis Date Noted  . Fall at home, initial encounter 10/19/2018  . Closed left subtrochanteric femur fracture, initial encounter ([place]) 10/18/2018  . Closed left subtrochanteric femur fracture ([place]) 10/18/2018  . Ductal carcinoma in situ (DCIS) of left breast 12/27/2016  . Mastitis, left, acute 12/19/2012  . Malignant neoplasm of upper-outer quadrant of left breast in female, estrogen receptor positive ([place]) 09/15/2012  . Chest pain 05/28/2012  .  Hypothyroidism 05/28/2012  . Hypertension 05/28/2012  . Hyperlipidemia 05/28/2012   Past Medical History:  Diagnosis Date  . Arthritis   . Breast cancer ([place])   . Hyperlipidemia   . Hypertension   . Osteoporosis   . Thyroid disease     Family History  Problem Relation Age of Onset  . Breast cancer Sister        dx in her 52s  . Cancer Maternal Grandmother        unknown form of cancer  . Heart attack Maternal Grandfather   . Cancer Cousin        maternal cousin with unknown form of cancer    Past Surgical History:  Procedure Laterality Date  . ABDOMINAL HYSTERECTOMY    . APPENDECTOMY    . BREAST LUMPECTOMY WITH NEEDLE LOCALIZATION Left 10/09/2012   Procedure: BREAST LUMPECTOMY WITH NEEDLE LOCALIZATION;  Surgeon: Merrie Roof, MD;  Location: [place];  Service: General;  Laterality: Left;  needle localization at SOLIS 7:30   . COLONOSCOPY    . fissures    . INTRAMEDULLARY (IM) NAIL INTERTROCHANTERIC Left 10/19/2018   Procedure: INTRAMEDULLARY (IM) NAIL INTERTROCHANTRIC;  Surgeon: Leandrew Koyanagi, MD;  Location: [place];  Service: Orthopedics;  Laterality: Left;  . MOUTH SURGERY     Social History   Occupational History  . Occupation: Retired Restaurant manager, fast food  Tobacco Use  .  Smoking status: Never Smoker  . Smokeless tobacco: Never Used  Substance and Sexual Activity  . Alcohol use: No  . Drug use: No  . Sexual activity: Not Currently

## 2019-07-31 ENCOUNTER — Other Ambulatory Visit: Payer: Self-pay

## 2019-07-31 ENCOUNTER — Encounter: Payer: Self-pay | Admitting: Internal Medicine

## 2019-07-31 ENCOUNTER — Ambulatory Visit (INDEPENDENT_AMBULATORY_CARE_PROVIDER_SITE_OTHER): Payer: Medicare PPO | Admitting: Internal Medicine

## 2019-07-31 VITALS — BP 120/76 | HR 88 | Ht 62.25 in | Wt 131.0 lb

## 2019-07-31 DIAGNOSIS — M810 Age-related osteoporosis without current pathological fracture: Secondary | ICD-10-CM | POA: Diagnosis not present

## 2019-07-31 LAB — BASIC METABOLIC PANEL
BUN: 19 mg/dL (ref 6–23)
CO2: 28 mEq/L (ref 19–32)
Calcium: 9.5 mg/dL (ref 8.4–10.5)
Chloride: 101 mEq/L (ref 96–112)
Creatinine, Ser: 0.71 mg/dL (ref 0.40–1.20)
GFR: 77.19 mL/min (ref >60.00)
Glucose, Bld: 86 mg/dL (ref 70–99)
Potassium: 4.7 mEq/L (ref 3.5–5.1)
Sodium: 136 mEq/L (ref 135–145)

## 2019-07-31 LAB — VITAMIN D 25 HYDROXY (VIT D DEFICIENCY, FRACTURES): VITD: 91.62 ng/mL (ref 30.00–100.00)

## 2019-07-31 NOTE — Progress Notes (Signed)
Patient ID: Becky Montoya, female   DOB: [birth year], 86 y.o.   MRN: [id number]   This visit occurred during the SARS-CoV-2 public health emergency.  Safety protocols were in place, including screening questions prior to the visit, additional usage of staff PPE, and extensive cleaning of exam room while observing appropriate contact time as indicated for disinfecting solutions.   HPI  Becky Montoya is a 86 y.o.-year-old female, referred by her PCP, Dr. Dema Severin, for management of clinical osteoporosis (OP).  Pt was dx with clinical OP in 2018, when a FRAX score returned high, rendering at higher risk for hip fracture over the next 10 years.  She sees orthopedics (Dr. Erlinda Hong). She has a history of left subtrochanteric femoral fracture is in 10/2018, after she slipped and fell in her kitchen. At that time, she had surgery with placement of an intramedullary rod. Of note, she was on Prolia at the time of the fracture. She was advised by Dr. Erlinda Hong to stop Prolia for 3 months at that time.  She she also had a right femoral stress fracture in 12/2010. She was standing and her leg gave up - husband caught her. No intervention needed at that time.   I reviewed pt's DXA scan reports: Date L1-L4 T score FN T score 33% distal Radius  ultra distal radius FRAX  11/15/2016 ([place] [place] [facility])  +0.4 (+10.4%*) RFN: -1.7 LFN: -1.0  -2.4  -2.3 MOF: 12.4% 10-year hip fracture risk: 3.9%  09/17/2013  -0.6 RFN: -1.6 LFN: n/a n/a n/a n/a  09/20/2015  -0.9 RFN: -1.9 LFN: n/a n/a n/a n/a  01/22/2008  -1.2 RFN: -1.9 LFN: n/a n/a n/a n/a  03/16/2004  -1.6 RFN: -2.3 LFN: n/a n/a n/a n/a  10/07/2001  -2.1 RFN: -2.4 LFN: n/a n/a n/a n/a   No dizziness/vertigo/orthostasis/poor vision.   Previous OP treatments:  - possibly Boniva - ?  She cannot remember exactly - Fosamax - x5 years several years before Prolia - Prolia - since 2016-17 >> was due 09/2018, but delayed >> fracture 10/2018 >> advised to stay off Prolia  No  h/o vitamin D deficiency. No available vit D levels: No results found for: VD25OH  Pt is on: - [place] calcium + vitamin D 500 mg -200 units - 3x a day - MVI - 1x a day  She has a history of spinal stenosis.  No weight bearing exercises, but does some chair and ball exercises as instructed by orthopedics.   She does not take high vitamin A doses.  No h/o 1 steroid course in the past  - Prednisone taper.  No steroid injections.  Menopause was at 86 y/o (surgical).   No FH of osteoporosis, but older sister with hip fx.  No h/o hypercalcemia or hyperparathyroidism. No h/o kidney stones. However, calcium was low in 10/2018. Previous levels normal: Lab Results  Component Value Date   CALCIUM 8.3 (L) 10/24/2018   CALCIUM 8.2 (L) 10/23/2018   CALCIUM 7.8 (L) 10/22/2018   CALCIUM 7.9 (L) 10/21/2018   CALCIUM 7.7 (L) 10/20/2018   CALCIUM 8.7 (L) 10/19/2018   CALCIUM 9.2 10/18/2018   CALCIUM 9.3 12/23/2014   CALCIUM 9.1 12/24/2013   CALCIUM 9.8 12/31/2012   No h/o thyrotoxicosis. She has mild hypothyroidism, on LT4. Reviewed TSH recent levels:  Lab Results  Component Value Date   TSH 1.183 05/28/2012   No h/o CKD. Last BUN/Cr: Lab Results  Component Value Date   BUN 11 10/24/2018   CREATININE 0.60  10/24/2018   She has a h/o BrCA - s/p Tamoxifen x 5 years - 2019. No RxTx or ChTx.  She also has a history of HTN and HL.  ROS: Constitutional: no weight gain, no weight loss, no fatigue, no subjective hyperthermia, no subjective hypothermia, no nocturia Eyes: no blurry vision, no xerophthalmia ENT: no sore throat, no nodules palpated in neck, no dysphagia, no odynophagia, no hoarseness, no tinnitus, no hypoacusis Cardiovascular: no CP, no SOB, no palpitations, + leg swelling Respiratory: no cough, no SOB, no wheezing Gastrointestinal: no N, no V, no D, no C, no acid reflux Musculoskeletal: + muscle, + joint aches Skin: no rash, no hair loss, + easy bruising Neurological:  no tremors, no numbness or tingling/no dizziness/no HAs Psychiatric: no depression, no anxiety + low libido  I reviewed pt's medications, allergies, PMH, social hx, family hx, and changes were documented in the history of present illness. Otherwise, unchanged from my initial visit note.  Past Medical History:  Diagnosis Date  . Arthritis   . Breast cancer ([place])   . Hyperlipidemia   . Hypertension   . Osteoporosis   . Thyroid disease    Past Surgical History:  Procedure Laterality Date  . ABDOMINAL HYSTERECTOMY    . APPENDECTOMY    . BREAST LUMPECTOMY WITH NEEDLE LOCALIZATION Left 10/09/2012   Procedure: BREAST LUMPECTOMY WITH NEEDLE LOCALIZATION;  Surgeon: Merrie Roof, MD;  Location: [facility];  Service: General;  Laterality: Left;  needle localization at SOLIS 7:30   . COLONOSCOPY    . fissures    . INTRAMEDULLARY (IM) NAIL INTERTROCHANTERIC Left 10/19/2018   Procedure: INTRAMEDULLARY (IM) NAIL INTERTROCHANTRIC;  Surgeon: Leandrew Koyanagi, MD;  Location: [facility];  Service: Orthopedics;  Laterality: Left;  . MOUTH SURGERY     Social History   Socioeconomic History  . Marital status: Married    Spouse name: Not on file  . Number of children: 3  . Years of education: Not on file  . Highest education level: Not on file  Occupational History  . Occupation: Retired school [employer]  Tobacco Use  . Smoking status: Never Smoker  . Smokeless tobacco: Never Used  Substance and Sexual Activity  . Alcohol use: No  . Drug use: No  . Sexual activity: Not Currently  Other Topics Concern  . Not on file  Social History Narrative   Lives with husband. Active with housework and church activities.   Social Determinants of Health   Financial Resource Strain:   . Difficulty of Paying Living Expenses:   Food Insecurity:   . Worried About Charity fundraiser in the Last Year:   . Arboriculturist in the Last Year:   Transportation Needs:   . Consulting civil engineer (Medical):   Marland Kitchen Lack of Transportation (Non-Medical):   Physical Activity:   . Days of Exercise per Week:   . Minutes of Exercise per Session:   Stress:   . Feeling of Stress :   Social Connections:   . Frequency of Communication with Friends and Family:   . Frequency of Social Gatherings with Friends and Family:   . Attends Religious Services:   . Active Member of Clubs or Organizations:   . Attends Archivist Meetings:   Marland Kitchen Marital Status:   Intimate Partner Violence:   . Fear of Current or Ex-Partner:   . Emotionally Abused:   Marland Kitchen Physically Abused:   . Sexually Abused:  Current Outpatient Medications on File Prior to Visit  Medication Sig Dispense Refill  . aspirin EC 81 MG tablet Take 81 mg by mouth every morning.    . calcium-vitamin D (OSCAL WITH D) 500-200 MG-UNIT tablet Take 1 tablet by mouth 3 (three) times daily. 90 tablet 6  . denosumab (PROLIA) 60 MG/ML SOLN injection Inject 60 mg into the skin every 6 (six) months. Administer in upper arm, thigh, or abdomen    . enoxaparin (LOVENOX) 40 MG/0.4ML injection Inject 0.4 mLs (40 mg total) into the skin daily. 0 mL   . ferrous sulfate 325 (65 FE) MG tablet Take 1 tablet (325 mg total) by mouth 2 (two) times daily with a meal. 60 tablet 0  . HYDROcodone-acetaminophen (NORCO) 7.5-325 MG tablet Take 1-2 tablets by mouth 3 (three) times daily as needed for moderate pain. 30 tablet 0  . levothyroxine (SYNTHROID, LEVOTHROID) 50 MCG tablet Take 50 mcg by mouth every morning.    Marland Kitchen losartan-hydrochlorothiazide (HYZAAR) 100-12.5 MG per tablet Take 1 tablet by mouth every morning.    . Magnesium 250 MG TABS Take 250 mg by mouth daily.    . Misc Natural Products (LUTEIN 20) CAPS Take 1 capsule by mouth every morning.    . Omega-3 Fatty Acids (OMEGA 3 PO) Take 1 capsule by mouth daily.    . polyethylene glycol (MIRALAX / GLYCOLAX) 17 g packet Take 17 g by mouth daily as needed for mild constipation. 14 each 0  .  senna-docusate (SENOKOT-S) 8.6-50 MG tablet Take 2 tablets by mouth at bedtime as needed for mild constipation or moderate constipation.    . simvastatin (ZOCOR) 20 MG tablet Take 20 mg by mouth every evening.     . vitamin E 400 UNIT capsule Take 400 Units by mouth every evening.     No current facility-administered medications on file prior to visit.   Allergies  Allergen Reactions  . Wellbutrin [Bupropion] Other (See Comments)    Nervousness   Family History  Problem Relation Age of Onset  . Breast cancer Sister        dx in her 11s  . Cancer Maternal Grandmother        unknown form of cancer  . Heart attack Maternal Grandfather   . Cancer Cousin        maternal cousin with unknown form of cancer   Also, HTN, HL, DM 2, heart disease in daughter. Thyroid disease in granddaughter  PE: BP 120/76   Pulse 88   Ht 5' 2.25" (1.581 m) Comment: measured today  Wt 131 lb (59.4 kg)   SpO2 99%   BMI 23.77 kg/m  Wt Readings from Last 3 Encounters:  07/31/19 131 lb (59.4 kg)  10/18/18 130 lb (59 kg)  01/16/18 128 lb 12.8 oz (58.4 kg)   Constitutional: Normal weight, in NAD.  No kyphosis.  Walks with a walker. Eyes: PERRLA, EOMI, no exophthalmos ENT: moist mucous membranes, no thyromegaly, no cervical lymphadenopathy Cardiovascular: RRR, No MRG, + periankle and lower leg edema, nonpitting Respiratory: CTA B Gastrointestinal: abdomen soft, NT, ND, BS+ Musculoskeletal: [place] nodule deformities on fingers, strength intact in all 4 Skin: moist, warm, no rashes Neurological: no tremor with outstretched hands, DTR normal in all 4  Assessment: 1. Osteoporosis  Plan: 1. Osteoporosis - likely postmenopausal/age-related, she likely has FH of OP (sister with hip fracture) - Discussed about increased risk of fracture, depending on the T score, greatly increased when the T score is lower than -  2.5, but it is actually a continuum and -2.5 should not be regarded as an absolute threshold.  We reviewed her DXA scan reports together, and I explained that based on the T scores and especially the FRAX scores, she has an increased risk for fractures. She did have a subtrochanteric femoral fracture (this was likely not an atypical fracture, since it happened after a fall).  She also had a femoral stress fracture in 2012.  She was not on Prolia then.   - After the last fracture, he was advised to stop Prolia for 3 months.  She is still off work Warden/ranger now.  At this visit, we discussed that there are several factors indicating that we should receive Prolia:  She had good improvement in her T-scores while on Prolia; in fact, her T-scores increased over time from 2003 onward  She developed a fracture 1 month after she was due for Prolia (she could not have the injection due to the coronavirus pandemic)  This was not an atypical fracture to indicate a side effect from Prolia - Therefore, after a discussion about benefits and possible side effects the osteoporosis medication classes including bisphosphonates, Prolia, and bone anabolic therapies with abaloparatide/teriparatide and her muscles amount, we decided to resume Prolia.  She tolerated this very well in the past.  She does not have dental work ongoing or in plan. - we reviewed her dietary and supplemental calcium and vitamin D intake.  We discussed about the recommendation to get 1200 mg of calcium daily preferentially from diet.  She is taking 1500 mg from supplements alone for now.  At today's visit I advised her to at least reduce it to 2 tablets of calcium daily (1000 mg) and I will check vit D today to see if she needs supplementation. - discussed fall precautions   - given handout from [facility]: weight bearing exercises - advised to do this every day or at least 5/7 days - we discussed about maintaining a good amount of protein in her diet. The recommended daily protein intake is ~0.8 g per kilogram per day (for  her, approximately 50 g).   I advised her to try to aim for this amount, since a diet low in proteins can exacerbate osteoporosis. I recommended a low acid diet and explained why increase blood acidity can promote osteoporosis. Also, avoid smoking or >2 drinks of alcohol a day.  - Will check the following labs today: Orders Placed This Encounter  Procedures  . Basic metabolic panel  . VITAMIN D 25 Hydroxy (Vit-D Deficiency, Fractures)  -At this visit, we  discussed that we also need a new bone density scan since the latest available is from 2018.  This needs to be checked on the same densitometer as her previous one so I advised her to get in touch with Dr. Dema Severin and see if she can order this and forwarded to me - if labs normal after the DXA scan report is back, will arrange for a Prolia inj - we can check a new DXA scan in 2 years after starting Prolia. DXA scan changes are secondary: unchanged or slightly higher T-scores are desirable - will see pt back in a year   Component     Latest Ref Rng & Units 07/31/2019  Sodium     135 - 145 mEq/L 136  Potassium     3.5 - 5.1 mEq/L 4.7  Chloride     96 - 112 mEq/L 101  CO2  19 - 32 mEq/L 28  Glucose     70 - 99 mg/dL 86  BUN     6 - 23 mg/dL 19  Creatinine     0.40 - 1.20 mg/dL 0.71  GFR     >60.00 mL/min 77.19  Calcium     8.4 - 10.5 mg/dL 9.5  VITD     30.00 - 100.00 ng/mL 91.62   Labs are normal, vitamin D is close to the upper limit of normal.  We will reduce her calcium + vitamin D supplement as discussed above. We will proceed with the preauthorization for Prolia.  Philemon Kingdom, MD PhD [facility] Endocrinology

## 2019-07-31 NOTE — Patient Instructions (Addendum)
Please stop at the lab.  Please reduce the Calcium-D to only 2x a day with meals.  Please call Dr. Dema Severin to order a new Bone Density.  Please come back for a follow-up appointment in 1 year.  How Can I Prevent Falls? Men and women with osteoporosis need to take care not to fall down. Falls can break bones. Some reasons people fall are: Poor vision  Poor balance  Certain diseases that affect how you walk  Some types of medicine, such as sleeping pills.  Some tips to help prevent falls outdoors are: Use a cane or walker  Wear rubber-soled shoes so you don't slip  Walk on grass when sidewalks are slippery  In winter, put salt or kitty litter on icy sidewalks.  Some ways to help prevent falls indoors are: Keep rooms free of clutter, especially on floors  Use plastic or carpet runners on slippery floors  Wear low-heeled shoes that provide good support  Do not walk in socks, stockings, or slippers  Be sure carpets and area rugs have skid-proof backs or are tacked to the floor  Be sure stairs are well lit and have rails on both sides  Put grab bars on bathroom walls near tub, shower, and toilet  Use a rubber bath mat in the shower or tub  Keep a flashlight next to your bed  Use a sturdy step stool with a handrail and wide steps  Add more lights in rooms (and night lights) Buy a cordless phone to keep with you so that you don't have to rush to the phone       when it rings and so that you can call for help if you fall.   (adapted from http://www.niams.NightlifePreviews.se)  Exercise for Strong Bones (from [place]) There are two types of exercises that are important for building and maintaining bone density:  weight-bearing and muscle-strengthening exercises. Weight-bearing Exercises These exercises include activities that make you move against gravity while staying upright. Weight-bearing exercises can be high-impact or  low-impact. High-impact weight-bearing exercises help build bones and keep them strong. If you have broken a bone due to osteoporosis or are at risk of breaking a bone, you may need to avoid high-impact exercises. If you're not sure, you should check with your healthcare provider. Examples of high-impact weight-bearing exercises are: . Dancing . Doing high-impact aerobics . Hiking . Jogging/running . Jumping Rope . Stair climbing . Tennis Low-impact weight-bearing exercises can also help keep bones strong and are a safe alternative if you cannot do high-impact exercises. Examples of low-impact weight-bearing exercises are: . Using elliptical training machines . Doing low-impact aerobics . Using stair-step machines . Fast walking on a treadmill or outside Muscle-Strengthening Exercises These exercises include activities where you move your body, a weight or some other resistance against gravity. They are also known as resistance exercises and include: . Lifting weights . Using elastic exercise bands . Using weight machines . Lifting your own body weight . Functional movements, such as standing and rising up on your toes Yoga and Pilates can also improve strength, balance and flexibility. However, certain positions may not be safe for people with osteoporosis or those at increased risk of broken bones. For example, exercises that have you bend forward may increase the chance of breaking a bone in the spine. A physical therapist should be able to help you learn which exercises are safe and appropriate for you. Non-Impact Exercises Non-impact exercises can help you to improve balance, posture  and how well you move in everyday activities. These exercises can also help to increase muscle strength and decrease the risk of falls and broken bones. Some of these exercises include: . Balance exercises that strengthen your legs and test your balance, such as Tai Chi, can decrease your risk of  falls. . Posture exercises that improve your posture and reduce rounded or "sloping" shoulders can help you decrease the chance of breaking a bone, especially in the spine. . Functional exercises that improve how well you move can help you with everyday activities and decrease your chance of falling and breaking a bone. For example, if you have trouble getting up from a chair or climbing stairs, you should do these activities as exercises. A physical therapist can teach you balance, posture and functional exercises. Starting a New Exercise Program If you haven't exercised regularly for a while, check with your healthcare provider before beginning a new exercise program--particularly if you have health problems such as heart disease, diabetes or high blood pressure. If you're at high risk of breaking a bone, you should work with a physical therapist to develop a safe exercise program. Once you have your healthcare provider's approval, start slowly. If you've already broken bones in the spine because of osteoporosis, be very careful to avoid activities that require reaching down, bending forward, rapid twisting motions, heavy lifting and those that increase your chance of a fall. As you get started, your muscles may feel sore for a day or two after you exercise. If soreness lasts longer, you may be working too hard and need to ease up. Exercises should be done in a pain-free range of motion. How Much Exercise Do You Need? Weight-bearing exercises 30 minutes on most days of the week. Do a 30-minutesession or multiple sessions spread out throughout the day. The benefits to your bones are the same.   Muscle-strengthening exercises Two to three days per week. If you don't have much time for strengthening/resistance training, do small amounts at a time. You can do just one body part each day. For example do arms one day, legs the next and trunk the next. You can also spread these exercises out during your normal  day.  Balance, posture and functional exercises Every day or as often as needed. You may want to focus on one area more than the others. If you have fallen or lose your balance, spend time doing balance exercises. If you are getting rounded shoulders, work more on posture exercises. If you have trouble climbing stairs or getting up from the couch, do more functional exercises. You can also perform these exercises at one time or spread them during your day. Work with a phyiscal therapist to learn the right exercises for you.

## 2019-08-03 NOTE — Progress Notes (Signed)
Result letter mailed to patient

## 2019-08-18 ENCOUNTER — Telehealth: Payer: Self-pay | Admitting: Internal Medicine

## 2019-08-18 DIAGNOSIS — D0512 Intraductal carcinoma in situ of left breast: Secondary | ICD-10-CM | POA: Diagnosis not present

## 2019-08-18 NOTE — Telephone Encounter (Signed)
-----   Message from Nile Riggs, [place] sent at 08/18/2019  9:46 AM EDT -----  ----- Message ----- From: Philemon Kingdom, MD Sent: 07/31/2019   5:14 PM EDT To: Nile Riggs, [place], Eleanora Neighbor, CMA  Ian Bushman, can you please call pt:  Labs are normal, vitamin D is close to the upper limit of normal.  We will reduce her calcium + vitamin D supplement as discussed at the time of the visit, from 3 times a day to only 2 times a day. Ty! C  Lattie Haw, Can you please submit her to the Prolia portal? Ty! C

## 2019-08-19 NOTE — Telephone Encounter (Signed)
Pt has been submitted to Prolia portal just awaiting SOB

## 2019-08-20 ENCOUNTER — Telehealth: Payer: Self-pay

## 2019-08-20 NOTE — Telephone Encounter (Signed)
Patient has been submitted to [employer] portal for Prolia. Summary of benefits has been received and it states that a PA is required, but after approved, patient only owes $40. PA will be initiated.

## 2019-08-21 DIAGNOSIS — M85851 Other specified disorders of bone density and structure, right thigh: Secondary | ICD-10-CM | POA: Diagnosis not present

## 2019-08-21 DIAGNOSIS — Z780 Asymptomatic menopausal state: Secondary | ICD-10-CM | POA: Diagnosis not present

## 2019-08-21 DIAGNOSIS — M810 Age-related osteoporosis without current pathological fracture: Secondary | ICD-10-CM | POA: Diagnosis not present

## 2019-08-24 NOTE — Telephone Encounter (Signed)
PA has been initiated via [email or web address] for Prolia injection.  Becky Montoya  Key: [id number]   PA Case ID: [id number] Status: Sent to Plan today Drug: Prolia 60MG /ML syringes Form: Nurse, adult and Medical Benefit PA Form

## 2019-08-24 NOTE — Telephone Encounter (Signed)
PA for Prolia has been approved.   Denia Stortz Key: [id number]   PA Case ID: [id number] Outcome: Approved today PA Case: [id number],  Status: Approved, Coverage Starts on: 08/24/2019 12:00:00 AM,  Coverage Ends on: 04/08/2020 12:00:00 AM. Questions? Contact [phone]. Drug: Prolia 60MG /ML syringes Form: Nurse, adult and Medical Benefit PA Form.  Called pt and left voicemail requesting a callback in order to schedule. Owes $40.

## 2019-08-28 ENCOUNTER — Ambulatory Visit: Payer: Medicare PPO

## 2019-08-28 ENCOUNTER — Other Ambulatory Visit: Payer: Self-pay

## 2019-08-28 DIAGNOSIS — M810 Age-related osteoporosis without current pathological fracture: Secondary | ICD-10-CM | POA: Diagnosis not present

## 2019-08-28 MED ORDER — DENOSUMAB 60 MG/ML ~~LOC~~ SOSY
60.0000 mg | PREFILLED_SYRINGE | Freq: Once | SUBCUTANEOUS | Status: AC
  Administered 2019-08-28: 60 mg via SUBCUTANEOUS

## 2019-08-28 NOTE — Progress Notes (Signed)
Per orders of Dr. Gherghe injection of Prolia given today by Coden Franchi W . Patient tolerated injection well. 

## 2019-09-01 DIAGNOSIS — H26491 Other secondary cataract, right eye: Secondary | ICD-10-CM | POA: Diagnosis not present

## 2019-09-01 DIAGNOSIS — Z961 Presence of intraocular lens: Secondary | ICD-10-CM | POA: Diagnosis not present

## 2019-09-28 ENCOUNTER — Other Ambulatory Visit: Payer: Self-pay | Admitting: Orthopaedic Surgery

## 2019-10-01 DIAGNOSIS — H26491 Other secondary cataract, right eye: Secondary | ICD-10-CM | POA: Diagnosis not present

## 2019-11-06 ENCOUNTER — Ambulatory Visit: Payer: Medicare PPO | Admitting: Surgical

## 2019-11-06 ENCOUNTER — Ambulatory Visit: Payer: Self-pay

## 2019-11-06 ENCOUNTER — Telehealth: Payer: Self-pay | Admitting: Orthopaedic Surgery

## 2019-11-06 ENCOUNTER — Other Ambulatory Visit: Payer: Self-pay | Admitting: Surgical

## 2019-11-06 DIAGNOSIS — M84351A Stress fracture, right femur, initial encounter for fracture: Secondary | ICD-10-CM | POA: Diagnosis not present

## 2019-11-06 DIAGNOSIS — M25551 Pain in right hip: Secondary | ICD-10-CM | POA: Diagnosis not present

## 2019-11-06 NOTE — Telephone Encounter (Signed)
Patient called advised her right hip is hurting and wanted to know if Dr Erlinda Hong need her to come in for an X-ray. The number to contact patient is [phone]

## 2019-11-06 NOTE — Telephone Encounter (Signed)
Can you make her an appt to come see  Dr. Erlinda Hong Next available. Thank you.

## 2019-11-06 NOTE — Telephone Encounter (Signed)
yes

## 2019-11-06 NOTE — Telephone Encounter (Signed)
Called patient to schedule an appointment. Patient said she need to be seen today she's hurting pretty bad. Patient asked if another provider can see her. Call transferred to triage.  Thanks   Joycelyn Schmid

## 2019-11-06 NOTE — Telephone Encounter (Signed)
Appt today with Lurena Joiner

## 2019-11-07 ENCOUNTER — Other Ambulatory Visit: Payer: Self-pay

## 2019-11-07 ENCOUNTER — Ambulatory Visit (HOSPITAL_BASED_OUTPATIENT_CLINIC_OR_DEPARTMENT_OTHER)
Admission: RE | Admit: 2019-11-07 | Discharge: 2019-11-07 | Disposition: A | Discharge status: Home / Self Care | Payer: Medicare PPO | Source: Ambulatory Visit | Attending: Surgical | Admitting: Surgical

## 2019-11-07 DIAGNOSIS — M25551 Pain in right hip: Secondary | ICD-10-CM

## 2019-11-07 DIAGNOSIS — M79651 Pain in right thigh: Secondary | ICD-10-CM | POA: Diagnosis not present

## 2019-11-08 ENCOUNTER — Encounter: Payer: Self-pay | Admitting: Surgical

## 2019-11-08 NOTE — Progress Notes (Signed)
Office Visit Note   [name]           Date of Birth: [birth year]           MRN: [id number] Visit Date: 11/06/2019 Requested by: Harlan Stains, MD [place],  [place] [address] PCP: Harlan Stains, MD  Subjective: Chief Complaint  Patient presents with  . Right Hip - Pain    HPI: Becky Montoya is a 86 y.o. female who presents to the office complaining of right hip pain.  Patient notes that she began having right groin pain and right anterior thigh pain that began this morning while she was walking.  She had no difficulty the previous day.  She denies any radicular pain or numbness/tingling.  No specific injury she can point to or fall.  She has been taking Tylenol for the pain.  Pain is worse with ambulation and she has been ambulating with a walker around her house which she does not normally do.  She normally only ambulates with a walker when she is out.  She has a history of osteoporosis for which she is currently on Prolia.  She does have a history of taking Fosamax.  She has a history of a left hip fracture that required surgery by Dr. Erlinda Hong in July 2020.  She also has a history of right femur stress fracture that required a period of nonweightbearing and physical therapy rather than surgery..                ROS: All systems reviewed are negative as they relate to the chief complaint within the history of present illness.  Patient denies fevers or chills.  Assessment & Plan: Visit Diagnoses:  1. Stress reaction of shaft of femur, right, initial encounter   2. Pain in right hip     Plan: Patient is a 86 year old female who presents complaint of right groin pain with acute onset this morning.  She has history of right hip stress fracture that was treated nonoperatively with a period of nonweightbearing several years ago.  She also has a history of osteoporosis for which she has taken Fosamax in the past but is currently on Prolia.  She does not  take any blood thinners aside from an 81 mg aspirin.  Radiographs taken today show what looks to be a incomplete stress fracture versus remnant of her prior stress fracture.  The findings on these radiographs were compared with previous radiographs of the right hip after her last stress fracture and found to be more concerning in appearance today than they had previously.  With her new symptoms and her history, will obtain urgent MRI of the right femur for further evaluation.  Depending on the MRI results, will consider period of nonweightbearing versus operative fixation.  Discussed this with patient and her daughter.  Until we decide on a treatment course, encourage patient to be nonweightbearing.  Do not want the fracture to complete.  Patient given a prescription for a wheelchair to use in the meantime.  Patient agreed this plan.  Will call patient with results next week to discuss next steps.  Follow-Up Instructions: No follow-ups on file.   Orders:  Orders Placed This Encounter  Procedures  . MR [facility] RIGHT WO CONTRAST   No orders of the defined types were placed in this encounter.     Procedures: No procedures performed   Clinical Data: No additional findings.  Objective: Vital Signs: There were no  vitals taken for this visit.  Physical Exam:  Constitutional: Patient appears well-developed HEENT:  Head: Normocephalic Eyes:EOM are normal Neck: Normal range of motion Cardiovascular: Normal rate Pulmonary/chest: Effort normal Neurologic: Patient is alert Skin: Skin is warm Psychiatric: Patient has normal mood and affect  Ortho Exam: Orthopedic exam demonstrates mild pain with logroll of the right lower extremity that she localizes to the right groin and anterior thigh.  She does have moderate tenderness palpation throughout the anterior and lateral proximal femur.  Mild tenderness palpation over the trochanteric bursa.  Patient notes pain when she performs straight leg raise  that she localizes to the proximal thigh.  Specialty Comments:  No specialty comments available.  Imaging: No results found.   PMFS History: Patient Active Problem List   Diagnosis Date Noted  . Age-related osteoporosis without current pathological fracture 07/31/2019  . Fall at home, initial encounter 10/19/2018  . Closed left subtrochanteric femur fracture, initial encounter ([place]) 10/18/2018  . Closed left subtrochanteric femur fracture ([place]) 10/18/2018  . Ductal carcinoma in situ (DCIS) of left breast 12/27/2016  . Mastitis, left, acute 12/19/2012  . Malignant neoplasm of upper-outer quadrant of left breast in female, estrogen receptor positive ([place]) 09/15/2012  . Chest pain 05/28/2012  . Hypothyroidism 05/28/2012  . Hypertension 05/28/2012  . Hyperlipidemia 05/28/2012   Past Medical History:  Diagnosis Date  . Arthritis   . Breast cancer ([place])   . Hyperlipidemia   . Hypertension   . Osteoporosis   . Thyroid disease     Family History  Problem Relation Age of Onset  . Breast cancer Sister        dx in her 51s  . Cancer Maternal Grandmother        unknown form of cancer  . Heart attack Maternal Grandfather   . Cancer Cousin        maternal cousin with unknown form of cancer    Past Surgical History:  Procedure Laterality Date  . ABDOMINAL HYSTERECTOMY    . APPENDECTOMY    . BREAST LUMPECTOMY WITH NEEDLE LOCALIZATION Left 10/09/2012   Procedure: BREAST LUMPECTOMY WITH NEEDLE LOCALIZATION;  Surgeon: Merrie Roof, MD;  Location: [facility];  Service: General;  Laterality: Left;  needle localization at SOLIS 7:30   . COLONOSCOPY    . fissures    . INTRAMEDULLARY (IM) NAIL INTERTROCHANTERIC Left 10/19/2018   Procedure: INTRAMEDULLARY (IM) NAIL INTERTROCHANTRIC;  Surgeon: Leandrew Koyanagi, MD;  Location: [facility];  Service: Orthopedics;  Laterality: Left;  . MOUTH SURGERY     Social History   Occupational History  . Occupation: Retired Restaurant manager, fast food    Tobacco Use  . Smoking status: Never Smoker  . Smokeless tobacco: Never Used  Substance and Sexual Activity  . Alcohol use: No  . Drug use: No  . Sexual activity: Not Currently

## 2019-11-09 ENCOUNTER — Telehealth: Payer: Self-pay | Admitting: Orthopedic Surgery

## 2019-11-09 NOTE — Progress Notes (Signed)
Here's that patient we spoke about.  Thanks for calling her

## 2019-11-09 NOTE — Progress Notes (Signed)
Dr. Earma Reading saw [place] this past Friday for severe right hip pain.  An MRI showed an early pathologic stress fracture of the right subtrochanteric area similar to the fracture she suffered on the left side.  I called her tonight to notify her that she needs to ambulate with either a wheelchair or walker and to stop taking the prolia.  She should abstain from any osteoclast inhibitors until the stress fracture is healed.  I will have her come back to see me in 2 weeks to get repeat xrays.  I just want you to be aware of this in case you needed to see her back in your office to discuss alternative medications.  Thank you.

## 2019-11-09 NOTE — Telephone Encounter (Signed)
See below. Patient requesting results.

## 2019-11-09 NOTE — Telephone Encounter (Signed)
[facility] calling with call report on MRI scan.  Please review in chart.

## 2019-11-09 NOTE — Telephone Encounter (Signed)
Patient called requesting MRI results be read over the phone. Patient doesn't have transportation and would rather have results read over the phone.Please call patient phone number is [phone].

## 2019-11-20 ENCOUNTER — Ambulatory Visit (INDEPENDENT_AMBULATORY_CARE_PROVIDER_SITE_OTHER): Payer: Medicare PPO

## 2019-11-20 ENCOUNTER — Ambulatory Visit: Payer: Medicare PPO | Admitting: Orthopaedic Surgery

## 2019-11-20 DIAGNOSIS — M84351A Stress fracture, right femur, initial encounter for fracture: Secondary | ICD-10-CM

## 2019-11-20 DIAGNOSIS — S7222XD Displaced subtrochanteric fracture of left femur, subsequent encounter for closed fracture with routine healing: Secondary | ICD-10-CM | POA: Diagnosis not present

## 2019-11-20 MED ORDER — PREDNISONE 10 MG (21) PO TBPK: ORAL_TABLET | ORAL | 0 refills | Status: DC

## 2019-11-20 NOTE — Progress Notes (Signed)
Office Visit Note   [name]           Date of Birth: [birth year]           MRN: [id number] Visit Date: 11/20/2019              Requested by: Harlan Stains, MD [place],  [place] [address] PCP: Harlan Stains, MD   Assessment & Plan: Visit Diagnoses:  1. Stress reaction of shaft of femur, right, initial encounter   2. Closed displaced subtrochanteric fracture of left femur with routine healing, subsequent encounter     Plan: Becky Montoya is doing well from my standpoint.  I would like to get her set up for a bone stimulator for the stress fracture.  She is having some back pain and she requested a prednisone Dosepak.  I would like to recheck her in 6 weeks with two-view x-rays of the right femur.  Follow-Up Instructions: Return in about 6 weeks (around 01/01/2020).   Orders:  Orders Placed This Encounter  Procedures  . XR FEMUR, MIN 2 VIEWS RIGHT   Meds ordered this encounter  Medications  . predniSONE (STERAPRED UNI-PAK 21 TAB) 10 MG (21) TBPK tablet    Sig: Take as directed    Dispense:  21 tablet    Refill:  0      Procedures: No procedures performed   Clinical Data: No additional findings.   Subjective: Chief Complaint  Patient presents with  . Right Leg - Follow-up    Becky Montoya is following up today for her new right subtrochanteric stress fracture from bisphosphonate use.  She is reporting no pain with ambulation with a walker.   Review of Systems   Objective: Vital Signs: There were no vitals taken for this visit.  Physical Exam  Ortho Exam She ambulates at a good pace with a Rollator without any pain to palpation or with weightbearing. Specialty Comments:  No specialty comments available.  Imaging: XR FEMUR, MIN 2 VIEWS RIGHT  Result Date: 11/20/2019 Thickening of the lateral femoral cortex in the subtrochanteric region.  There is a fracture line present.    PMFS History: Patient Active Problem List    Diagnosis Date Noted  . Stress reaction of shaft of femur, right, initial encounter 11/20/2019  . Age-related osteoporosis without current pathological fracture 07/31/2019  . Fall at home, initial encounter 10/19/2018  . Closed left subtrochanteric femur fracture, initial encounter ([place]) 10/18/2018  . Closed left subtrochanteric femur fracture ([place]) 10/18/2018  . Ductal carcinoma in situ (DCIS) of left breast 12/27/2016  . Mastitis, left, acute 12/19/2012  . Malignant neoplasm of upper-outer quadrant of left breast in female, estrogen receptor positive ([place]) 09/15/2012  . Chest pain 05/28/2012  . Hypothyroidism 05/28/2012  . Hypertension 05/28/2012  . Hyperlipidemia 05/28/2012   Past Medical History:  Diagnosis Date  . Arthritis   . Breast cancer ([place])   . Hyperlipidemia   . Hypertension   . Osteoporosis   . Thyroid disease     Family History  Problem Relation Age of Onset  . Breast cancer Sister        dx in her 80s  . Cancer Maternal Grandmother        unknown form of cancer  . Heart attack Maternal Grandfather   . Cancer Cousin        maternal cousin with unknown form of cancer    Past Surgical History:  Procedure Laterality Date  . ABDOMINAL HYSTERECTOMY    .  APPENDECTOMY    . BREAST LUMPECTOMY WITH NEEDLE LOCALIZATION Left 10/09/2012   Procedure: BREAST LUMPECTOMY WITH NEEDLE LOCALIZATION;  Surgeon: Merrie Roof, MD;  Location: [facility];  Service: General;  Laterality: Left;  needle localization at SOLIS 7:30   . COLONOSCOPY    . fissures    . INTRAMEDULLARY (IM) NAIL INTERTROCHANTERIC Left 10/19/2018   Procedure: INTRAMEDULLARY (IM) NAIL INTERTROCHANTRIC;  Surgeon: Leandrew Koyanagi, MD;  Location: [facility];  Service: Orthopedics;  Laterality: Left;  . MOUTH SURGERY     Social History   Occupational History  . Occupation: Retired Restaurant manager, fast food  Tobacco Use  . Smoking status: Never Smoker  . Smokeless tobacco: Never Used  Substance and Sexual  Activity  . Alcohol use: No  . Drug use: No  . Sexual activity: Not Currently

## 2019-12-10 DIAGNOSIS — Z1231 Encounter for screening mammogram for malignant neoplasm of breast: Secondary | ICD-10-CM | POA: Diagnosis not present

## 2019-12-31 DIAGNOSIS — M810 Age-related osteoporosis without current pathological fracture: Secondary | ICD-10-CM | POA: Diagnosis not present

## 2019-12-31 DIAGNOSIS — E785 Hyperlipidemia, unspecified: Secondary | ICD-10-CM | POA: Diagnosis not present

## 2019-12-31 DIAGNOSIS — Z23 Encounter for immunization: Secondary | ICD-10-CM | POA: Diagnosis not present

## 2019-12-31 DIAGNOSIS — Z0000 Encounter for general adult medical examination without abnormal findings: Secondary | ICD-10-CM | POA: Diagnosis not present

## 2019-12-31 DIAGNOSIS — E039 Hypothyroidism, unspecified: Secondary | ICD-10-CM | POA: Diagnosis not present

## 2019-12-31 DIAGNOSIS — I10 Essential (primary) hypertension: Secondary | ICD-10-CM | POA: Diagnosis not present

## 2020-01-01 ENCOUNTER — Ambulatory Visit: Payer: Medicare PPO | Admitting: Orthopaedic Surgery

## 2020-01-04 ENCOUNTER — Ambulatory Visit: Payer: Self-pay

## 2020-01-04 ENCOUNTER — Ambulatory Visit: Payer: Medicare PPO | Admitting: Orthopaedic Surgery

## 2020-01-04 DIAGNOSIS — M84351A Stress fracture, right femur, initial encounter for fracture: Secondary | ICD-10-CM

## 2020-01-04 NOTE — Progress Notes (Signed)
Office Visit Note   [name]           Date of Birth: Jul 20, 1926           MRN: [id number] Visit Date: 01/04/2020              Requested by: Harlan Stains, MD [address] [place],  [place] [address] PCP: Harlan Stains, MD   Assessment & Plan: Visit Diagnoses:  1. Stress reaction of shaft of femur, right, initial encounter     Plan: Impression is approximately 2 months out right hip subtrochanteric stress fracture.  She is exhibiting slight bony consolidation on today's x-rays.  I would like for her to continue using her bone stimulator for another 6 weeks.  She will follow-up with [place] at that point in time for 2 view x-rays of the right femur.  Call with concerns or questions in the meantime.  Follow-Up Instructions: Return in about 6 weeks (around 02/15/2020).   Orders:  Orders Placed This Encounter  Procedures  . XR FEMUR, MIN 2 VIEWS RIGHT   No orders of the defined types were placed in this encounter.     Procedures: No procedures performed   Clinical Data: No additional findings.   Subjective: Chief Complaint  Patient presents with  . Left Hip - Follow-up, Pain    HPI patient is a pleasant 86 year old female who comes in today for follow-up of her right hip subtrochanteric stress fracture from bisphosphonate use.  She is about 2 months out from this.  She only exhibits very slight pain and on occasion which is primarily with what sounds like hip abduction and internal rotation.  Otherwise, no complaints.  She has been using a bone stimulator 3 hours a day for the past 6 weeks.     Objective: Vital Signs: There were no vitals taken for this visit.    Ortho Exam examination of the right hip reveals a negative logroll negative FADIR.  No pain with hip abduction, abduction or forward flexion.  She is neurovascularly intact distally.  Specialty Comments:  No specialty comments available.  Imaging: XR FEMUR, MIN 2 VIEWS  RIGHT  Result Date: 01/04/2020 X-rays demonstrate persistent fracture lucency through the subtrochanteric lateral cortex.  She does have some bony consolidation compared to previous x-rays 6 weeks ago.    PMFS History: Patient Active Problem List   Diagnosis Date Noted  . Stress reaction of shaft of femur, right, initial encounter 11/20/2019  . Age-related osteoporosis without current pathological fracture 07/31/2019  . Fall at home, initial encounter 10/19/2018  . Closed left subtrochanteric femur fracture, initial encounter ([place]) 10/18/2018  . Closed left subtrochanteric femur fracture ([place]) 10/18/2018  . Ductal carcinoma in situ (DCIS) of left breast 12/27/2016  . Mastitis, left, acute 12/19/2012  . Malignant neoplasm of upper-outer quadrant of left breast in female, estrogen receptor positive ([place]) 09/15/2012  . Chest pain 05/28/2012  . Hypothyroidism 05/28/2012  . Hypertension 05/28/2012  . Hyperlipidemia 05/28/2012   Past Medical History:  Diagnosis Date  . Arthritis   . Breast cancer ([place])   . Hyperlipidemia   . Hypertension   . Osteoporosis   . Thyroid disease     Family History  Problem Relation Age of Onset  . Breast cancer Sister        dx in her 59s  . Cancer Maternal Grandmother        unknown form of cancer  . Heart attack Maternal Grandfather   .  Cancer Cousin        maternal cousin with unknown form of cancer    Past Surgical History:  Procedure Laterality Date  . ABDOMINAL HYSTERECTOMY    . APPENDECTOMY    . BREAST LUMPECTOMY WITH NEEDLE LOCALIZATION Left 10/09/2012   Procedure: BREAST LUMPECTOMY WITH NEEDLE LOCALIZATION;  Surgeon: Merrie Roof, MD;  Location: [facility];  Service: General;  Laterality: Left;  needle localization at SOLIS 7:30   . COLONOSCOPY    . fissures    . INTRAMEDULLARY (IM) NAIL INTERTROCHANTERIC Left 10/19/2018   Procedure: INTRAMEDULLARY (IM) NAIL INTERTROCHANTRIC;  Surgeon: Leandrew Koyanagi, MD;  Location: [facility];  Service: Orthopedics;  Laterality: Left;  . MOUTH SURGERY     Social History   Occupational History  . Occupation: Retired Restaurant manager, fast food  Tobacco Use  . Smoking status: Never Smoker  . Smokeless tobacco: Never Used  Substance and Sexual Activity  . Alcohol use: No  . Drug use: No  . Sexual activity: Not Currently

## 2020-01-05 DIAGNOSIS — M810 Age-related osteoporosis without current pathological fracture: Secondary | ICD-10-CM | POA: Diagnosis not present

## 2020-02-01 ENCOUNTER — Telehealth: Payer: Self-pay | Admitting: Orthopaedic Surgery

## 2020-02-01 NOTE — Telephone Encounter (Signed)
Spoke with patient.

## 2020-02-01 NOTE — Telephone Encounter (Signed)
Please call.

## 2020-02-01 NOTE — Telephone Encounter (Signed)
Pt called stating she needed to speak with Dr. Xu about a medicine he wanted her to stop taking; pt states she met with a different Dr. And she wanted to share what he told her with Dr. Xu  [phone] She only wants to speak with Dr.Xu 

## 2020-02-16 ENCOUNTER — Ambulatory Visit: Payer: Medicare PPO | Admitting: Orthopaedic Surgery

## 2020-02-16 ENCOUNTER — Encounter: Payer: Self-pay | Admitting: Orthopaedic Surgery

## 2020-02-16 ENCOUNTER — Ambulatory Visit (INDEPENDENT_AMBULATORY_CARE_PROVIDER_SITE_OTHER): Payer: Medicare PPO

## 2020-02-16 DIAGNOSIS — M84351A Stress fracture, right femur, initial encounter for fracture: Secondary | ICD-10-CM

## 2020-02-16 NOTE — Progress Notes (Signed)
Office Visit Note   [name]           Date of Birth: November 28, 1926           MRN: [id number] Visit Date: 02/16/2020              Requested by: Harlan Stains, MD [place] [place],  [place] [address] PCP: Harlan Stains, MD   Assessment & Plan: Visit Diagnoses:  1. Stress reaction of shaft of femur, right, initial encounter     Plan: Her fracture appears to be stable although I can still see the fracture line.  I like her to continue using the bone stimulator and the rolling walker in public and as needed based on symptoms.  I would like to recheck her in 2 months with two-view x-rays of the right hip.  Follow-Up Instructions: Return in about 2 months (around 04/17/2020).   Orders:  Orders Placed This Encounter  Procedures  . XR FEMUR, MIN 2 VIEWS RIGHT   No orders of the defined types were placed in this encounter.     Procedures: No procedures performed   Clinical Data: No additional findings.   Subjective: Chief Complaint  Patient presents with  . Right Hip - Pain, Follow-up    Becky Montoya is following up today for her right subtrochanteric stress fracture from bisphosphonate use.  She is ambulating with a rolling walker in public.  Has some pain at times but overall is doing well.   Review of Systems   Objective: Vital Signs: There were no vitals taken for this visit.  Physical Exam  Ortho Exam Right hip exam is essentially benign.  Normal gait with a rolling walker. Specialty Comments:  No specialty comments available.  Imaging: XR FEMUR, MIN 2 VIEWS RIGHT  Result Date: 02/16/2020 Stable subtrochanteric femur fracture.  There is a persistent fracture line.  There is abundant callus formation surrounding the fracture.    PMFS History: Patient Active Problem List   Diagnosis Date Noted  . Stress reaction of shaft of femur, right, initial encounter 11/20/2019  . Age-related osteoporosis without current pathological  fracture 07/31/2019  . Fall at home, initial encounter 10/19/2018  . Closed left subtrochanteric femur fracture, initial encounter ([place]) 10/18/2018  . Closed left subtrochanteric femur fracture ([place]) 10/18/2018  . Ductal carcinoma in situ (DCIS) of left breast 12/27/2016  . Mastitis, left, acute 12/19/2012  . Malignant neoplasm of upper-outer quadrant of left breast in female, estrogen receptor positive ([place]) 09/15/2012  . Chest pain 05/28/2012  . Hypothyroidism 05/28/2012  . Hypertension 05/28/2012  . Hyperlipidemia 05/28/2012   Past Medical History:  Diagnosis Date  . Arthritis   . Breast cancer ([place])   . Hyperlipidemia   . Hypertension   . Osteoporosis   . Thyroid disease     Family History  Problem Relation Age of Onset  . Breast cancer Sister        dx in her 62s  . Cancer Maternal Grandmother        unknown form of cancer  . Heart attack Maternal Grandfather   . Cancer Cousin        maternal cousin with unknown form of cancer    Past Surgical History:  Procedure Laterality Date  . ABDOMINAL HYSTERECTOMY    . APPENDECTOMY    . BREAST LUMPECTOMY WITH NEEDLE LOCALIZATION Left 10/09/2012   Procedure: BREAST LUMPECTOMY WITH NEEDLE LOCALIZATION;  Surgeon: Merrie Roof, MD;  Location: [facility];  Service: General;  Laterality: Left;  needle localization at SOLIS 7:30   . COLONOSCOPY    . fissures    . INTRAMEDULLARY (IM) NAIL INTERTROCHANTERIC Left 10/19/2018   Procedure: INTRAMEDULLARY (IM) NAIL INTERTROCHANTRIC;  Surgeon: Leandrew Koyanagi, MD;  Location: [facility];  Service: Orthopedics;  Laterality: Left;  . MOUTH SURGERY     Social History   Occupational History  . Occupation: Retired Restaurant manager, fast food  Tobacco Use  . Smoking status: Never Smoker  . Smokeless tobacco: Never Used  Substance and Sexual Activity  . Alcohol use: No  . Drug use: No  . Sexual activity: Not Currently

## 2020-03-14 DIAGNOSIS — Z20822 Contact with and (suspected) exposure to covid-19: Secondary | ICD-10-CM | POA: Diagnosis not present

## 2020-04-14 DIAGNOSIS — H524 Presbyopia: Secondary | ICD-10-CM | POA: Diagnosis not present

## 2020-04-14 DIAGNOSIS — H353131 Nonexudative age-related macular degeneration, bilateral, early dry stage: Secondary | ICD-10-CM | POA: Diagnosis not present

## 2020-04-14 DIAGNOSIS — H52203 Unspecified astigmatism, bilateral: Secondary | ICD-10-CM | POA: Diagnosis not present

## 2020-04-19 ENCOUNTER — Ambulatory Visit (INDEPENDENT_AMBULATORY_CARE_PROVIDER_SITE_OTHER): Payer: Medicare PPO

## 2020-04-19 ENCOUNTER — Ambulatory Visit: Payer: Medicare PPO | Admitting: Orthopaedic Surgery

## 2020-04-19 ENCOUNTER — Encounter: Payer: Self-pay | Admitting: Orthopaedic Surgery

## 2020-04-19 ENCOUNTER — Other Ambulatory Visit: Payer: Self-pay

## 2020-04-19 VITALS — BP 176/92 | HR 93

## 2020-04-19 DIAGNOSIS — M84351A Stress fracture, right femur, initial encounter for fracture: Secondary | ICD-10-CM

## 2020-04-19 NOTE — Progress Notes (Signed)
Office Visit Note   [name]           Date of Birth: 02-04-1927           MRN: [id number] Visit Date: 04/19/2020              Requested by: Harlan Stains, MD [address],  [place] [address] PCP: Harlan Stains, MD   Assessment & Plan: Visit Diagnoses:  1. Stress reaction of shaft of femur, right, initial encounter     Plan: Impression is clinically healed right hip subtrochanteric stress fracture.  At this point, she can discontinue the bone stimulator.  She may advance with activity as tolerated and follow-up with [place] as needed.  Follow-Up Instructions: Return if symptoms worsen or fail to improve.   Orders:  Orders Placed This Encounter  Procedures  . XR HIP UNILAT W OR W/O PELVIS 2-3 VIEWS RIGHT   No orders of the defined types were placed in this encounter.     Procedures: No procedures performed   Clinical Data: No additional findings.   Subjective: Chief Complaint  Patient presents with  . Right Hip - Pain    HPI patient is a pleasant 87 year old female who comes in today approximately 5-1/2 6 months out right subtrochanteric femur stress fracture.  She has been doing well.  She is ambulating with a Rollator.  Very intermittent and occasional pain to the right hip.  Her bone stimulator recently died to she is currently not using it.  Overall, doing very well.     Objective: Vital Signs: BP (!) 176/92   Pulse 93     Ortho Exam right hip exam shows full range of motion without pain.  She is neurovascular intact distally.  Specialty Comments:  No specialty comments available.  Imaging: XR HIP UNILAT W OR W/O PELVIS 2-3 VIEWS RIGHT  Result Date: 04/19/2020 Healed subtrochanteric stress fracture.    PMFS History: Patient Active Problem List   Diagnosis Date Noted  . Stress reaction of shaft of femur, right, initial encounter 11/20/2019  . Age-related osteoporosis without current pathological fracture  07/31/2019  . Fall at home, initial encounter 10/19/2018  . Closed left subtrochanteric femur fracture, initial encounter ([place]) 10/18/2018  . Closed left subtrochanteric femur fracture ([place]) 10/18/2018  . Ductal carcinoma in situ (DCIS) of left breast 12/27/2016  . Mastitis, left, acute 12/19/2012  . Malignant neoplasm of upper-outer quadrant of left breast in female, estrogen receptor positive ([place]) 09/15/2012  . Chest pain 05/28/2012  . Hypothyroidism 05/28/2012  . Hypertension 05/28/2012  . Hyperlipidemia 05/28/2012   Past Medical History:  Diagnosis Date  . Arthritis   . Breast cancer ([place])   . Hyperlipidemia   . Hypertension   . Osteoporosis   . Thyroid disease     Family History  Problem Relation Age of Onset  . Breast cancer Sister        dx in her 4s  . Cancer Maternal Grandmother        unknown form of cancer  . Heart attack Maternal Grandfather   . Cancer Cousin        maternal cousin with unknown form of cancer    Past Surgical History:  Procedure Laterality Date  . ABDOMINAL HYSTERECTOMY    . APPENDECTOMY    . BREAST LUMPECTOMY WITH NEEDLE LOCALIZATION Left 10/09/2012   Procedure: BREAST LUMPECTOMY WITH NEEDLE LOCALIZATION;  Surgeon: Merrie Roof, MD;  Location: [facility];  Service: General;  Laterality: Left;  needle localization at SOLIS 7:30   . COLONOSCOPY    . fissures    . INTRAMEDULLARY (IM) NAIL INTERTROCHANTERIC Left 10/19/2018   Procedure: INTRAMEDULLARY (IM) NAIL INTERTROCHANTRIC;  Surgeon: Leandrew Koyanagi, MD;  Location: [facility];  Service: Orthopedics;  Laterality: Left;  . MOUTH SURGERY     Social History   Occupational History  . Occupation: Retired Restaurant manager, fast food  Tobacco Use  . Smoking status: Never Smoker  . Smokeless tobacco: Never Used  Substance and Sexual Activity  . Alcohol use: No  . Drug use: No  . Sexual activity: Not Currently

## 2020-05-12 DIAGNOSIS — M5459 Other low back pain: Secondary | ICD-10-CM | POA: Diagnosis not present

## 2020-05-13 DIAGNOSIS — M810 Age-related osteoporosis without current pathological fracture: Secondary | ICD-10-CM | POA: Diagnosis not present

## 2020-05-16 ENCOUNTER — Telehealth: Payer: Self-pay

## 2020-05-16 NOTE — Telephone Encounter (Signed)
Patient called she would like a call back from  Dr.Xu regarding her medications call [phone]

## 2020-05-16 NOTE — Telephone Encounter (Signed)
Called patient and she would like CB from Dr Erlinda Hong regarding her meds. I asked which meds and she said Prolia. States her and Dr Erlinda Hong had spoken about this med previously and also would like to know if Dr Erlinda Hong has heard of Osteo MD?  Please call her back at  [phone]

## 2020-05-16 NOTE — Telephone Encounter (Signed)
Called her and answered her questions.  I advised that taking calcium and vitamin D is a good idea.

## 2020-06-01 ENCOUNTER — Telehealth: Payer: Self-pay | Admitting: Physician Assistant

## 2020-06-01 NOTE — Telephone Encounter (Signed)
Patient returned call asking for a call back concerning scheduling an appointment. Patient was not sure about the message that was relayed to her by her husband. The number to contact patient is [phone]

## 2020-06-01 NOTE — Telephone Encounter (Signed)
Not sure what she is referring to.  She is welcome to make an appointment with me.

## 2020-06-01 NOTE — Telephone Encounter (Signed)
See message below.  Per last note ---f/u PRN.

## 2020-06-02 NOTE — Telephone Encounter (Signed)
Called patient no answer. She is more than welcome to schedule an appt with Dr. Erlinda Hong.

## 2020-06-23 DIAGNOSIS — M810 Age-related osteoporosis without current pathological fracture: Secondary | ICD-10-CM | POA: Diagnosis not present

## 2020-07-01 DIAGNOSIS — E039 Hypothyroidism, unspecified: Secondary | ICD-10-CM | POA: Diagnosis not present

## 2020-07-01 DIAGNOSIS — F439 Reaction to severe stress, unspecified: Secondary | ICD-10-CM | POA: Diagnosis not present

## 2020-07-01 DIAGNOSIS — I10 Essential (primary) hypertension: Secondary | ICD-10-CM | POA: Diagnosis not present

## 2020-07-01 DIAGNOSIS — E785 Hyperlipidemia, unspecified: Secondary | ICD-10-CM | POA: Diagnosis not present

## 2020-07-01 DIAGNOSIS — M810 Age-related osteoporosis without current pathological fracture: Secondary | ICD-10-CM | POA: Diagnosis not present

## 2020-07-01 DIAGNOSIS — E441 Mild protein-calorie malnutrition: Secondary | ICD-10-CM | POA: Diagnosis not present

## 2020-08-02 ENCOUNTER — Ambulatory Visit: Payer: Medicare PPO | Admitting: Internal Medicine

## 2020-08-25 DIAGNOSIS — D0512 Intraductal carcinoma in situ of left breast: Secondary | ICD-10-CM | POA: Diagnosis not present

## 2020-09-27 ENCOUNTER — Other Ambulatory Visit: Payer: Self-pay | Admitting: General Surgery

## 2020-09-27 DIAGNOSIS — D0512 Intraductal carcinoma in situ of left breast: Secondary | ICD-10-CM | POA: Diagnosis not present

## 2020-09-27 DIAGNOSIS — L720 Epidermal cyst: Secondary | ICD-10-CM | POA: Diagnosis not present

## 2020-09-27 DIAGNOSIS — D225 Melanocytic nevi of trunk: Secondary | ICD-10-CM | POA: Diagnosis not present

## 2020-10-10 IMAGING — CT CT L SPINE W/ CM
1 of 6 series · 6 of 14 positions shown, 8 images · non-contrast
Comparison: None

CLINICAL DATA: Spinal stenosis, lumbar region without neurogenic
claudication. Left lower extremity weakness involving the left upper
thigh and groin.
TECHNIQUE: Contiguous axial images were obtained through the Lumbar spine after
the intrathecal infusion of infusion. Coronal and sagittal
reconstructions were obtained of the axial image sets.

[Series 3: l spine soft · axial · 0.33mm/px · z∈[-316,-139]mm · 6 of 83 slices shown, 8 images]
[im 12/83  soft-tissue]
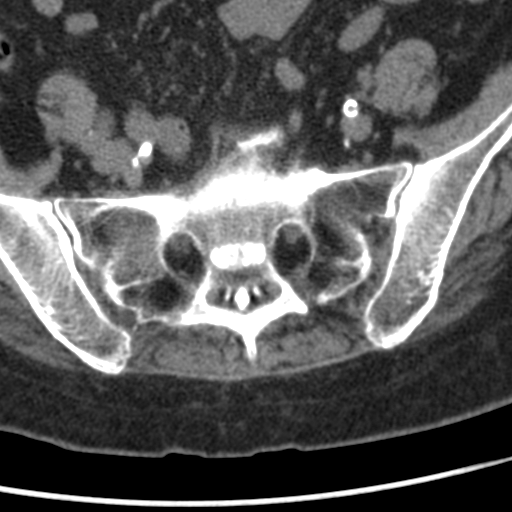
[im 12/83  bone]
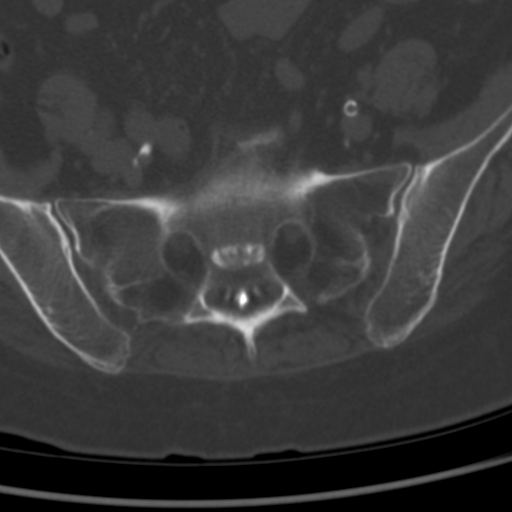
[im 24/83  bone]
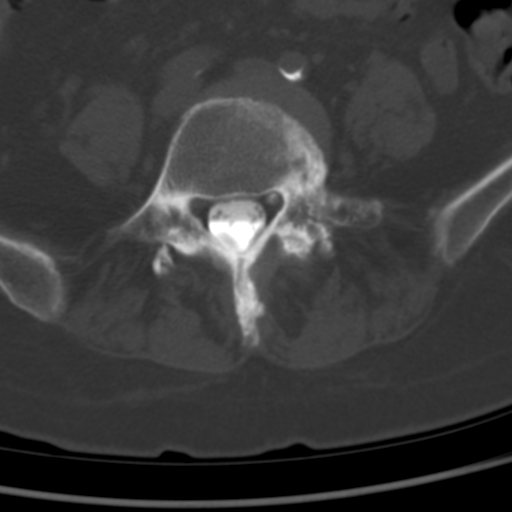
[im 36/83  bone]
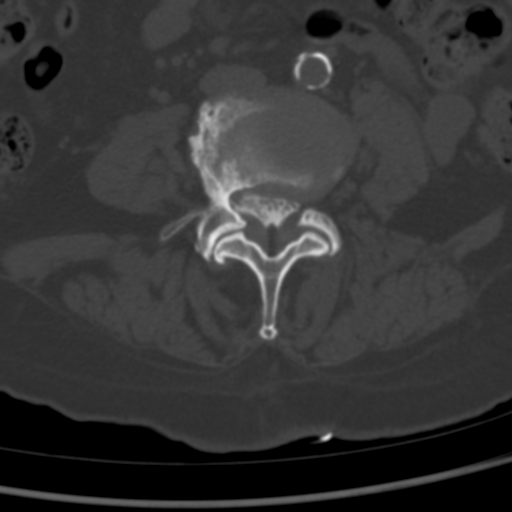
[im 47/83  bone]
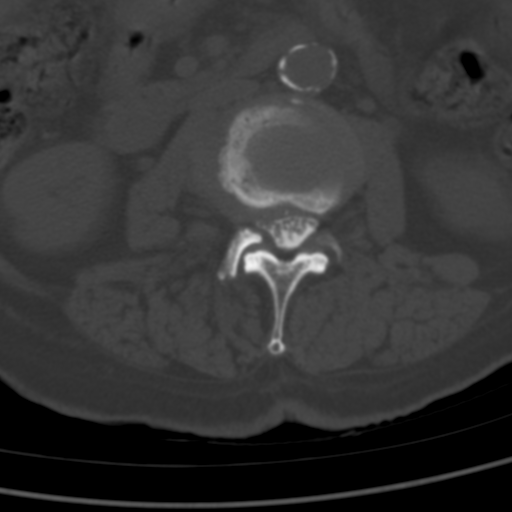
[im 59/83  soft-tissue]
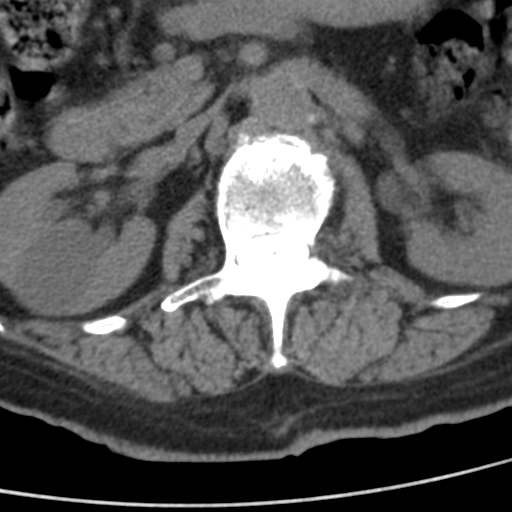
[im 59/83  bone]
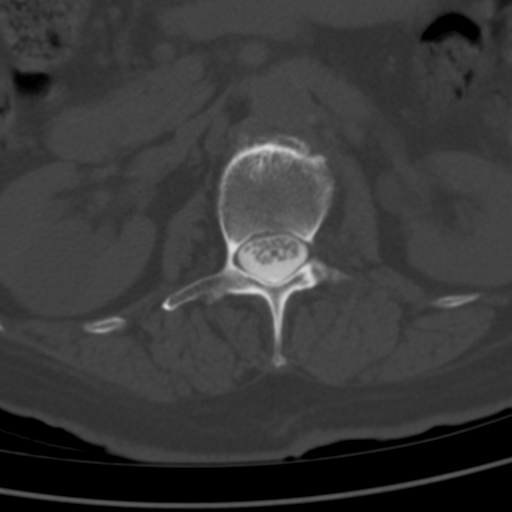
[im 71/83  bone]
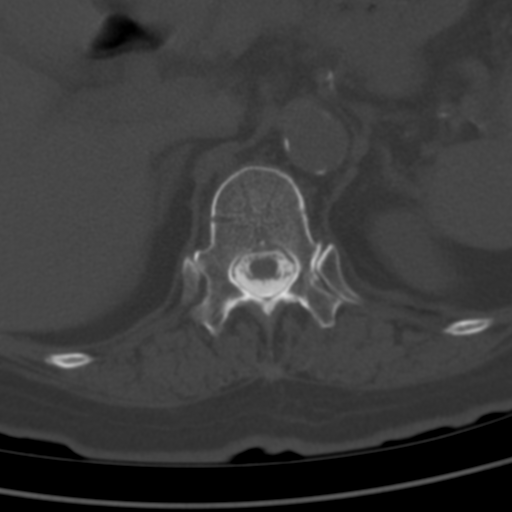

[6 of 14 positions shown; findings below may reference images not displayed]

EXAM:
LUMBAR MYELOGRAM

FLUOROSCOPY TIME:  Radiation Exposure Index (as provided by the
fluoroscopic device): 178.70 uGy*m2

Fluoroscopy Time:  27 seconds

Number of Acquired Images:  16

PROCEDURE:
After thorough discussion of risks and benefits of the procedure
including bleeding, infection, injury to nerves, blood vessels,
adjacent structures as well as headache and CSF leak, written and
oral informed consent was obtained. Consent was obtained by Dr.
Rachana Hinds. Time out form was completed.

Patient was positioned prone on the fluoroscopy table. Local
anesthesia was provided with 1% lidocaine without epinephrine after
prepped and draped in the usual sterile fashion. Puncture was
performed at L2-3 using a 3 1/2 inch 22-gauge spinal needle via left
paramedian approach. Using a single pass through the dura, the
needle was placed within the thecal sac, with return of clear CSF.
15 mL of Isovue [id number] was injected into the thecal sac, with normal
opacification of the nerve roots and cauda equina consistent with
free flow within the subarachnoid space.

I personally performed the lumbar puncture and administered the
intrathecal contrast. I also personally supervised acquisition of
the myelogram images.
FINDINGS: LUMBAR MYELOGRAM FINDINGS:

Levoconvex curvature of the lumbar spine is centered at L2-3. Grade
1 anterolisthesis at L5-S1 measures up to 9 mm. AP alignment is
otherwise anatomic.

Disc protrusions are present at each level through the lumbar spine.
Uncovering of a broad-based disc protrusion at L5-S1 associated with
the anterolisthesis is no significant change in the disc protrusions
with standing. There is no abnormal motion with flexion or extension
including L5-S1

Disc protrusions at L1-2 and L2-3 result in right subarticular
narrowing. There is a mild broad-based disc protrusion at L3-4 with
subarticular narrowing on both sides. Subarticular stenosis is
greater on the left at L4-5. There is a early truncation of the left
L5 nerve root.

Atherosclerotic calcifications are present.

CT LUMBAR MYELOGRAM FINDINGS:

Lumbar spine is imaged from midbody T11 through midbody of S3. Conus
medullaris terminates at L1.

Atherosclerotic calcifications are present in the aortic arch. There
is no aneurysm. A 3.5 cm hypodense lesion in the right kidney is
near water density. No other solid organ lesions are present. There
is no significant adenopathy.

T12-L1: Negative.

L1-2: A rightward disc protrusion is present. There is chronic loss
of disc height. There is gas in the disc space. Mild right
subarticular narrowing is present. Foraminal narrowing is present
bilaterally.

L2-3: A rightward disc protrusion is present. Moderate facet
hypertrophy is noted. This results in mild right subarticular
narrowing. Moderate right and mild left foraminal stenosis is
present.

L3-4: A broad-based disc protrusion is present. Facet hypertrophy
and ligamentum flavum thickening is noted. Mild foraminal narrowing
is present bilaterally.

L4-5: A leftward disc protrusion is present. Moderate facet
hypertrophy is worse left than right. This results in mild left
subarticular narrowing. Moderate left and mild right subarticular
narrowing is present. Mild foraminal narrowing is present
bilaterally.

L5-S1: There is uncovering of a broad-based disc protrusion. Central
canal is patent. Moderate foraminal stenosis is worse left than
right.
IMPRESSION: 1. Grade 1 anterolisthesis at L5-S1 with moderate foraminal
narrowing, left greater than right.
2. Moderate left and mild right subarticular narrowing with mild
bilateral foraminal stenosis at L4-5.
3. Mild foraminal narrowing is present bilaterally at L3-4.
4. Moderate right and mild left foraminal stenosis at L2-3.
5. Mild right subarticular narrowing at L1-2 with foraminal
narrowing bilaterally.

## 2020-12-05 DIAGNOSIS — M810 Age-related osteoporosis without current pathological fracture: Secondary | ICD-10-CM | POA: Diagnosis not present

## 2020-12-05 DIAGNOSIS — Z780 Asymptomatic menopausal state: Secondary | ICD-10-CM | POA: Diagnosis not present

## 2020-12-05 DIAGNOSIS — M85851 Other specified disorders of bone density and structure, right thigh: Secondary | ICD-10-CM | POA: Diagnosis not present

## 2020-12-13 DIAGNOSIS — H353131 Nonexudative age-related macular degeneration, bilateral, early dry stage: Secondary | ICD-10-CM | POA: Diagnosis not present

## 2020-12-27 DIAGNOSIS — M810 Age-related osteoporosis without current pathological fracture: Secondary | ICD-10-CM | POA: Diagnosis not present

## 2021-01-10 DIAGNOSIS — Z1231 Encounter for screening mammogram for malignant neoplasm of breast: Secondary | ICD-10-CM | POA: Diagnosis not present

## 2021-01-31 DIAGNOSIS — Z0000 Encounter for general adult medical examination without abnormal findings: Secondary | ICD-10-CM | POA: Diagnosis not present

## 2021-01-31 DIAGNOSIS — E785 Hyperlipidemia, unspecified: Secondary | ICD-10-CM | POA: Diagnosis not present

## 2021-01-31 DIAGNOSIS — I10 Essential (primary) hypertension: Secondary | ICD-10-CM | POA: Diagnosis not present

## 2021-01-31 DIAGNOSIS — I6523 Occlusion and stenosis of bilateral carotid arteries: Secondary | ICD-10-CM | POA: Diagnosis not present

## 2021-01-31 DIAGNOSIS — E039 Hypothyroidism, unspecified: Secondary | ICD-10-CM | POA: Diagnosis not present

## 2021-01-31 DIAGNOSIS — F419 Anxiety disorder, unspecified: Secondary | ICD-10-CM | POA: Diagnosis not present

## 2021-01-31 DIAGNOSIS — Z853 Personal history of malignant neoplasm of breast: Secondary | ICD-10-CM | POA: Diagnosis not present

## 2021-01-31 DIAGNOSIS — F5101 Primary insomnia: Secondary | ICD-10-CM | POA: Diagnosis not present

## 2021-01-31 DIAGNOSIS — M810 Age-related osteoporosis without current pathological fracture: Secondary | ICD-10-CM | POA: Diagnosis not present

## 2021-01-31 DIAGNOSIS — M7989 Other specified soft tissue disorders: Secondary | ICD-10-CM | POA: Diagnosis not present

## 2021-02-02 ENCOUNTER — Other Ambulatory Visit: Payer: Self-pay | Admitting: Family Medicine

## 2021-02-02 DIAGNOSIS — M7989 Other specified soft tissue disorders: Secondary | ICD-10-CM

## 2021-02-16 ENCOUNTER — Ambulatory Visit
Admission: RE | Admit: 2021-02-16 | Discharge: 2021-02-16 | Disposition: A | Discharge status: Home / Self Care | Payer: Medicare PPO | Source: Ambulatory Visit | Attending: Family Medicine | Admitting: Family Medicine

## 2021-02-16 DIAGNOSIS — Z853 Personal history of malignant neoplasm of breast: Secondary | ICD-10-CM | POA: Diagnosis not present

## 2021-02-16 DIAGNOSIS — R222 Localized swelling, mass and lump, trunk: Secondary | ICD-10-CM | POA: Diagnosis not present

## 2021-02-16 DIAGNOSIS — M7989 Other specified soft tissue disorders: Secondary | ICD-10-CM

## 2021-06-13 DIAGNOSIS — H353131 Nonexudative age-related macular degeneration, bilateral, early dry stage: Secondary | ICD-10-CM | POA: Diagnosis not present

## 2021-06-13 DIAGNOSIS — H5201 Hypermetropia, right eye: Secondary | ICD-10-CM | POA: Diagnosis not present

## 2021-08-01 DIAGNOSIS — F419 Anxiety disorder, unspecified: Secondary | ICD-10-CM | POA: Diagnosis not present

## 2021-08-01 DIAGNOSIS — I6523 Occlusion and stenosis of bilateral carotid arteries: Secondary | ICD-10-CM | POA: Diagnosis not present

## 2021-08-01 DIAGNOSIS — L858 Other specified epidermal thickening: Secondary | ICD-10-CM | POA: Diagnosis not present

## 2021-08-01 DIAGNOSIS — R252 Cramp and spasm: Secondary | ICD-10-CM | POA: Diagnosis not present

## 2021-08-01 DIAGNOSIS — M810 Age-related osteoporosis without current pathological fracture: Secondary | ICD-10-CM | POA: Diagnosis not present

## 2021-08-01 DIAGNOSIS — E039 Hypothyroidism, unspecified: Secondary | ICD-10-CM | POA: Diagnosis not present

## 2021-08-01 DIAGNOSIS — E785 Hyperlipidemia, unspecified: Secondary | ICD-10-CM | POA: Diagnosis not present

## 2021-08-01 DIAGNOSIS — I10 Essential (primary) hypertension: Secondary | ICD-10-CM | POA: Diagnosis not present

## 2021-08-01 DIAGNOSIS — F5101 Primary insomnia: Secondary | ICD-10-CM | POA: Diagnosis not present

## 2021-11-06 DIAGNOSIS — F419 Anxiety disorder, unspecified: Secondary | ICD-10-CM | POA: Diagnosis not present

## 2021-11-06 DIAGNOSIS — E785 Hyperlipidemia, unspecified: Secondary | ICD-10-CM | POA: Diagnosis not present

## 2021-11-06 DIAGNOSIS — M1990 Unspecified osteoarthritis, unspecified site: Secondary | ICD-10-CM | POA: Diagnosis not present

## 2021-11-06 DIAGNOSIS — E039 Hypothyroidism, unspecified: Secondary | ICD-10-CM | POA: Diagnosis not present

## 2021-11-06 DIAGNOSIS — G4700 Insomnia, unspecified: Secondary | ICD-10-CM | POA: Diagnosis not present

## 2021-11-06 DIAGNOSIS — H3530 Unspecified macular degeneration: Secondary | ICD-10-CM | POA: Diagnosis not present

## 2021-11-06 DIAGNOSIS — I739 Peripheral vascular disease, unspecified: Secondary | ICD-10-CM | POA: Diagnosis not present

## 2021-11-06 DIAGNOSIS — M8580 Other specified disorders of bone density and structure, unspecified site: Secondary | ICD-10-CM | POA: Diagnosis not present

## 2021-11-06 DIAGNOSIS — I10 Essential (primary) hypertension: Secondary | ICD-10-CM | POA: Diagnosis not present

## 2021-12-26 DIAGNOSIS — H353131 Nonexudative age-related macular degeneration, bilateral, early dry stage: Secondary | ICD-10-CM | POA: Diagnosis not present

## 2021-12-26 DIAGNOSIS — H43813 Vitreous degeneration, bilateral: Secondary | ICD-10-CM | POA: Diagnosis not present

## 2021-12-26 DIAGNOSIS — H02834 Dermatochalasis of left upper eyelid: Secondary | ICD-10-CM | POA: Diagnosis not present

## 2021-12-26 DIAGNOSIS — H04123 Dry eye syndrome of bilateral lacrimal glands: Secondary | ICD-10-CM | POA: Diagnosis not present

## 2021-12-26 DIAGNOSIS — H18513 Endothelial corneal dystrophy, bilateral: Secondary | ICD-10-CM | POA: Diagnosis not present

## 2021-12-26 DIAGNOSIS — H02831 Dermatochalasis of right upper eyelid: Secondary | ICD-10-CM | POA: Diagnosis not present

## 2021-12-29 DIAGNOSIS — M810 Age-related osteoporosis without current pathological fracture: Secondary | ICD-10-CM | POA: Diagnosis not present

## 2022-02-13 ENCOUNTER — Other Ambulatory Visit: Payer: Self-pay | Admitting: Family Medicine

## 2022-02-13 DIAGNOSIS — M810 Age-related osteoporosis without current pathological fracture: Secondary | ICD-10-CM | POA: Diagnosis not present

## 2022-02-13 DIAGNOSIS — Z0000 Encounter for general adult medical examination without abnormal findings: Secondary | ICD-10-CM | POA: Diagnosis not present

## 2022-02-13 DIAGNOSIS — R609 Edema, unspecified: Secondary | ICD-10-CM | POA: Diagnosis not present

## 2022-02-13 DIAGNOSIS — I6523 Occlusion and stenosis of bilateral carotid arteries: Secondary | ICD-10-CM | POA: Diagnosis not present

## 2022-02-13 DIAGNOSIS — F419 Anxiety disorder, unspecified: Secondary | ICD-10-CM | POA: Diagnosis not present

## 2022-02-13 DIAGNOSIS — E039 Hypothyroidism, unspecified: Secondary | ICD-10-CM | POA: Diagnosis not present

## 2022-02-13 DIAGNOSIS — R0989 Other specified symptoms and signs involving the circulatory and respiratory systems: Secondary | ICD-10-CM

## 2022-02-13 DIAGNOSIS — F5101 Primary insomnia: Secondary | ICD-10-CM | POA: Diagnosis not present

## 2022-02-13 DIAGNOSIS — I10 Essential (primary) hypertension: Secondary | ICD-10-CM | POA: Diagnosis not present

## 2022-02-13 DIAGNOSIS — R197 Diarrhea, unspecified: Secondary | ICD-10-CM | POA: Diagnosis not present

## 2022-02-13 DIAGNOSIS — E785 Hyperlipidemia, unspecified: Secondary | ICD-10-CM | POA: Diagnosis not present

## 2022-02-19 ENCOUNTER — Ambulatory Visit
Admission: RE | Admit: 2022-02-19 | Discharge: 2022-02-19 | Disposition: A | Discharge status: Home / Self Care | Payer: Medicare PPO | Source: Ambulatory Visit | Attending: Family Medicine | Admitting: Family Medicine

## 2022-02-19 DIAGNOSIS — I743 Embolism and thrombosis of arteries of the lower extremities: Secondary | ICD-10-CM | POA: Diagnosis not present

## 2022-02-19 DIAGNOSIS — I739 Peripheral vascular disease, unspecified: Secondary | ICD-10-CM | POA: Diagnosis not present

## 2022-02-19 DIAGNOSIS — R0989 Other specified symptoms and signs involving the circulatory and respiratory systems: Secondary | ICD-10-CM

## 2022-03-26 DIAGNOSIS — R194 Change in bowel habit: Secondary | ICD-10-CM | POA: Diagnosis not present

## 2022-05-15 DIAGNOSIS — R194 Change in bowel habit: Secondary | ICD-10-CM | POA: Diagnosis not present

## 2022-05-15 DIAGNOSIS — R159 Full incontinence of feces: Secondary | ICD-10-CM | POA: Diagnosis not present

## 2022-06-19 DIAGNOSIS — H353131 Nonexudative age-related macular degeneration, bilateral, early dry stage: Secondary | ICD-10-CM | POA: Diagnosis not present

## 2022-08-24 DIAGNOSIS — I6523 Occlusion and stenosis of bilateral carotid arteries: Secondary | ICD-10-CM | POA: Diagnosis not present

## 2022-08-24 DIAGNOSIS — E785 Hyperlipidemia, unspecified: Secondary | ICD-10-CM | POA: Diagnosis not present

## 2022-08-24 DIAGNOSIS — F5101 Primary insomnia: Secondary | ICD-10-CM | POA: Diagnosis not present

## 2022-08-24 DIAGNOSIS — R29818 Other symptoms and signs involving the nervous system: Secondary | ICD-10-CM | POA: Diagnosis not present

## 2022-08-24 DIAGNOSIS — F419 Anxiety disorder, unspecified: Secondary | ICD-10-CM | POA: Diagnosis not present

## 2022-08-24 DIAGNOSIS — I10 Essential (primary) hypertension: Secondary | ICD-10-CM | POA: Diagnosis not present

## 2022-08-24 DIAGNOSIS — I739 Peripheral vascular disease, unspecified: Secondary | ICD-10-CM | POA: Diagnosis not present

## 2022-08-24 DIAGNOSIS — K529 Noninfective gastroenteritis and colitis, unspecified: Secondary | ICD-10-CM | POA: Diagnosis not present

## 2022-08-24 DIAGNOSIS — L989 Disorder of the skin and subcutaneous tissue, unspecified: Secondary | ICD-10-CM | POA: Diagnosis not present

## 2022-10-09 ENCOUNTER — Other Ambulatory Visit (INDEPENDENT_AMBULATORY_CARE_PROVIDER_SITE_OTHER): Payer: Medicare PPO

## 2022-10-09 ENCOUNTER — Ambulatory Visit: Payer: Medicare PPO | Admitting: Physician Assistant

## 2022-10-09 ENCOUNTER — Other Ambulatory Visit: Payer: Self-pay

## 2022-10-09 DIAGNOSIS — S92301A Fracture of unspecified metatarsal bone(s), right foot, initial encounter for closed fracture: Secondary | ICD-10-CM

## 2022-10-09 DIAGNOSIS — M25551 Pain in right hip: Secondary | ICD-10-CM | POA: Diagnosis not present

## 2022-10-09 DIAGNOSIS — M25552 Pain in left hip: Secondary | ICD-10-CM | POA: Diagnosis not present

## 2022-10-09 DIAGNOSIS — M1611 Unilateral primary osteoarthritis, right hip: Secondary | ICD-10-CM

## 2022-10-09 NOTE — Progress Notes (Signed)
Office Visit Note   [name]           Date of Birth: 1926/12/16           MRN: [id number] Visit Date: 10/09/2022              Requested by: Laurann Montana, MD [phone] [address] [place],  [place] [address] PCP: Laurann Montana, MD   Assessment & Plan: Visit Diagnoses:  1. Primary osteoarthritis of right hip   2. Pain of left hip   3. Closed nondisplaced fracture of metatarsal bone of right foot, unspecified metatarsal, initial encounter     Plan: Impression is right hip OA and right foot third metatarsal stress fracture.  In regards to her right foot, we have discussed referral to Dr. Shon Baton for ultrasound-guided cortisone injection for which she is agreeable to.  She will make an appointment on her way out today.  Regards to her foot, will place her in a postop shoe weightbearing as tolerated.  Ice and elevate for pain and swelling.  Take Tylenol and ibuprofen as needed.  She will let me know if she needs anything stronger.  She will follow-up in 2 weeks for repeat evaluation and three-view x-rays of the right foot.  Call with concerns or questions.  Follow-Up Instructions: Return if symptoms worsen or fail to improve.   Orders:  Orders Placed This Encounter  Procedures   XR HIP UNILAT W OR W/O PELVIS 2-3 VIEWS LEFT   XR HIP UNILAT W OR W/O PELVIS 2-3 VIEWS RIGHT   XR Foot Complete Right   No orders of the defined types were placed in this encounter.     Procedures: No procedures performed   Clinical Data: No additional findings.   Subjective: Chief Complaint  Patient presents with   Right Hip - Pain    HPI patient is a pleasant 89 year old female who comes in today with right hip and right foot pain.  Regards to her right hip, symptoms began about a week ago after a trip to [place].  She denies any injury or change in activity but notes there is a low toilet seat that she frequently used.  The pain she is having is to the groin and anterior  thigh.  Worse with walking.  She is not taking medication for this.  In regards to her right foot, she is having pain to the top of the foot for the past 3 days.  No injury there.  Symptoms are worse with walking.   Review of Systems as detailed in HPI.  All others reviewed and are negative.   Objective: Vital Signs: There were no vitals taken for this visit.  Physical Exam well-developed well-nourished female no acute distress.  Alert and oriented x 3.  Ortho Exam right hip exam reveals pain with logroll and FADIR.  Pain with Stinchfield.  Right foot exam shows 2+ pitting edema.  Tenderness across the dorsum of the foot primarily to the lateral aspect.  Specialty Comments:  No specialty comments available.  Imaging: XR Foot Complete Right  Result Date: 10/09/2022 X-rays demonstrate fracture through the distal aspect of the third metatarsal  XR HIP UNILAT W OR W/O PELVIS 2-3 VIEWS RIGHT  Result Date: 10/09/2022 X-rays demonstrate mild degenerative changes to the right hip joint.  No other acute or structural abnormalities noted    PMFS History: Patient Active Problem List   Diagnosis Date Noted   Stress reaction of shaft of femur,  right, initial encounter 11/20/2019   Age-related osteoporosis without current pathological fracture 07/31/2019   Fall at home, initial encounter 10/19/2018   Closed left subtrochanteric femur fracture, initial encounter (HCC) 10/18/2018   Closed left subtrochanteric femur fracture (HCC) 10/18/2018   Ductal carcinoma in situ (DCIS) of left breast 12/27/2016   Mastitis, left, acute 12/19/2012   Malignant neoplasm of upper-outer quadrant of left breast in female, estrogen receptor positive (HCC) 09/15/2012   Chest pain 05/28/2012   Hypothyroidism 05/28/2012   Hypertension 05/28/2012   Hyperlipidemia 05/28/2012   Past Medical History:  Diagnosis Date   Arthritis    Breast cancer (HCC)    Hyperlipidemia    Hypertension    Osteoporosis    Thyroid  disease     Family History  Problem Relation Age of Onset   Breast cancer Sister        dx in her 62s   Cancer Maternal Grandmother        unknown form of cancer   Heart attack Maternal Grandfather    Cancer Cousin        maternal cousin with unknown form of cancer    Past Surgical History:  Procedure Laterality Date   ABDOMINAL HYSTERECTOMY     APPENDECTOMY     BREAST LUMPECTOMY WITH NEEDLE LOCALIZATION Left 10/09/2012   Procedure: BREAST LUMPECTOMY WITH NEEDLE LOCALIZATION;  Surgeon: [name], MD;  Location: [facility];  Service: General;  Laterality: Left;  needle localization at SOLIS 7:30    COLONOSCOPY     fissures     INTRAMEDULLARY (IM) NAIL INTERTROCHANTERIC Left 10/19/2018   Procedure: INTRAMEDULLARY (IM) NAIL INTERTROCHANTRIC;  Surgeon: Tarry Kos, MD;  Location: [facility];  Service: Orthopedics;  Laterality: Left;   MOUTH SURGERY     Social History   Occupational History   Occupation: Retired Theme park manager  Tobacco Use   Smoking status: Never   Smokeless tobacco: Never  Substance and Sexual Activity   Alcohol use: No   Drug use: No   Sexual activity: Not Currently

## 2022-10-09 NOTE — Addendum Note (Signed)
Addended by: Wendi Maya on: 10/09/2022 04:24 PM   Modules accepted: Orders

## 2022-10-16 DIAGNOSIS — C44311 Basal cell carcinoma of skin of nose: Secondary | ICD-10-CM | POA: Diagnosis not present

## 2022-10-18 ENCOUNTER — Ambulatory Visit: Payer: Medicare PPO | Admitting: Sports Medicine

## 2022-10-23 ENCOUNTER — Ambulatory Visit: Payer: Medicare PPO | Admitting: Physician Assistant

## 2022-10-24 ENCOUNTER — Other Ambulatory Visit: Payer: Self-pay

## 2022-10-24 ENCOUNTER — Ambulatory Visit: Payer: Medicare PPO | Admitting: Sports Medicine

## 2022-10-24 DIAGNOSIS — M25551 Pain in right hip: Secondary | ICD-10-CM | POA: Diagnosis not present

## 2022-10-24 DIAGNOSIS — M1611 Unilateral primary osteoarthritis, right hip: Secondary | ICD-10-CM

## 2022-10-24 MED ORDER — LIDOCAINE HCL 1 % IJ SOLN
4.0000 mL | INTRAMUSCULAR | Status: AC | PRN
Start: 2022-10-24 — End: 2022-10-24
  Administered 2022-10-24: 4 mL

## 2022-10-24 MED ORDER — METHYLPREDNISOLONE ACETATE 40 MG/ML IJ SUSP
40.0000 mg | INTRAMUSCULAR | Status: AC | PRN
Start: 2022-10-24 — End: 2022-10-24
  Administered 2022-10-24: 40 mg via INTRA_ARTICULAR

## 2022-10-24 NOTE — Progress Notes (Signed)
   Procedure Note  [name]             Date of Birth: April 10, 1926           MRN: [id number]             Visit Date: 10/24/2022  Procedures: Visit Diagnoses:  1. Pain in right hip   2. Primary osteoarthritis of right hip    Large Joint Inj: R hip joint on 10/24/2022 2:43 PM Indications: pain Details: 22 G 3.5 in needle, ultrasound-guided anterior approach Medications: 4 mL lidocaine 1 %; 40 mg methylPREDNISolone acetate 40 MG/ML Outcome: tolerated well, no immediate complications  Procedure: US-guided intra-articular hip injection, right After discussion on risks/benefits/indications and informed verbal consent was obtained, a timeout was performed. Patient was lying supine on exam table. The hip was cleaned with betadine and alcohol swabs. Then utilizing ultrasound guidance, the patient's femoral head and neck junction was identified and subsequently injected with 4:1  lidocaine:depomedrol via an in-plane approach with ultrasound visualization of the injectate administered into the hip joint. Patient tolerated procedure well without immediate complications.  Procedure, treatment alternatives, risks and benefits explained, specific risks discussed. Consent was given by the patient. Immediately prior to procedure a time out was called to verify the correct patient, procedure, equipment, support staff and site/side marked as required. Patient was prepped and draped in the usual sterile fashion.     - I evaluated the patient about 5 minutes post-injection and she was doing well - follow-up with Tessa Lerner as indicated; I am happy to see them as needed  Madelyn Brunner, DO Primary Care Sports Medicine Physician  [facility]  This note was dictated using Dragon naturally speaking software and may contain errors in syntax, spelling, or content which have not been identified prior to signing this note.

## 2022-10-29 ENCOUNTER — Ambulatory Visit: Payer: Medicare PPO | Admitting: Physician Assistant

## 2022-10-29 ENCOUNTER — Other Ambulatory Visit (INDEPENDENT_AMBULATORY_CARE_PROVIDER_SITE_OTHER): Payer: Medicare PPO

## 2022-10-29 ENCOUNTER — Encounter: Payer: Self-pay | Admitting: Physician Assistant

## 2022-10-29 DIAGNOSIS — S92301A Fracture of unspecified metatarsal bone(s), right foot, initial encounter for closed fracture: Secondary | ICD-10-CM

## 2022-10-29 NOTE — Progress Notes (Signed)
Office Visit Note   [name]           Date of Birth: Feb 22, 1927           MRN: [id number] Visit Date: 10/29/2022              Requested by: Laurann Montana, MD [phone] [place],  [place] [address] PCP: Laurann Montana, MD   Assessment & Plan: Visit Diagnoses:  1. Closed nondisplaced fracture of metatarsal bone of right foot, unspecified metatarsal, initial encounter     Plan: Impression is approximately 3 weeks status post right foot third metatarsal stress fracture.patient is clinically and radiographically improving.  Continue wearing post-op shoe for three more weeks.  Follow up in three weeks for recheck and repeat xrays of the right foot.    Follow-Up Instructions: Return in about 3 weeks (around 11/19/2022).   Orders:  Orders Placed This Encounter  Procedures   XR Foot Complete Right   No orders of the defined types were placed in this encounter.     Procedures: No procedures performed   Clinical Data: No additional findings.   Subjective: Chief Complaint  Patient presents with   Right Foot - Follow-up    Third metatarsal fracture    HPI patient is a pleasant 89 year old female who comes in today approximately 3 weeks status post right foot third metatarsal stress fracture.  She has been compliant wearing a postop shoe.  She has been icing and elevating for pain and swelling.  Overall, she feels much better.  Review of Systems as detailed in HPI.  All others reviewed and are negative.   Objective: Vital Signs: There were no vitals taken for this visit.  Physical Exam well-developed well-nourished female no acute distress.  Alert and oriented x 3.  Ortho Exam right foot exam: Mild swelling to the right foot.  She has moderate tenderness to the fracture site.  She is neurovascularly intact distally.  Specialty Comments:  No specialty comments available.  Imaging: XR Foot Complete Right  Result Date: 10/29/2022 X-rays  demonstrate callus formation to the fracture site.     PMFS History: Patient Active Problem List   Diagnosis Date Noted   Stress reaction of shaft of femur, right, initial encounter 11/20/2019   Age-related osteoporosis without current pathological fracture 07/31/2019   Fall at home, initial encounter 10/19/2018   Closed left subtrochanteric femur fracture, initial encounter (HCC) 10/18/2018   Closed left subtrochanteric femur fracture (HCC) 10/18/2018   Ductal carcinoma in situ (DCIS) of left breast 12/27/2016   Mastitis, left, acute 12/19/2012   Malignant neoplasm of upper-outer quadrant of left breast in female, estrogen receptor positive (HCC) 09/15/2012   Chest pain 05/28/2012   Hypothyroidism 05/28/2012   Hypertension 05/28/2012   Hyperlipidemia 05/28/2012   Past Medical History:  Diagnosis Date   Arthritis    Breast cancer (HCC)    Hyperlipidemia    Hypertension    Osteoporosis    Thyroid disease     Family History  Problem Relation Age of Onset   Breast cancer Sister        dx in her 44s   Cancer Maternal Grandmother        unknown form of cancer   Heart attack Maternal Grandfather    Cancer Cousin        maternal cousin with unknown form of cancer    Past Surgical History:  Procedure Laterality Date   ABDOMINAL HYSTERECTOMY  APPENDECTOMY     BREAST LUMPECTOMY WITH NEEDLE LOCALIZATION Left 10/09/2012   Procedure: BREAST LUMPECTOMY WITH NEEDLE LOCALIZATION;  Surgeon: [name], MD;  Location: [facility];  Service: General;  Laterality: Left;  needle localization at SOLIS 7:30    COLONOSCOPY     fissures     INTRAMEDULLARY (IM) NAIL INTERTROCHANTERIC Left 10/19/2018   Procedure: INTRAMEDULLARY (IM) NAIL INTERTROCHANTRIC;  Surgeon: Tarry Kos, MD;  Location: [facility];  Service: Orthopedics;  Laterality: Left;   MOUTH SURGERY     Social History   Occupational History   Occupation: Retired Theme park manager  Tobacco Use   Smoking status:  Never   Smokeless tobacco: Never  Substance and Sexual Activity   Alcohol use: No   Drug use: No   Sexual activity: Not Currently

## 2022-11-27 ENCOUNTER — Other Ambulatory Visit (INDEPENDENT_AMBULATORY_CARE_PROVIDER_SITE_OTHER): Payer: Medicare PPO

## 2022-11-27 ENCOUNTER — Encounter: Payer: Self-pay | Admitting: Orthopaedic Surgery

## 2022-11-27 ENCOUNTER — Ambulatory Visit: Payer: Medicare PPO | Admitting: Orthopaedic Surgery

## 2022-11-27 DIAGNOSIS — S92301A Fracture of unspecified metatarsal bone(s), right foot, initial encounter for closed fracture: Secondary | ICD-10-CM

## 2022-11-27 DIAGNOSIS — M25551 Pain in right hip: Secondary | ICD-10-CM | POA: Diagnosis not present

## 2022-11-27 NOTE — Progress Notes (Signed)
Office Visit Note   [name]           Date of Birth: July 02, 1926           MRN: [id number] Visit Date: 11/27/2022              Requested by: Laurann Montana, MD [phone] [address] [place],  [place] [address] PCP: Laurann Montana, MD   Assessment & Plan: Visit Diagnoses:  1. Closed nondisplaced fracture of metatarsal bone of right foot, unspecified metatarsal, initial encounter   2. Pain in right hip     Plan: Becky Montoya is now 6 weeks from the right third metatarsal stress fracture.  She reports minimal pain.  She can now transition to a regular shoe as symptoms allow.  Increase activity as tolerated.  In regards to the right groin pain this has significantly improved since getting the cortisone injection.  She is not reporting symptoms consistent with impending subtrochanteric stress fracture.  She will let me know if she begins to have thigh pain or if her symptoms worsen at which point we would need an MRI.  I would like to see her back in 6 weeks for recheck of the right foot with repeat x-rays.  Follow-Up Instructions: Return in about 6 weeks (around 01/08/2023).   Orders:  Orders Placed This Encounter  Procedures   XR Foot Complete Right   No orders of the defined types were placed in this encounter.     Procedures: No procedures performed   Clinical Data: No additional findings.   Subjective: Chief Complaint  Patient presents with   Right Foot - Follow-up    Third metatarsal fracture    HPI Becky Montoya follows up today for right hip pain and right foot stress fracture. Review of Systems  Constitutional: Negative.   HENT: Negative.    Eyes: Negative.   Respiratory: Negative.    Cardiovascular: Negative.   Endocrine: Negative.   Musculoskeletal: Negative.   Neurological: Negative.   Hematological: Negative.   Psychiatric/Behavioral: Negative.    All other systems reviewed and are negative.    Objective: Vital Signs: There were no  vitals taken for this visit.  Physical Exam Vitals and nursing note reviewed.  Constitutional:      Appearance: She is well-developed.  HENT:     Head: Normocephalic and atraumatic.  Pulmonary:     Effort: Pulmonary effort is normal.  Abdominal:     Palpations: Abdomen is soft.  Musculoskeletal:     Cervical back: Neck supple.  Skin:    General: Skin is warm.     Capillary Refill: Capillary refill takes less than 2 seconds.  Neurological:     Mental Status: She is alert and oriented to person, place, and time.  Psychiatric:        Behavior: Behavior normal.        Thought Content: Thought content normal.        Judgment: Judgment normal.     Ortho Exam Examination of the right foot shows minimal tenderness to the third ray.  No swelling. Examination of the right hip is unremarkable. Specialty Comments:  No specialty comments available.  Imaging: XR Foot Complete Right  Result Date: 11/27/2022 X-rays demonstrate abundant callus formation to the third metatarsal.    PMFS History: Patient Active Problem List   Diagnosis Date Noted   Stress reaction of shaft of femur, right, initial encounter 11/20/2019   Age-related osteoporosis without current pathological fracture 07/31/2019  Fall at home, initial encounter 10/19/2018   Closed left subtrochanteric femur fracture, initial encounter (HCC) 10/18/2018   Closed left subtrochanteric femur fracture (HCC) 10/18/2018   Ductal carcinoma in situ (DCIS) of left breast 12/27/2016   Mastitis, left, acute 12/19/2012   Malignant neoplasm of upper-outer quadrant of left breast in female, estrogen receptor positive (HCC) 09/15/2012   Chest pain 05/28/2012   Hypothyroidism 05/28/2012   Hypertension 05/28/2012   Hyperlipidemia 05/28/2012   Past Medical History:  Diagnosis Date   Arthritis    Breast cancer (HCC)    Hyperlipidemia    Hypertension    Osteoporosis    Thyroid disease     Family History  Problem Relation Age of  Onset   Breast cancer Sister        dx in her 41s   Cancer Maternal Grandmother        unknown form of cancer   Heart attack Maternal Grandfather    Cancer Cousin        maternal cousin with unknown form of cancer    Past Surgical History:  Procedure Laterality Date   ABDOMINAL HYSTERECTOMY     APPENDECTOMY     BREAST LUMPECTOMY WITH NEEDLE LOCALIZATION Left 10/09/2012   Procedure: BREAST LUMPECTOMY WITH NEEDLE LOCALIZATION;  Surgeon: [name], MD;  Location: [facility];  Service: General;  Laterality: Left;  needle localization at SOLIS 7:30    COLONOSCOPY     fissures     INTRAMEDULLARY (IM) NAIL INTERTROCHANTERIC Left 10/19/2018   Procedure: INTRAMEDULLARY (IM) NAIL INTERTROCHANTRIC;  Surgeon: Tarry Kos, MD;  Location: [facility];  Service: Orthopedics;  Laterality: Left;   MOUTH SURGERY     Social History   Occupational History   Occupation: Retired Theme park manager  Tobacco Use   Smoking status: Never   Smokeless tobacco: Never  Substance and Sexual Activity   Alcohol use: No   Drug use: No   Sexual activity: Not Currently

## 2022-12-14 DIAGNOSIS — Z85828 Personal history of other malignant neoplasm of skin: Secondary | ICD-10-CM | POA: Diagnosis not present

## 2022-12-14 DIAGNOSIS — Z08 Encounter for follow-up examination after completed treatment for malignant neoplasm: Secondary | ICD-10-CM | POA: Diagnosis not present

## 2022-12-18 DIAGNOSIS — H353131 Nonexudative age-related macular degeneration, bilateral, early dry stage: Secondary | ICD-10-CM | POA: Diagnosis not present

## 2022-12-21 DIAGNOSIS — M8588 Other specified disorders of bone density and structure, other site: Secondary | ICD-10-CM | POA: Diagnosis not present

## 2023-01-01 DIAGNOSIS — M810 Age-related osteoporosis without current pathological fracture: Secondary | ICD-10-CM | POA: Diagnosis not present

## 2023-01-08 ENCOUNTER — Ambulatory Visit: Payer: Medicare PPO | Admitting: Orthopaedic Surgery

## 2023-01-08 ENCOUNTER — Other Ambulatory Visit (INDEPENDENT_AMBULATORY_CARE_PROVIDER_SITE_OTHER): Payer: Self-pay

## 2023-01-08 DIAGNOSIS — S92301A Fracture of unspecified metatarsal bone(s), right foot, initial encounter for closed fracture: Secondary | ICD-10-CM | POA: Diagnosis not present

## 2023-01-08 NOTE — Progress Notes (Signed)
Office Visit Note   [name]           Date of Birth: [birth year]           MRN: [id number] Visit Date: 01/08/2023              Requested by: Laurann Montana, MD [phone] [place],  [place] [address] PCP: Laurann Montana, MD   Assessment & Plan: Visit Diagnoses:  1. Closed nondisplaced fracture of metatarsal bone of right foot, unspecified metatarsal, initial encounter     Plan: Becky Montoya is now 3 months status post right third metatarsal fracture.  X-rays show abundant callus formation and fracture consolidation.  She is released to activity as tolerated.  Follow-up as needed.  Follow-Up Instructions: No follow-ups on file.   Orders:  Orders Placed This Encounter  Procedures   XR Foot Complete Right   No orders of the defined types were placed in this encounter.     Procedures: No procedures performed   Clinical Data: No additional findings.   Subjective: Chief Complaint  Patient presents with   Right Foot - Follow-up    Third metatarsal fracture    HPI Becky Montoya returns today for follow-up for right third metatarsal fracture.  She is doing well overall.  She is wearing regular shoes.  Experiences pins-and-needles sensation occasionally. Review of Systems   Objective: Vital Signs: There were no vitals taken for this visit.  Physical Exam  Ortho Exam Exam of the right foot is benign. Specialty Comments:  No specialty comments available.  Imaging: XR Foot Complete Right  Result Date: 01/08/2023 X-rays of the right foot show abundant callus formation to the third metatarsal fracture consistent with near complete healing of the fracture.    PMFS History: Patient Active Problem List   Diagnosis Date Noted   Stress reaction of shaft of femur, right, initial encounter 11/20/2019   Age-related osteoporosis without current pathological fracture 07/31/2019   Fall at home, initial encounter 10/19/2018   Closed left subtrochanteric  femur fracture, initial encounter (HCC) 10/18/2018   Closed left subtrochanteric femur fracture (HCC) 10/18/2018   Ductal carcinoma in situ (DCIS) of left breast 12/27/2016   Mastitis, left, acute 12/19/2012   Malignant neoplasm of upper-outer quadrant of left breast in female, estrogen receptor positive (HCC) 09/15/2012   Chest pain 05/28/2012   Hypothyroidism 05/28/2012   Hypertension 05/28/2012   Hyperlipidemia 05/28/2012   Past Medical History:  Diagnosis Date   Arthritis    Breast cancer (HCC)    Hyperlipidemia    Hypertension    Osteoporosis    Thyroid disease     Family History  Problem Relation Age of Onset   Breast cancer Sister        dx in her 61s   Cancer Maternal Grandmother        unknown form of cancer   Heart attack Maternal Grandfather    Cancer Cousin        maternal cousin with unknown form of cancer    Past Surgical History:  Procedure Laterality Date   ABDOMINAL HYSTERECTOMY     APPENDECTOMY     BREAST LUMPECTOMY WITH NEEDLE LOCALIZATION Left 10/09/2012   Procedure: BREAST LUMPECTOMY WITH NEEDLE LOCALIZATION;  Surgeon: [name], MD;  Location: [facility];  Service: General;  Laterality: Left;  needle localization at SOLIS 7:30    COLONOSCOPY     fissures     INTRAMEDULLARY (IM) NAIL INTERTROCHANTERIC Left  10/19/2018   Procedure: INTRAMEDULLARY (IM) NAIL INTERTROCHANTRIC;  Surgeon: Tarry Kos, MD;  Location: [facility];  Service: Orthopedics;  Laterality: Left;   MOUTH SURGERY     Social History   Occupational History   Occupation: Retired Theme park manager  Tobacco Use   Smoking status: Never   Smokeless tobacco: Never  Substance and Sexual Activity   Alcohol use: No   Drug use: No   Sexual activity: Not Currently

## 2023-03-04 DIAGNOSIS — I10 Essential (primary) hypertension: Secondary | ICD-10-CM | POA: Diagnosis not present

## 2023-03-04 DIAGNOSIS — Z0000 Encounter for general adult medical examination without abnormal findings: Secondary | ICD-10-CM | POA: Diagnosis not present

## 2023-03-04 DIAGNOSIS — I6523 Occlusion and stenosis of bilateral carotid arteries: Secondary | ICD-10-CM | POA: Diagnosis not present

## 2023-03-04 DIAGNOSIS — E785 Hyperlipidemia, unspecified: Secondary | ICD-10-CM | POA: Diagnosis not present

## 2023-03-04 DIAGNOSIS — F5101 Primary insomnia: Secondary | ICD-10-CM | POA: Diagnosis not present

## 2023-03-04 DIAGNOSIS — E039 Hypothyroidism, unspecified: Secondary | ICD-10-CM | POA: Diagnosis not present

## 2023-03-04 DIAGNOSIS — M810 Age-related osteoporosis without current pathological fracture: Secondary | ICD-10-CM | POA: Diagnosis not present

## 2023-03-04 DIAGNOSIS — I739 Peripheral vascular disease, unspecified: Secondary | ICD-10-CM | POA: Diagnosis not present

## 2023-03-04 DIAGNOSIS — F419 Anxiety disorder, unspecified: Secondary | ICD-10-CM | POA: Diagnosis not present

## 2023-03-04 DIAGNOSIS — R600 Localized edema: Secondary | ICD-10-CM | POA: Diagnosis not present

## 2023-03-06 DIAGNOSIS — I10 Essential (primary) hypertension: Secondary | ICD-10-CM | POA: Diagnosis not present

## 2023-04-23 ENCOUNTER — Ambulatory Visit: Payer: Medicare PPO | Admitting: Orthopaedic Surgery

## 2023-04-23 ENCOUNTER — Other Ambulatory Visit (INDEPENDENT_AMBULATORY_CARE_PROVIDER_SITE_OTHER): Payer: Medicare PPO

## 2023-04-23 DIAGNOSIS — S92301A Fracture of unspecified metatarsal bone(s), right foot, initial encounter for closed fracture: Secondary | ICD-10-CM

## 2023-04-23 NOTE — Progress Notes (Signed)
 Office Visit Note   [name]           Date of Birth: 04/27/1926           MRN: [id number] Visit Date: 04/23/2023              Requested by: Becky Channel, MD [phone] Becky Montoya [address] [place],  [place] [address] PCP: Becky Channel, MD   Assessment & Plan: Visit Diagnoses:  1. Closed nondisplaced fracture of metatarsal bone of right foot, unspecified metatarsal, initial encounter     Plan: Becky Montoya is a 89 year old female with symptomatic bony overgrowth of the right medial cuneiform.  I think a component of her symptoms comes from the swelling and pitting edema.  Recommend compression socks and accommodative shoes.  Follow-up as needed.  Follow-Up Instructions: No follow-ups on file.   Orders:  Orders Placed This Encounter  Procedures   XR Foot Complete Right   No orders of the defined types were placed in this encounter.     Procedures: No procedures performed   Clinical Data: No additional findings.   Subjective: Chief Complaint  Patient presents with   Right Foot - Pain    C/O some right great toe swelling    HPI Becky Montoya comes in today for evaluation of right midfoot pain along the first ray.  She is complaining of swelling.  Cam boot does help when she wears it.  She has been taking Tylenol  for this and it does help with the pain. Review of Systems  Constitutional: Negative.   HENT: Negative.    Eyes: Negative.   Respiratory: Negative.    Cardiovascular: Negative.   Endocrine: Negative.   Musculoskeletal: Negative.   Neurological: Negative.   Hematological: Negative.   Psychiatric/Behavioral: Negative.    All other systems reviewed and are negative.    Objective: Vital Signs: There were no vitals taken for this visit.  Physical Exam Vitals and nursing note reviewed.  Constitutional:      Appearance: She is well-developed.  HENT:     Head: Atraumatic.     Nose: Nose normal.  Eyes:     Extraocular Movements: Extraocular  movements intact.  Cardiovascular:     Pulses: Normal pulses.  Pulmonary:     Effort: Pulmonary effort is normal.  Abdominal:     Palpations: Abdomen is soft.  Musculoskeletal:     Cervical back: Neck supple.  Skin:    General: Skin is warm.     Capillary Refill: Capillary refill takes less than 2 seconds.  Neurological:     Mental Status: She is alert. Mental status is at baseline.  Psychiatric:        Behavior: Behavior normal.        Thought Content: Thought content normal.        Judgment: Judgment normal.     Ortho Exam Examination right foot shows pitting edema starting around the midfoot.  The first TMT joint is tender to palpation.  It feels enlarged as well.  There is no gross motion through the joint. Specialty Comments:  No specialty comments available.  Imaging: XR Foot Complete Right Result Date: 04/23/2023 X-rays of the right foot show degenerative changes throughout the midfoot.  No acute abnormalities.  There is a small bony overgrowth on the medial aspect of the medial cuneiform.    PMFS History: Patient Active Problem List   Diagnosis Date Noted   Stress reaction of shaft of femur, right, initial encounter 11/20/2019  Age-related osteoporosis without current pathological fracture 07/31/2019   Fall at home, initial encounter 10/19/2018   Closed left subtrochanteric femur fracture, initial encounter (HCC) 10/18/2018   Closed left subtrochanteric femur fracture (HCC) 10/18/2018   Ductal carcinoma in situ (DCIS) of left breast 12/27/2016   Mastitis, left, acute 12/19/2012   Malignant neoplasm of upper-outer quadrant of left breast in female, estrogen receptor positive (HCC) 09/15/2012   Chest pain 05/28/2012   Hypothyroidism 05/28/2012   Hypertension 05/28/2012   Hyperlipidemia 05/28/2012   Past Medical History:  Diagnosis Date   Arthritis    Breast cancer (HCC)    Hyperlipidemia    Hypertension    Osteoporosis    Thyroid  disease     Family  History  Problem Relation Age of Onset   Breast cancer Sister        dx in her 88s   Cancer Maternal Grandmother        unknown form of cancer   Heart attack Maternal Grandfather    Cancer Cousin        maternal cousin with unknown form of cancer    Past Surgical History:  Procedure Laterality Date   ABDOMINAL HYSTERECTOMY     APPENDECTOMY     BREAST LUMPECTOMY WITH NEEDLE LOCALIZATION Left 10/09/2012   Procedure: BREAST LUMPECTOMY WITH NEEDLE LOCALIZATION;  Surgeon: Becky GORMAN Curvin DOUGLAS, MD;  Location: [facility];  Service: General;  Laterality: Left;  needle localization at SOLIS 7:30    COLONOSCOPY     fissures     INTRAMEDULLARY (IM) NAIL INTERTROCHANTERIC Left 10/19/2018   Procedure: INTRAMEDULLARY (IM) NAIL INTERTROCHANTRIC;  Surgeon: Becky Kay HERO, MD;  Location: [facility];  Service: Orthopedics;  Laterality: Left;   MOUTH SURGERY     Social History   Occupational History   Occupation: Retired theme park manager  Tobacco Use   Smoking status: Never   Smokeless tobacco: Never  Substance and Sexual Activity   Alcohol  use: No   Drug use: No   Sexual activity: Not Currently

## 2023-05-14 DIAGNOSIS — J329 Chronic sinusitis, unspecified: Secondary | ICD-10-CM | POA: Diagnosis not present

## 2023-05-31 ENCOUNTER — Ambulatory Visit: Payer: Medicare PPO | Admitting: Physician Assistant

## 2023-05-31 ENCOUNTER — Encounter: Payer: Self-pay | Admitting: Physician Assistant

## 2023-05-31 DIAGNOSIS — M1611 Unilateral primary osteoarthritis, right hip: Secondary | ICD-10-CM | POA: Diagnosis not present

## 2023-05-31 NOTE — Progress Notes (Addendum)
Office Visit Note   [name]           Date of Birth: 02/11/1927           MRN: [id number] Visit Date: 05/31/2023              Requested by: Laurann Montana, MD [phone] [address] [place],  [place] [address] PCP: Laurann Montana, MD   Assessment & Plan: Visit Diagnoses:  1. Unilateral primary osteoarthritis, right hip     Plan: Pleasant 89 year old woman with a history of osteoarthritis in her right hip.  She last saw Dr. Shon Baton last summer and had an injection and did not have any recurrence of symptoms.  She has had no fever chills falls.  She began having increased right hip pain.  She does say actually is feeling better today.  She does have the symptoms are the same as they were when she had this in the past.  We will go forward and refer her to Dr. Shon Baton but she is not sure that she will need the injection.  She will try taking Tylenol on a regular basis and should just cancel appointment.  She has no tenderness in her femur pain is all in the groin with weightbearing and actually has gotten better since she made the appointment  Follow-Up Instructions: Return in about 2 years (around 05/30/2025).   Orders:  No orders of the defined types were placed in this encounter.  No orders of the defined types were placed in this encounter.     Procedures: No procedures performed   Clinical Data: No additional findings.   Subjective: Chief Complaint  Patient presents with   Right Hip - Pain    HPI pleasant 89 year old woman with a history of arthritis in her right hip.  She has had injections in the past and done well she comes in today complaining of the same right hip pain no swelling no bruising she denies any fever or chills she did have an injection with Dr. Shon Baton last summer and had improvement  Review of Systems  All other systems reviewed and are negative.    Objective: Vital Signs: There were no vitals taken for this visit.  Physical  Exam Constitutional:      Appearance: Normal appearance.  Pulmonary:     Effort: Pulmonary effort is normal.  Skin:    General: Skin is warm and dry.  Neurological:     General: No focal deficit present.     Mental Status: She is alert and oriented to person, place, and time.     Ortho Exam Right hip no swelling no tenderness she does have no pain with manipulation of the hip more with weightbearing.  She is neurovascularly intact no sign of infection Specialty Comments:  No specialty comments available.  Imaging: No results found.   PMFS History: Patient Active Problem List   Diagnosis Date Noted   Unilateral primary osteoarthritis, right hip 05/31/2023   Stress reaction of shaft of femur, right, initial encounter 11/20/2019   Age-related osteoporosis without current pathological fracture 07/31/2019   Fall at home, initial encounter 10/19/2018   Closed left subtrochanteric femur fracture, initial encounter (HCC) 10/18/2018   Closed left subtrochanteric femur fracture (HCC) 10/18/2018   Ductal carcinoma in situ (DCIS) of left breast 12/27/2016   Mastitis, left, acute 12/19/2012   Malignant neoplasm of upper-outer quadrant of left breast in female, estrogen receptor positive (HCC) 09/15/2012   Chest pain 05/28/2012  Hypothyroidism 05/28/2012   Hypertension 05/28/2012   Hyperlipidemia 05/28/2012   Past Medical History:  Diagnosis Date   Arthritis    Breast cancer (HCC)    Hyperlipidemia    Hypertension    Osteoporosis    Thyroid disease     Family History  Problem Relation Age of Onset   Breast cancer Sister        dx in her 41s   Cancer Maternal Grandmother        unknown form of cancer   Heart attack Maternal Grandfather    Cancer Cousin        maternal cousin with unknown form of cancer    Past Surgical History:  Procedure Laterality Date   ABDOMINAL HYSTERECTOMY     APPENDECTOMY     BREAST LUMPECTOMY WITH NEEDLE LOCALIZATION Left 10/09/2012   Procedure:  BREAST LUMPECTOMY WITH NEEDLE LOCALIZATION;  Surgeon: [name], MD;  Location: [facility];  Service: General;  Laterality: Left;  needle localization at SOLIS 7:30    COLONOSCOPY     fissures     INTRAMEDULLARY (IM) NAIL INTERTROCHANTERIC Left 10/19/2018   Procedure: INTRAMEDULLARY (IM) NAIL INTERTROCHANTRIC;  Surgeon: Tarry Kos, MD;  Location: [facility];  Service: Orthopedics;  Laterality: Left;   MOUTH SURGERY     Social History   Occupational History   Occupation: Retired Theme park manager  Tobacco Use   Smoking status: Never   Smokeless tobacco: Never  Substance and Sexual Activity   Alcohol use: No   Drug use: No   Sexual activity: Not Currently

## 2023-06-11 ENCOUNTER — Other Ambulatory Visit: Payer: Self-pay

## 2023-06-11 ENCOUNTER — Ambulatory Visit: Payer: Medicare PPO | Admitting: Sports Medicine

## 2023-06-11 ENCOUNTER — Encounter: Payer: Self-pay | Admitting: Sports Medicine

## 2023-06-11 DIAGNOSIS — M1611 Unilateral primary osteoarthritis, right hip: Secondary | ICD-10-CM | POA: Diagnosis not present

## 2023-06-11 MED ORDER — METHYLPREDNISOLONE ACETATE 40 MG/ML IJ SUSP
40.0000 mg | INTRAMUSCULAR | Status: AC | PRN
  Administered 2023-06-11: 40 mg via INTRA_ARTICULAR

## 2023-06-11 MED ORDER — LIDOCAINE HCL 1 % IJ SOLN
4.0000 mL | INTRAMUSCULAR | Status: AC | PRN
  Administered 2023-06-11: 4 mL

## 2023-06-11 NOTE — Progress Notes (Signed)
   Procedure Note  Patient: Sharin Grave             Date of Birth: 01-09-1927           MRN: [id number]             Visit Date: 06/11/2023  Procedures: Visit Diagnoses:  1. Unilateral primary osteoarthritis, right hip    Large Joint Inj: R hip joint on 06/11/2023 10:50 AM Indications: pain Details: 22 G 3.5 in needle, ultrasound-guided anterior approach Medications: 4 mL lidocaine 1 %; 40 mg methylPREDNISolone acetate 40 MG/ML Outcome: tolerated well, no immediate complications  Procedure: US-guided intra-articular hip injection, Right After discussion on risks/benefits/indications and informed verbal consent was obtained, a timeout was performed. Patient was lying supine on exam table. The hip was cleaned with betadine and alcohol swabs. Then utilizing ultrasound guidance, the patient's femoral head and neck junction was identified and subsequently injected with 4:1 lidocaine:depomedrol via an in-plane approach with ultrasound visualization of the injectate administered into the hip joint. Patient tolerated procedure well without immediate complications.  Procedure, treatment alternatives, risks and benefits explained, specific risks discussed. Consent was given by the patient. Immediately prior to procedure a time out was called to verify the correct patient, procedure, equipment, support staff and site/side marked as required. Patient was prepped and draped in the usual sterile fashion.     - f/u with me as needed - post injection protocol discussed  Madelyn Brunner, DO Primary Care Sports Medicine Physician  [facility] - Orthopedics  This note was dictated using Dragon naturally speaking software and may contain errors in syntax, spelling, or content which have not been identified prior to signing this note.

## 2023-06-18 DIAGNOSIS — Z85828 Personal history of other malignant neoplasm of skin: Secondary | ICD-10-CM | POA: Diagnosis not present

## 2023-06-18 DIAGNOSIS — Z08 Encounter for follow-up examination after completed treatment for malignant neoplasm: Secondary | ICD-10-CM | POA: Diagnosis not present

## 2023-06-19 DIAGNOSIS — H353132 Nonexudative age-related macular degeneration, bilateral, intermediate dry stage: Secondary | ICD-10-CM | POA: Diagnosis not present

## 2023-07-08 DIAGNOSIS — I10 Essential (primary) hypertension: Secondary | ICD-10-CM | POA: Diagnosis not present

## 2023-07-08 DIAGNOSIS — R0981 Nasal congestion: Secondary | ICD-10-CM | POA: Diagnosis not present

## 2023-07-08 DIAGNOSIS — H9201 Otalgia, right ear: Secondary | ICD-10-CM | POA: Diagnosis not present

## 2023-09-04 DIAGNOSIS — F5101 Primary insomnia: Secondary | ICD-10-CM | POA: Diagnosis not present

## 2023-09-04 DIAGNOSIS — E039 Hypothyroidism, unspecified: Secondary | ICD-10-CM | POA: Diagnosis not present

## 2023-09-04 DIAGNOSIS — R0981 Nasal congestion: Secondary | ICD-10-CM | POA: Diagnosis not present

## 2023-09-04 DIAGNOSIS — M25512 Pain in left shoulder: Secondary | ICD-10-CM | POA: Diagnosis not present

## 2023-09-04 DIAGNOSIS — I10 Essential (primary) hypertension: Secondary | ICD-10-CM | POA: Diagnosis not present

## 2023-09-04 DIAGNOSIS — E785 Hyperlipidemia, unspecified: Secondary | ICD-10-CM | POA: Diagnosis not present

## 2023-09-04 DIAGNOSIS — F419 Anxiety disorder, unspecified: Secondary | ICD-10-CM | POA: Diagnosis not present

## 2023-11-08 ENCOUNTER — Ambulatory Visit: Admitting: Physician Assistant

## 2023-11-08 ENCOUNTER — Other Ambulatory Visit (INDEPENDENT_AMBULATORY_CARE_PROVIDER_SITE_OTHER): Payer: Self-pay

## 2023-11-08 ENCOUNTER — Encounter: Payer: Self-pay | Admitting: Physician Assistant

## 2023-11-08 DIAGNOSIS — M25572 Pain in left ankle and joints of left foot: Secondary | ICD-10-CM

## 2023-11-08 MED ORDER — METHYLPREDNISOLONE 4 MG PO TBPK: ORAL_TABLET | ORAL | 0 refills | Status: DC

## 2023-11-08 NOTE — Progress Notes (Signed)
 Office Visit Note   [name]           Date of Birth: 02/01/1927           MRN: [id number] Visit Date: 11/08/2023              Requested by: Teresa Channel, MD [phone] [address] [place],  [place] [address] PCP: Teresa Channel, MD   Assessment & Plan: Visit Diagnoses:  1. Pain in left ankle and joints of left foot     Plan: Impression is left ankle and foot pain likely from exacerbation of underlying arthritis as well as peroneal tendinitis.  Suspicion is very low for gout or acute fracture based on clinical exam and x-ray.  We have discussed various treatment options to include steroid taper versus steroid injection in addition to cam boot versus ASO brace versus postoperative shoe.  Based on the diffuse pain, she is elected to proceed with steroid taper.  I am almost afraid to place her in a cam boot or postoperative shoe as I do not want to compromise her balance.  She would like to try an ASO brace today.  She will continue to ice and elevate and wear her compression sock.  If her symptoms do not improve over the next 4 weeks, she will follow-up for recheck.  Call with concerns or questions otherwise.  In regards to the fall, she is not symptomatic on exam.  I do not feel the need we need to order x-rays for her right upper lower extremity.  Should she develop any pain to these areas, she will call and let us  know.  Follow-Up Instructions: Return if symptoms worsen or fail to improve.   Orders:  Orders Placed This Encounter  Procedures   XR Ankle Complete Left   Meds ordered this encounter  Medications   methylPREDNISolone  (MEDROL  DOSEPAK) 4 MG TBPK tablet    Sig: Take as directed    Dispense:  21 tablet    Refill:  0      Procedures: No procedures performed   Clinical Data: No additional findings.   Subjective: Chief Complaint  Patient presents with   Left Ankle - Pain    HPI patient is a pleasant 89 year old female who comes in today  with left foot and ankle pain.  Symptoms began about 2 weeks ago.  She denies any injury.  She notes she was watching a movie when she stood at the end and had significant pain in the lateral foot/ankle.  She has had minimal improvement of pain since.  Pain is located to the anterior and lateral ankle into the lateral foot.  She notes a constant ache but increased pain with standing.  She has been using ice, elevation and compression socks.  She has also been taking medication for pain which she recently stopped as she began having diarrhea and was unsure if this was related.  Of note, on her way to x-ray, she stopped to get a sip of water  and when she turned around fell landing on her right side.  She is currently not having any pain to the right side.  Review of Systems as detailed in HPI.  All others reviewed and are negative.   Objective: Vital Signs: There were no vitals taken for this visit.  Physical Exam well-developed well-nourished female no acute distress.  Alert and oriented x 3.  Ortho Exam left foot and ankle exam: She has moderate tenderness along the peroneal  tendon into the proximal fifth metatarsal.  She has moderate tenderness to the anterior ankle.  Painless range of motion.  Right shoulder exam is unremarkable.  Right hip exam is unremarkable.  She is neurovascularly intact distally.  Specialty Comments:  No specialty comments available.  Imaging: XR Ankle Complete Left Result Date: 11/08/2023 X-rays demonstrate diffuse arthritis to the left ankle and foot.  No acute fracture noted.    PMFS History: Patient Active Problem List   Diagnosis Date Noted   Unilateral primary osteoarthritis, right hip 05/31/2023   Stress reaction of shaft of femur, right, initial encounter 11/20/2019   Age-related osteoporosis without current pathological fracture 07/31/2019   Fall at home, initial encounter 10/19/2018   Closed left subtrochanteric femur fracture, initial encounter (HCC)  10/18/2018   Closed left subtrochanteric femur fracture (HCC) 10/18/2018   Ductal carcinoma in situ (DCIS) of left breast 12/27/2016   Mastitis, left, acute 12/19/2012   Malignant neoplasm of upper-outer quadrant of left breast in female, estrogen receptor positive (HCC) 09/15/2012   Chest pain 05/28/2012   Hypothyroidism 05/28/2012   Hypertension 05/28/2012   Hyperlipidemia 05/28/2012   Past Medical History:  Diagnosis Date   Arthritis    Breast cancer (HCC)    Hyperlipidemia    Hypertension    Osteoporosis    Thyroid  disease     Family History  Problem Relation Age of Onset   Breast cancer Sister        dx in her 31s   Cancer Maternal Grandmother        unknown form of cancer   Heart attack Maternal Grandfather    Cancer Cousin        maternal cousin with unknown form of cancer    Past Surgical History:  Procedure Laterality Date   ABDOMINAL HYSTERECTOMY     APPENDECTOMY     BREAST LUMPECTOMY WITH NEEDLE LOCALIZATION Left 10/09/2012   Procedure: BREAST LUMPECTOMY WITH NEEDLE LOCALIZATION;  Surgeon: Deward GORMAN Curvin DOUGLAS, MD;  Location: [facility];  Service: General;  Laterality: Left;  needle localization at SOLIS 7:30    COLONOSCOPY     fissures     INTRAMEDULLARY (IM) NAIL INTERTROCHANTERIC Left 10/19/2018   Procedure: INTRAMEDULLARY (IM) NAIL INTERTROCHANTRIC;  Surgeon: Jerri Kay HERO, MD;  Location: [facility];  Service: Orthopedics;  Laterality: Left;   MOUTH SURGERY     Social History   Occupational History   Occupation: Retired Theme park manager  Tobacco Use   Smoking status: Never   Smokeless tobacco: Never  Substance and Sexual Activity   Alcohol  use: No   Drug use: No   Sexual activity: Not Currently

## 2023-11-27 DIAGNOSIS — J329 Chronic sinusitis, unspecified: Secondary | ICD-10-CM | POA: Diagnosis not present

## 2023-11-27 DIAGNOSIS — R0981 Nasal congestion: Secondary | ICD-10-CM | POA: Diagnosis not present

## 2023-11-27 DIAGNOSIS — R448 Other symptoms and signs involving general sensations and perceptions: Secondary | ICD-10-CM | POA: Diagnosis not present

## 2023-11-27 NOTE — Progress Notes (Signed)
 Otolaryngology Clinic Note  HPI:    Becky Montoya is a 89 y.o. female who presents as a new patient.  Ms. Strauser presents today for evaluation of nasal congestion and recurrent sinus infection..  Her daughter accompanies her on today's visit.  She reports she has had at least 3 infections this year.  She has been treated her PCP with several rounds of antibiotics.  She continues to have facial pressure and discomfort.  She is currently using Flonase spray and Claritin  and saline  PMH/Meds/All/SocHx/FamHx/ROS:   Medical History[1]  Surgical History[2]  No family history of bleeding disorders, wound healing problems or difficulty with anesthesia.      Current Medications[3]  A complete ROS was performed with pertinent positives/negatives noted in the HPI. The remainder of the ROS are negative.    Physical Exam:    BP (!) 177/83   Pulse 94   Temp 96 F (35.6 C) (Temporal)   Resp 18   Ht 1.575 m (5' 2)   Wt 54.9 kg (121 lb)   BMI 22.13 kg/m   Constitutional:  Patient appears well-nourished and well-developed. No acute distress.   Head/Face: Facial features are symmetric. Skull is normocephalic. Hair and scalp are normal. Normal temporal artery pulses. TMJ shows no joint deformity swelling or erythema.   Eyes: Pupils are equal, round and reactive to light. Conjunctiva and lids are normal. Normal extraocular mobility. Normal vision by patient report.   Ears:     Right: Pinna and external meatus normal, normal ear canal skin and caliber without excessive cerumen or drainage. Tympanic membranes intact without effusion or infection. Hearing normal.    Left: Pinna and external meatus normal, normal ear canal skin and caliber without excessive cerumen or drainage. Tympanic membranes intact without effusion or infection. Hearing normal.   Nose/Sinus/Nasopharynx: Septum is mildly deviated to the right Yellowish crust anteriorly Narrow nasal valves that collapse somewhat with  inspiration Normal inferior turbinates.    Oral cavity/Oropharynx: Lips normal, teeth and gums normal with good dentition, normal oral vestibule. Normal floor of mouth, tongue and oral mucosa, no mucosal lesions, ulcer or mass, normal tongue mobility.  Hard and soft palate normal with normal mobility. One plus tonsils, no erythema or exudate. Base of tongue, retromolar trigone and oral pharynx normal. Normal sensation, mobility and gag.   Neck: No cervical lymphadenopathy, mass or swelling. Salivary glands normal to palpation without swelling, erythema or mass. Normal facial nerve function. Normal thyroid  gland palpation.   Neurological: Alert and oriented to self, place and time.  Normal reflexes and motor skills, balance and coordination.   Psychiatric: No unusual anxiety or evidence of depression. Appropriate affect.     Independent Review of Additional Tests or Records:  None  Procedures:  Procedure Note -flexible nasal Endoscopy   Risks/benefits and possible complications of this procedure were discussed in detail and the patient understood and agreed to proceed.  The nose was sprayed with oxymetazoline and 4% lidocaine . With the patient in the upright position, the flexible endoscope was inserted into the nasal passage bilaterlly.  Where visible, nasal secretions and mucosal crusting were removed with suction. The overall appearance of the nasal cavity and paranasal sinuses were noted and the findings are described below.  Findings: Some nasal crusting anteriorly.  No posterior purulence.  No polyps.  No old or fresh blood.     The patient tolerated the procedure without difficulty and was discharged in stable condition.  Alm RAMAN. Spainhour, PA-C [facility]  Impression & Plans:   1) recurrent sinusitis 2) nasal septal deviation 3) nasal valve collapse 4) facial pressure   Continue fluticasone nasal spray daily OTC antihistamine daily Saline  irrigations Mupirocin ointment 3 times daily Return to clinic 3 to 4 weeks with CT scan of sinuses first Alm RAMAN. Spainhour, PA-C [facility]        [1] History reviewed. No pertinent past medical history. [2] History reviewed. No pertinent surgical history. [3]  Current Outpatient Medications:  .  alpha tocopherol (VITAMIN E ) 268 mg (400 unit) capsule, Take 400 Units by mouth., Disp: , Rfl:  .  aspirin  81 mg EC tablet, Take 81 mg by mouth., Disp: , Rfl:  .  hydroCHLOROthiazide  (HYDRODIURIL ) 25 mg tablet, Take 25 mg by mouth every morning., Disp: , Rfl:  .  levothyroxine  (SYNTHROID ) 50 mcg tablet, levothyroxine  50 mcg tablet, Disp: , Rfl:  .  LORazepam (ATIVAN) 0.5 mg tablet, TAKE ONE-HALF TO ONE TABLET ONCE DAILY AS NEEDED FOR ANXIETY, Disp: , Rfl:  .  losartan -hydroCHLOROthiazide  (HYZAAR) 100-12.5 mg per tablet, losartan  100 mg-hydrochlorothiazide  12.5 mg tablet, Disp: , Rfl:  .  lutein 20 mg cap, Take 1 capsule by mouth., Disp: , Rfl:  .  Oyster Shell Calcium -Vit D3 500 mg-5 mcg (200 unit) per tablet, 1 tablet., Disp: , Rfl:  .  simvastatin  (ZOCOR ) 20 mg tablet, simvastatin  20 mg tablet, Disp: , Rfl:  .  valsartan (DIOVAN) 320 mg tablet, Take 320 mg by mouth nightly., Disp: , Rfl:  .  magnesium  250 mg tablet, Take 250 mg by mouth. (Patient not taking: Reported on 11/27/2023), Disp: , Rfl:

## 2023-11-29 ENCOUNTER — Encounter (HOSPITAL_COMMUNITY): Payer: Self-pay

## 2023-11-29 ENCOUNTER — Emergency Department (HOSPITAL_COMMUNITY)
Admission: EM | Admit: 2023-11-29 | Discharge: 2023-11-29 | Disposition: A | Discharge status: Home / Self Care | Attending: Emergency Medicine | Admitting: Emergency Medicine

## 2023-11-29 ENCOUNTER — Other Ambulatory Visit: Payer: Self-pay

## 2023-11-29 DIAGNOSIS — Z79899 Other long term (current) drug therapy: Secondary | ICD-10-CM | POA: Insufficient documentation

## 2023-11-29 DIAGNOSIS — R531 Weakness: Secondary | ICD-10-CM | POA: Insufficient documentation

## 2023-11-29 DIAGNOSIS — Z853 Personal history of malignant neoplasm of breast: Secondary | ICD-10-CM | POA: Insufficient documentation

## 2023-11-29 DIAGNOSIS — E871 Hypo-osmolality and hyponatremia: Secondary | ICD-10-CM | POA: Diagnosis not present

## 2023-11-29 DIAGNOSIS — E039 Hypothyroidism, unspecified: Secondary | ICD-10-CM | POA: Diagnosis not present

## 2023-11-29 DIAGNOSIS — Z7982 Long term (current) use of aspirin: Secondary | ICD-10-CM | POA: Diagnosis not present

## 2023-11-29 DIAGNOSIS — I10 Essential (primary) hypertension: Secondary | ICD-10-CM | POA: Insufficient documentation

## 2023-11-29 LAB — URINALYSIS, W/ REFLEX TO CULTURE (INFECTION SUSPECTED)
Bacteria, UA: NONE SEEN
Bilirubin Urine: NEGATIVE
Glucose, UA: NEGATIVE mg/dL
Hgb urine dipstick: NEGATIVE
Ketones, ur: NEGATIVE mg/dL
Leukocytes,Ua: NEGATIVE
Nitrite: NEGATIVE
Protein, ur: NEGATIVE mg/dL
Specific Gravity, Urine: 1.005 (ref 1.005–1.030)
pH: 7 (ref 5.0–8.0)

## 2023-11-29 LAB — CBC WITH DIFFERENTIAL/PLATELET
Abs Immature Granulocytes: 0.03 K/uL (ref 0.00–0.07)
Basophils Absolute: 0 K/uL (ref 0.0–0.1)
Basophils Relative: 1 %
Eosinophils Absolute: 0 K/uL (ref 0.0–0.5)
Eosinophils Relative: 0 %
HCT: 38.1 % (ref 36.0–46.0)
Hemoglobin: 13.5 g/dL (ref 12.0–15.0)
Immature Granulocytes: 0 %
Lymphocytes Relative: 16 %
Lymphs Abs: 1.1 K/uL (ref 0.7–4.0)
MCH: 31.8 pg (ref 26.0–34.0)
MCHC: 35.4 g/dL (ref 30.0–36.0)
MCV: 89.9 fL (ref 80.0–100.0)
Monocytes Absolute: 0.8 K/uL (ref 0.1–1.0)
Monocytes Relative: 11 %
Neutro Abs: 4.9 K/uL (ref 1.7–7.7)
Neutrophils Relative %: 72 %
Platelets: 233 K/uL (ref 150–400)
RBC: 4.24 MIL/uL (ref 3.87–5.11)
RDW: 12.1 % (ref 11.5–15.5)
WBC: 6.9 K/uL (ref 4.0–10.5)
nRBC: 0 % (ref 0.0–0.2)

## 2023-11-29 LAB — COMPREHENSIVE METABOLIC PANEL WITH GFR
ALT: 21 U/L (ref 0–44)
AST: 24 U/L (ref 15–41)
Albumin: 3.8 g/dL (ref 3.5–5.0)
Alkaline Phosphatase: 69 U/L (ref 38–126)
Anion gap: 12 (ref 5–15)
BUN: 12 mg/dL (ref 8–23)
CO2: 25 mmol/L (ref 22–32)
Calcium: 8.9 mg/dL (ref 8.9–10.3)
Chloride: 91 mmol/L — ABNORMAL LOW (ref 98–111)
Creatinine, Ser: 0.6 mg/dL (ref 0.44–1.00)
GFR, Estimated: >60 mL/min (ref >60)
Glucose, Bld: 115 mg/dL — ABNORMAL HIGH (ref 70–99)
Potassium: 3 mmol/L — ABNORMAL LOW (ref 3.5–5.1)
Sodium: 128 mmol/L — ABNORMAL LOW (ref 135–145)
Total Bilirubin: 0.9 mg/dL (ref 0.0–1.2)
Total Protein: 6.3 g/dL — ABNORMAL LOW (ref 6.5–8.1)

## 2023-11-29 MED ORDER — POTASSIUM CHLORIDE 20 MEQ PO PACK
60.0000 meq | PACK | Freq: Once | ORAL | Status: AC
  Administered 2023-11-29: 60 meq via ORAL
  Filled 2023-11-29: qty 3

## 2023-11-29 MED ORDER — SODIUM CHLORIDE 0.9 % IV BOLUS
1000.0000 mL | Freq: Once | INTRAVENOUS | Status: AC
  Administered 2023-11-29: 1000 mL via INTRAVENOUS

## 2023-11-29 NOTE — Discharge Instructions (Addendum)
 We evaluated you for your fatigue and high blood pressure.  Your testing in the emergency department was overall reassuring.  We did not see any sign of a UTI or infection.  Your sodium level and potassium level were slightly low.  This could be due to eating less or decrease salt intake.  We gave you some fluids and potassium in the emergency department.  This may be the cause of your fatigue.  Your blood pressure was elevated in the emergency department, but we do not see any sign of any dangerous effect of high blood pressure.  Your examination was reassuring.  It is not important to get this under control so I would recommend that you follow-up with your primary doctor next week.  It is generally better if your primary doctor makes adjustments to your blood pressure medications since they manage this over the long-term.  They can also recheck your potassium and sodium levels.  If you develop any new or worsening symptoms such as severe headaches, vision changes, numbness or tingling, weakness, trouble walking, chest pain, difficulty breathing, leg swelling, confusion, or any other new symptoms, please return to the emergency department for reassessment.

## 2023-11-29 NOTE — ED Triage Notes (Signed)
 Pt BIB [facility] from home C/O weakness X 2 weeks.

## 2023-11-29 NOTE — ED Provider Notes (Signed)
 [place] EMERGENCY DEPARTMENT AT [facility] Provider Note  CSN: [id number] Arrival date & time: 11/29/23 1807  Chief Complaint(s) Weakness  HPI Becky Montoya is a 89 y.o. female with history of hypertension, hyperlipidemia presenting to the emergency department with fatigue.  Patient reports that she has had high blood pressure for the past weeks, otherwise has just had some mild generalized weakness.  She denies any other focal complaints.  Denies any headaches, nausea, vomiting, numbness or tingling, weakness, trouble swallowing, painful urination, cough, runny nose, sore throat, abdominal pain, chest pain, difficulty breathing, trouble swallowing, visual changes, facial droop, neck pain or stiffness, diarrhea, falls, or really any other symptoms.  She decided to come to the hospital today because her nurse at her facility asked her to come.  She has not seen her primary doctor for this yet.   Past Medical History Past Medical History:  Diagnosis Date   Arthritis    Breast cancer (HCC)    Hyperlipidemia    Hypertension    Osteoporosis    Thyroid  disease    Patient Active Problem List   Diagnosis Date Noted   Unilateral primary osteoarthritis, right hip 05/31/2023   Stress reaction of shaft of femur, right, initial encounter 11/20/2019   Age-related osteoporosis without current pathological fracture 07/31/2019   Fall at home, initial encounter 10/19/2018   Closed left subtrochanteric femur fracture, initial encounter (HCC) 10/18/2018   Closed left subtrochanteric femur fracture (HCC) 10/18/2018   Ductal carcinoma in situ (DCIS) of left breast 12/27/2016   Mastitis, left, acute 12/19/2012   Malignant neoplasm of upper-outer quadrant of left breast in female, estrogen receptor positive (HCC) 09/15/2012   Chest pain 05/28/2012   Hypothyroidism 05/28/2012   Hypertension 05/28/2012   Hyperlipidemia 05/28/2012   Home Medication(s) Prior to Admission medications    Medication Sig Start Date End Date Taking? Authorizing Provider  aspirin  EC 81 MG tablet Take 81 mg by mouth every morning.    [provider]  calcium -vitamin D  (OSCAL WITH D) 500-200 MG-UNIT TABS tablet TAKE 1 TABLET BY MOUTH THREE TIMES A DAY 09/28/19   Jerri Kay CHRISTELLA, MD  denosumab  (PROLIA ) 60 MG/ML SOLN injection Inject 60 mg into the skin every 6 (six) months. Administer in upper arm, thigh, or abdomen    [provider]  levothyroxine  (SYNTHROID , LEVOTHROID) 50 MCG tablet Take 50 mcg by mouth every morning.    [provider]  losartan -hydrochlorothiazide  (HYZAAR) 100-12.5 MG per tablet Take 1 tablet by mouth every morning.    [provider]  Magnesium  250 MG TABS Take 250 mg by mouth daily.    [provider]  methylPREDNISolone  (MEDROL  DOSEPAK) 4 MG TBPK tablet Take as directed 11/08/23   Jule Ronal CROME, PA-C  Misc Natural Products (LUTEIN 20) CAPS Take 1 capsule by mouth every morning.    [provider]  Omega-3 Fatty Acids (OMEGA 3 PO) Take 1 capsule by mouth daily.    [provider]  polyethylene glycol (MIRALAX  / GLYCOLAX ) 17 g packet Take 17 g by mouth daily as needed for mild constipation. 10/21/18   Amin, Ankit C, MD  predniSONE  (STERAPRED UNI-PAK 21 TAB) 10 MG (21) TBPK tablet Take as directed 11/20/19   Jerri Kay CHRISTELLA, MD  senna-docusate (SENOKOT-S) 8.6-50 MG tablet Take 2 tablets by mouth at bedtime as needed for mild constipation or moderate constipation. 10/21/18   Amin, Ankit C, MD  simvastatin  (ZOCOR ) 20 MG tablet Take 20 mg by mouth every  evening.     [provider]  vitamin E  400 UNIT capsule Take 400 Units by mouth every evening.    [provider]                                                                                                                                    Past Surgical History Past Surgical History:  Procedure Laterality Date   ABDOMINAL HYSTERECTOMY     APPENDECTOMY      BREAST LUMPECTOMY WITH NEEDLE LOCALIZATION Left 10/09/2012   Procedure: BREAST LUMPECTOMY WITH NEEDLE LOCALIZATION;  Surgeon: Deward GORMAN Curvin DOUGLAS, MD;  Location: [facility];  Service: General;  Laterality: Left;  needle localization at SOLIS 7:30    COLONOSCOPY     fissures     INTRAMEDULLARY (IM) NAIL INTERTROCHANTERIC Left 10/19/2018   Procedure: INTRAMEDULLARY (IM) NAIL INTERTROCHANTRIC;  Surgeon: Jerri Kay HERO, MD;  Location: [facility];  Service: Orthopedics;  Laterality: Left;   MOUTH SURGERY     Family History Family History  Problem Relation Age of Onset   Breast cancer Sister        dx in her 60s   Cancer Maternal Grandmother        unknown form of cancer   Heart attack Maternal Grandfather    Cancer Cousin        maternal cousin with unknown form of cancer    Social History Social History   Tobacco Use   Smoking status: Never   Smokeless tobacco: Never  Substance Use Topics   Alcohol  use: No   Drug use: No   Allergies Wellbutrin [bupropion]  Review of Systems Review of Systems  All other systems reviewed and are negative.   Physical Exam Vital Signs  I have reviewed the triage vital signs BP (!) 174/72   Pulse 81   Temp 97.8 F (36.6 C) (Oral)   Resp 20   Ht 5' 2 (1.575 m)   Wt 59 kg   SpO2 97%   BMI 23.78 kg/m  Physical Exam Vitals and nursing note reviewed.  Constitutional:      General: She is not in acute distress.    Appearance: She is well-developed.  HENT:     Head: Normocephalic and atraumatic.     Mouth/Throat:     Mouth: Mucous membranes are moist.  Eyes:     Pupils: Pupils are equal, round, and reactive to light.  Cardiovascular:     Rate and Rhythm: Normal rate and regular rhythm.     Heart sounds: No murmur heard. Pulmonary:     Effort: Pulmonary effort is normal. No respiratory distress.     Breath sounds: Normal breath sounds.  Abdominal:     General: Abdomen is flat.     Palpations: Abdomen is soft.      Tenderness: There is no abdominal tenderness.  Musculoskeletal:        General:  No tenderness.     Right lower leg: No edema.     Left lower leg: No edema.  Skin:    General: Skin is warm and dry.  Neurological:     General: No focal deficit present.     Mental Status: She is alert and oriented to person, place, and time. Mental status is at baseline.     Comments: Cranial nerves II through XII intact, strength 5 out of 5 in the bilateral upper and lower extremities, no sensory deficit to light touch, no dysmetria on finger-nose-finger testing  Psychiatric:        Mood and Affect: Mood normal.        Behavior: Behavior normal.     ED Results and Treatments Labs (all labs ordered are listed, but only abnormal results are displayed) Labs Reviewed  COMPREHENSIVE METABOLIC PANEL WITH GFR - Abnormal; Notable for the following components:      Result Value   Sodium 128 (*)    Potassium 3.0 (*)    Chloride 91 (*)    Glucose, Bld 115 (*)    Total Protein 6.3 (*)    All other components within normal limits  URINALYSIS, W/ REFLEX TO CULTURE (INFECTION SUSPECTED) - Abnormal; Notable for the following components:   Color, Urine STRAW (*)    All other components within normal limits  CBC WITH DIFFERENTIAL/PLATELET                                                                                                                          Radiology No results found.  Pertinent labs & imaging results that were available during my care of the patient were reviewed by me and considered in my medical decision making (see MDM for details).  Medications Ordered in ED Medications  sodium chloride  0.9 % bolus 1,000 mL (0 mLs Intravenous Stopped 11/29/23 2246)  potassium chloride  (KLOR-CON ) packet 60 mEq (60 mEq Oral Given 11/29/23 2124)                                                                                                                                      Procedures Procedures  (including critical care time)  Medical Decision Making / ED Course   MDM:  89 year old presenting to the emergency department with generalized weakness, high blood pressure.  Patient is overall very well-appearing for her 89 year old.  Her examination  is nonfocal.  She has no neurologic deficit.  Given generalized weakness, will check basic labs, urinalysis.  Blood pressure is somewhat elevated however patient really has no symptoms other than some mild fatigue which may not be related.  No neurologic deficit or neurologic symptoms, chest pain, shortness of breath, leg swelling to suggest any process like hypertensive emergency, stroke, or other dangerous process.  If her medical workup is reassuring anticipate discharge with outpatient PMD follow-up.  Clinical Course as of 11/30/23 1438  Fri Nov 29, 2023  2300 Workup overall reassuring, labs with mild hypokalemia, hyponatremia, may be contributing to weakness. No signs of UTI. Will give some NS and supplemental potassium. Discussed with son/daughter, patient has also stopped salting food out of concern for HTN which likely is contributing to hyponatremia. BP remains somewhat elevated but patient denies any other concerning symptoms. Don't think patient needs admission at this time, but discussed return precautions with patient/family. Advised follow up with PMD next week for BP med titration and re-check of electrolytes. Will discharge patient to home. All questions answered. Patient comfortable with plan of discharge. Return precautions discussed with patient and specified on the after visit summary.  [WS]    Clinical Course User Index [WS] Francesca Elsie CROME, MD     Additional history obtained: -Additional history obtained from family and ems -External records from outside source obtained and reviewed including: Chart review including previous notes, labs, imaging, consultation notes including prior notes     Lab Tests: -I ordered, reviewed, and interpreted labs.   The pertinent results include:   Labs Reviewed  COMPREHENSIVE METABOLIC PANEL WITH GFR - Abnormal; Notable for the following components:      Result Value   Sodium 128 (*)    Potassium 3.0 (*)    Chloride 91 (*)    Glucose, Bld 115 (*)    Total Protein 6.3 (*)    All other components within normal limits  URINALYSIS, W/ REFLEX TO CULTURE (INFECTION SUSPECTED) - Abnormal; Notable for the following components:   Color, Urine STRAW (*)    All other components within normal limits  CBC WITH DIFFERENTIAL/PLATELET    Notable for mild hyponatremia, mild hypokalemia  EKG   EKG Interpretation Date/Time:  Friday November 29 2023 19:34:25 EDT Ventricular Rate:  82 PR Interval:  159 QRS Duration:  82 QT Interval:  375 QTC Calculation: 438 R Axis:   81  Text Interpretation: Sinus rhythm Biatrial enlargement Borderline right axis deviation Nonspecific T wave abnormality No significant change since last tracing Confirmed by Francesca Elsie ([id number]) on 11/29/2023 8:33:36 PM         Medicines ordered and prescription drug management: Meds ordered this encounter  Medications   sodium chloride  0.9 % bolus 1,000 mL   potassium chloride  (KLOR-CON ) packet 60 mEq    -I have reviewed the patients home medicines and have made adjustments as needed  Cardiac Monitoring: The patient was maintained on a cardiac monitor.  I personally viewed and interpreted the cardiac monitored which showed an underlying rhythm of: NSR   Reevaluation: After the interventions noted above, I reevaluated the patient and found that their symptoms have improved  Co morbidities that complicate the patient evaluation  Past Medical History:  Diagnosis Date   Arthritis    Breast cancer (HCC)    Hyperlipidemia    Hypertension    Osteoporosis    Thyroid  disease       Dispostion: Disposition decision including need for hospitalization was  considered,  and patient discharged from emergency department.    Final Clinical Impression(s) / ED Diagnoses Final diagnoses:  Weakness  Hypertension, unspecified type     This chart was dictated using voice recognition software.  Despite best efforts to proofread,  errors can occur which can change the documentation meaning.    Francesca Elsie CROME, MD 11/30/23 [phone]

## 2023-12-04 DIAGNOSIS — R634 Abnormal weight loss: Secondary | ICD-10-CM | POA: Diagnosis not present

## 2023-12-04 DIAGNOSIS — E785 Hyperlipidemia, unspecified: Secondary | ICD-10-CM | POA: Diagnosis not present

## 2023-12-04 DIAGNOSIS — I10 Essential (primary) hypertension: Secondary | ICD-10-CM | POA: Diagnosis not present

## 2023-12-04 DIAGNOSIS — E871 Hypo-osmolality and hyponatremia: Secondary | ICD-10-CM | POA: Diagnosis not present

## 2023-12-04 DIAGNOSIS — M7989 Other specified soft tissue disorders: Secondary | ICD-10-CM | POA: Diagnosis not present

## 2023-12-05 ENCOUNTER — Ambulatory Visit
Admission: RE | Admit: 2023-12-05 | Discharge: 2023-12-05 | Disposition: A | Discharge status: Home / Self Care | Source: Ambulatory Visit | Attending: Family Medicine | Admitting: Family Medicine

## 2023-12-05 ENCOUNTER — Other Ambulatory Visit: Payer: Self-pay | Admitting: Family Medicine

## 2023-12-05 DIAGNOSIS — M7989 Other specified soft tissue disorders: Secondary | ICD-10-CM

## 2023-12-05 DIAGNOSIS — R634 Abnormal weight loss: Secondary | ICD-10-CM | POA: Diagnosis not present

## 2023-12-05 DIAGNOSIS — E871 Hypo-osmolality and hyponatremia: Secondary | ICD-10-CM

## 2024-01-06 ENCOUNTER — Other Ambulatory Visit: Payer: Self-pay | Admitting: Nurse Practitioner

## 2024-01-06 ENCOUNTER — Ambulatory Visit
Admission: RE | Admit: 2024-01-06 | Discharge: 2024-01-06 | Disposition: A | Discharge status: Home / Self Care | Source: Ambulatory Visit | Attending: Nurse Practitioner | Admitting: Nurse Practitioner

## 2024-01-06 DIAGNOSIS — K5900 Constipation, unspecified: Secondary | ICD-10-CM

## 2024-01-08 DIAGNOSIS — R0981 Nasal congestion: Secondary | ICD-10-CM | POA: Diagnosis not present

## 2024-01-08 DIAGNOSIS — J320 Chronic maxillary sinusitis: Secondary | ICD-10-CM | POA: Diagnosis not present

## 2024-01-08 DIAGNOSIS — R448 Other symptoms and signs involving general sensations and perceptions: Secondary | ICD-10-CM | POA: Diagnosis not present

## 2024-01-08 DIAGNOSIS — J329 Chronic sinusitis, unspecified: Secondary | ICD-10-CM | POA: Diagnosis not present

## 2024-01-08 DIAGNOSIS — J342 Deviated nasal septum: Secondary | ICD-10-CM | POA: Diagnosis not present

## 2024-01-12 DIAGNOSIS — E876 Hypokalemia: Secondary | ICD-10-CM | POA: Diagnosis not present

## 2024-01-12 DIAGNOSIS — E871 Hypo-osmolality and hyponatremia: Secondary | ICD-10-CM | POA: Diagnosis not present

## 2024-01-12 DIAGNOSIS — E785 Hyperlipidemia, unspecified: Secondary | ICD-10-CM | POA: Diagnosis not present

## 2024-01-12 DIAGNOSIS — D696 Thrombocytopenia, unspecified: Secondary | ICD-10-CM | POA: Diagnosis not present

## 2024-01-12 DIAGNOSIS — J309 Allergic rhinitis, unspecified: Secondary | ICD-10-CM | POA: Diagnosis not present

## 2024-01-12 DIAGNOSIS — F419 Anxiety disorder, unspecified: Secondary | ICD-10-CM | POA: Diagnosis not present

## 2024-01-12 DIAGNOSIS — M810 Age-related osteoporosis without current pathological fracture: Secondary | ICD-10-CM | POA: Diagnosis not present

## 2024-01-20 DIAGNOSIS — E876 Hypokalemia: Secondary | ICD-10-CM | POA: Diagnosis not present

## 2024-01-28 DIAGNOSIS — M810 Age-related osteoporosis without current pathological fracture: Secondary | ICD-10-CM | POA: Diagnosis not present

## 2024-01-28 DIAGNOSIS — F419 Anxiety disorder, unspecified: Secondary | ICD-10-CM | POA: Diagnosis not present

## 2024-01-28 DIAGNOSIS — E785 Hyperlipidemia, unspecified: Secondary | ICD-10-CM | POA: Diagnosis not present

## 2024-01-28 DIAGNOSIS — E039 Hypothyroidism, unspecified: Secondary | ICD-10-CM | POA: Diagnosis not present

## 2024-01-28 DIAGNOSIS — D696 Thrombocytopenia, unspecified: Secondary | ICD-10-CM | POA: Diagnosis not present

## 2024-01-28 DIAGNOSIS — J309 Allergic rhinitis, unspecified: Secondary | ICD-10-CM | POA: Diagnosis not present

## 2024-01-28 DIAGNOSIS — E871 Hypo-osmolality and hyponatremia: Secondary | ICD-10-CM | POA: Diagnosis not present

## 2024-01-28 DIAGNOSIS — E876 Hypokalemia: Secondary | ICD-10-CM | POA: Diagnosis not present

## 2024-01-31 DIAGNOSIS — F419 Anxiety disorder, unspecified: Secondary | ICD-10-CM | POA: Diagnosis not present

## 2024-02-03 DIAGNOSIS — I10 Essential (primary) hypertension: Secondary | ICD-10-CM | POA: Diagnosis not present

## 2024-02-03 DIAGNOSIS — E876 Hypokalemia: Secondary | ICD-10-CM | POA: Diagnosis not present

## 2024-02-03 DIAGNOSIS — J309 Allergic rhinitis, unspecified: Secondary | ICD-10-CM | POA: Diagnosis not present

## 2024-02-03 DIAGNOSIS — M810 Age-related osteoporosis without current pathological fracture: Secondary | ICD-10-CM | POA: Diagnosis not present

## 2024-02-03 DIAGNOSIS — E785 Hyperlipidemia, unspecified: Secondary | ICD-10-CM | POA: Diagnosis not present

## 2024-02-03 DIAGNOSIS — E039 Hypothyroidism, unspecified: Secondary | ICD-10-CM | POA: Diagnosis not present

## 2024-02-03 DIAGNOSIS — E871 Hypo-osmolality and hyponatremia: Secondary | ICD-10-CM | POA: Diagnosis not present

## 2024-02-03 DIAGNOSIS — F419 Anxiety disorder, unspecified: Secondary | ICD-10-CM | POA: Diagnosis not present

## 2024-02-11 DIAGNOSIS — F419 Anxiety disorder, unspecified: Secondary | ICD-10-CM | POA: Diagnosis not present

## 2024-02-11 DIAGNOSIS — E785 Hyperlipidemia, unspecified: Secondary | ICD-10-CM | POA: Diagnosis not present

## 2024-02-11 DIAGNOSIS — E871 Hypo-osmolality and hyponatremia: Secondary | ICD-10-CM | POA: Diagnosis not present

## 2024-02-11 DIAGNOSIS — E876 Hypokalemia: Secondary | ICD-10-CM | POA: Diagnosis not present

## 2024-02-11 DIAGNOSIS — M810 Age-related osteoporosis without current pathological fracture: Secondary | ICD-10-CM | POA: Diagnosis not present

## 2024-02-11 DIAGNOSIS — I10 Essential (primary) hypertension: Secondary | ICD-10-CM | POA: Diagnosis not present

## 2024-02-11 DIAGNOSIS — D696 Thrombocytopenia, unspecified: Secondary | ICD-10-CM | POA: Diagnosis not present

## 2024-02-11 DIAGNOSIS — J309 Allergic rhinitis, unspecified: Secondary | ICD-10-CM | POA: Diagnosis not present

## 2024-02-11 DIAGNOSIS — E039 Hypothyroidism, unspecified: Secondary | ICD-10-CM | POA: Diagnosis not present

## 2024-02-17 DIAGNOSIS — N898 Other specified noninflammatory disorders of vagina: Secondary | ICD-10-CM | POA: Diagnosis not present

## 2024-02-17 DIAGNOSIS — F5101 Primary insomnia: Secondary | ICD-10-CM | POA: Diagnosis not present

## 2024-02-17 DIAGNOSIS — E039 Hypothyroidism, unspecified: Secondary | ICD-10-CM | POA: Diagnosis not present

## 2024-02-17 DIAGNOSIS — F419 Anxiety disorder, unspecified: Secondary | ICD-10-CM | POA: Diagnosis not present

## 2024-02-17 DIAGNOSIS — R0981 Nasal congestion: Secondary | ICD-10-CM | POA: Diagnosis not present

## 2024-02-17 DIAGNOSIS — E785 Hyperlipidemia, unspecified: Secondary | ICD-10-CM | POA: Diagnosis not present

## 2024-02-17 DIAGNOSIS — I10 Essential (primary) hypertension: Secondary | ICD-10-CM | POA: Diagnosis not present

## 2024-02-17 DIAGNOSIS — M810 Age-related osteoporosis without current pathological fracture: Secondary | ICD-10-CM | POA: Diagnosis not present

## 2024-02-19 DIAGNOSIS — E876 Hypokalemia: Secondary | ICD-10-CM | POA: Diagnosis not present

## 2024-02-19 DIAGNOSIS — J309 Allergic rhinitis, unspecified: Secondary | ICD-10-CM | POA: Diagnosis not present

## 2024-02-19 DIAGNOSIS — E039 Hypothyroidism, unspecified: Secondary | ICD-10-CM | POA: Diagnosis not present

## 2024-02-19 DIAGNOSIS — F419 Anxiety disorder, unspecified: Secondary | ICD-10-CM | POA: Diagnosis not present

## 2024-02-19 DIAGNOSIS — E871 Hypo-osmolality and hyponatremia: Secondary | ICD-10-CM | POA: Diagnosis not present

## 2024-02-19 DIAGNOSIS — D696 Thrombocytopenia, unspecified: Secondary | ICD-10-CM | POA: Diagnosis not present

## 2024-02-19 DIAGNOSIS — M810 Age-related osteoporosis without current pathological fracture: Secondary | ICD-10-CM | POA: Diagnosis not present

## 2024-02-19 DIAGNOSIS — E785 Hyperlipidemia, unspecified: Secondary | ICD-10-CM | POA: Diagnosis not present

## 2024-03-02 DIAGNOSIS — L821 Other seborrheic keratosis: Secondary | ICD-10-CM | POA: Diagnosis not present

## 2024-03-02 DIAGNOSIS — Z85828 Personal history of other malignant neoplasm of skin: Secondary | ICD-10-CM | POA: Diagnosis not present

## 2024-03-02 DIAGNOSIS — Z08 Encounter for follow-up examination after completed treatment for malignant neoplasm: Secondary | ICD-10-CM | POA: Diagnosis not present

## 2024-03-02 DIAGNOSIS — D225 Melanocytic nevi of trunk: Secondary | ICD-10-CM | POA: Diagnosis not present

## 2024-03-06 DIAGNOSIS — D696 Thrombocytopenia, unspecified: Secondary | ICD-10-CM | POA: Diagnosis not present

## 2024-03-08 ENCOUNTER — Other Ambulatory Visit: Payer: Self-pay

## 2024-03-08 ENCOUNTER — Observation Stay (HOSPITAL_COMMUNITY)
Admission: EM | Admit: 2024-03-08 | Discharge: 2024-03-11 | Disposition: A | Discharge status: Home / Self Care | Attending: Family Medicine | Admitting: Family Medicine

## 2024-03-08 ENCOUNTER — Emergency Department (HOSPITAL_COMMUNITY)

## 2024-03-08 ENCOUNTER — Encounter (HOSPITAL_COMMUNITY): Payer: Self-pay

## 2024-03-08 DIAGNOSIS — R262 Difficulty in walking, not elsewhere classified: Secondary | ICD-10-CM | POA: Insufficient documentation

## 2024-03-08 DIAGNOSIS — M47812 Spondylosis without myelopathy or radiculopathy, cervical region: Secondary | ICD-10-CM | POA: Diagnosis not present

## 2024-03-08 DIAGNOSIS — Z79899 Other long term (current) drug therapy: Secondary | ICD-10-CM | POA: Diagnosis not present

## 2024-03-08 DIAGNOSIS — S060X0A Concussion without loss of consciousness, initial encounter: Secondary | ICD-10-CM

## 2024-03-08 DIAGNOSIS — S4992XA Unspecified injury of left shoulder and upper arm, initial encounter: Secondary | ICD-10-CM | POA: Diagnosis not present

## 2024-03-08 DIAGNOSIS — M50322 Other cervical disc degeneration at C5-C6 level: Secondary | ICD-10-CM | POA: Diagnosis not present

## 2024-03-08 DIAGNOSIS — S199XXA Unspecified injury of neck, initial encounter: Secondary | ICD-10-CM | POA: Diagnosis not present

## 2024-03-08 DIAGNOSIS — R2689 Other abnormalities of gait and mobility: Secondary | ICD-10-CM | POA: Diagnosis not present

## 2024-03-08 DIAGNOSIS — I10 Essential (primary) hypertension: Secondary | ICD-10-CM | POA: Diagnosis not present

## 2024-03-08 DIAGNOSIS — E785 Hyperlipidemia, unspecified: Secondary | ICD-10-CM | POA: Diagnosis not present

## 2024-03-08 DIAGNOSIS — Z9181 History of falling: Secondary | ICD-10-CM | POA: Insufficient documentation

## 2024-03-08 DIAGNOSIS — I6782 Cerebral ischemia: Secondary | ICD-10-CM | POA: Diagnosis not present

## 2024-03-08 DIAGNOSIS — M810 Age-related osteoporosis without current pathological fracture: Secondary | ICD-10-CM | POA: Insufficient documentation

## 2024-03-08 DIAGNOSIS — R2681 Unsteadiness on feet: Secondary | ICD-10-CM | POA: Insufficient documentation

## 2024-03-08 DIAGNOSIS — M6281 Muscle weakness (generalized): Secondary | ICD-10-CM | POA: Diagnosis not present

## 2024-03-08 DIAGNOSIS — M160 Bilateral primary osteoarthritis of hip: Secondary | ICD-10-CM | POA: Diagnosis not present

## 2024-03-08 DIAGNOSIS — S32038A Other fracture of third lumbar vertebra, initial encounter for closed fracture: Secondary | ICD-10-CM | POA: Diagnosis not present

## 2024-03-08 DIAGNOSIS — S32058A Other fracture of fifth lumbar vertebra, initial encounter for closed fracture: Secondary | ICD-10-CM | POA: Diagnosis not present

## 2024-03-08 DIAGNOSIS — M19012 Primary osteoarthritis, left shoulder: Secondary | ICD-10-CM | POA: Diagnosis not present

## 2024-03-08 DIAGNOSIS — S0990XA Unspecified injury of head, initial encounter: Secondary | ICD-10-CM | POA: Diagnosis not present

## 2024-03-08 DIAGNOSIS — S32028A Other fracture of second lumbar vertebra, initial encounter for closed fracture: Secondary | ICD-10-CM | POA: Insufficient documentation

## 2024-03-08 DIAGNOSIS — G9389 Other specified disorders of brain: Secondary | ICD-10-CM | POA: Diagnosis not present

## 2024-03-08 DIAGNOSIS — Z7982 Long term (current) use of aspirin: Secondary | ICD-10-CM | POA: Diagnosis not present

## 2024-03-08 DIAGNOSIS — S32018A Other fracture of first lumbar vertebra, initial encounter for closed fracture: Principal | ICD-10-CM | POA: Insufficient documentation

## 2024-03-08 DIAGNOSIS — E039 Hypothyroidism, unspecified: Secondary | ICD-10-CM | POA: Insufficient documentation

## 2024-03-08 DIAGNOSIS — E871 Hypo-osmolality and hyponatremia: Secondary | ICD-10-CM | POA: Insufficient documentation

## 2024-03-08 DIAGNOSIS — W19XXXA Unspecified fall, initial encounter: Secondary | ICD-10-CM | POA: Diagnosis not present

## 2024-03-08 DIAGNOSIS — S32009A Unspecified fracture of unspecified lumbar vertebra, initial encounter for closed fracture: Secondary | ICD-10-CM | POA: Insufficient documentation

## 2024-03-08 DIAGNOSIS — R531 Weakness: Secondary | ICD-10-CM | POA: Diagnosis not present

## 2024-03-08 DIAGNOSIS — M8580 Other specified disorders of bone density and structure, unspecified site: Secondary | ICD-10-CM | POA: Diagnosis not present

## 2024-03-08 DIAGNOSIS — E876 Hypokalemia: Secondary | ICD-10-CM

## 2024-03-08 DIAGNOSIS — Z043 Encounter for examination and observation following other accident: Secondary | ICD-10-CM | POA: Diagnosis not present

## 2024-03-08 LAB — CBC
HCT: 35.6 % — ABNORMAL LOW (ref 36.0–46.0)
Hemoglobin: 12.7 g/dL (ref 12.0–15.0)
MCH: 32 pg (ref 26.0–34.0)
MCHC: 35.7 g/dL (ref 30.0–36.0)
MCV: 89.7 fL (ref 80.0–100.0)
Platelets: 214 K/uL (ref 150–400)
RBC: 3.97 MIL/uL (ref 3.87–5.11)
RDW: 12.2 % (ref 11.5–15.5)
WBC: 6.9 K/uL (ref 4.0–10.5)
nRBC: 0 % (ref 0.0–0.2)

## 2024-03-08 LAB — COMPREHENSIVE METABOLIC PANEL WITH GFR
ALT: 26 U/L (ref 0–44)
AST: 27 U/L (ref 15–41)
Albumin: 4.2 g/dL (ref 3.5–5.0)
Alkaline Phosphatase: 71 U/L (ref 38–126)
Anion gap: 10 (ref 5–15)
BUN: 18 mg/dL (ref 8–23)
CO2: 26 mmol/L (ref 22–32)
Calcium: 9.2 mg/dL (ref 8.9–10.3)
Chloride: 92 mmol/L — ABNORMAL LOW (ref 98–111)
Creatinine, Ser: 0.51 mg/dL (ref 0.44–1.00)
GFR, Estimated: >60 mL/min (ref >60)
Glucose, Bld: 103 mg/dL — ABNORMAL HIGH (ref 70–99)
Potassium: 3 mmol/L — ABNORMAL LOW (ref 3.5–5.1)
Sodium: 128 mmol/L — ABNORMAL LOW (ref 135–145)
Total Bilirubin: 0.6 mg/dL (ref 0.0–1.2)
Total Protein: 6.4 g/dL — ABNORMAL LOW (ref 6.5–8.1)

## 2024-03-08 LAB — URINALYSIS, W/ REFLEX TO CULTURE (INFECTION SUSPECTED)
Bacteria, UA: NONE SEEN
Bilirubin Urine: NEGATIVE
Glucose, UA: NEGATIVE mg/dL
Hgb urine dipstick: NEGATIVE
Ketones, ur: NEGATIVE mg/dL
Nitrite: NEGATIVE
Protein, ur: NEGATIVE mg/dL
Specific Gravity, Urine: 1.004 — ABNORMAL LOW (ref 1.005–1.030)
pH: 7 (ref 5.0–8.0)

## 2024-03-08 MED ORDER — ACETAMINOPHEN 325 MG PO TABS
650.0000 mg | ORAL_TABLET | Freq: Once | ORAL | Status: AC
  Administered 2024-03-08: 650 mg via ORAL
  Filled 2024-03-08: qty 2

## 2024-03-08 MED ORDER — LIDOCAINE 5 % EX PTCH: 1.0000 | MEDICATED_PATCH | CUTANEOUS | 0 refills | Status: AC

## 2024-03-08 NOTE — Discharge Instructions (Addendum)
 You may take over-the-counter pain medication such as Tylenol  for baseline pain control.  We have prescribed you lidocaine  patches to use for additional pain relief.  Return for fevers, chills, severe headache, vision loss, facial droop, chest pain, shortness of breath, abdominal pain, inability to eat or drink due to nausea or vomiting or any new or worsening symptoms that are concerning to you.

## 2024-03-08 NOTE — ED Triage Notes (Addendum)
 Pt coming in with [facility] from [facility], she is coming in for a mechanical fall, was trying to get something out of her closet and fell against the wall and then fell on to her butt, she is not on blood thinners, no head injury, no cervical spine pain. She does express having tailbone pain and lower back pain on the right side.   Medic vitals   160/100 75hr 97% 98bgl

## 2024-03-08 NOTE — ED Provider Notes (Addendum)
 " [place] EMERGENCY DEPARTMENT AT [facility] Provider Note   CSN: [id number] Arrival date & time: 03/08/24  2020     Patient presents with: Becky Montoya is a 89 y.o. female.   This is a 89 year old female presenting emergency department after a mechanical fall.  Fell backwards onto her butt.  Not on blood thinners.  No LOC.  No nausea or vomiting.  Unsure if she hit her head or not.  Complaining of pain to her left upper arm into her right hip.  Denies chest pain, shortness of breath, abdominal pain.  No back pain   Fall       Prior to Admission medications  Medication Sig Start Date End Date Taking? Authorizing Provider  aspirin  EC 81 MG tablet Take 81 mg by mouth every morning.   Yes [provider]  carvedilol  (COREG ) 12.5 MG tablet Take 12.5 mg by mouth 2 (two) times daily with a meal.   Yes [provider]  Coenzyme Q10 (COQ10 PO) Take 1 Dose by mouth every evening.   Yes [provider]  FIBER PO Take 1 Dose by mouth daily.   Yes [provider]  levothyroxine  (SYNTHROID , LEVOTHROID) 50 MCG tablet Take 50 mcg by mouth every morning.   Yes [provider]  lidocaine  (LIDODERM ) 5 % Place 1 patch onto the skin daily. Remove & Discard patch within 12 hours or as directed by MD 03/08/24  Yes Neysa Caron PARAS, DO  mirtazapine  (REMERON ) 15 MG tablet Take 15 mg by mouth at bedtime. 12/25/23  Yes [provider]  Misc Natural Products (LUTEIN 20) CAPS Take 1 capsule by mouth 2 (two) times daily.   Yes [provider]  Multiple Vitamin (MULTIVITAMIN PO) Take 1 Dose by mouth in the morning and at bedtime.   Yes [provider]  Omega-3 Fatty Acids (OMEGA 3 PO) Take 1 capsule by mouth at bedtime.   Yes [provider]  Probiotic Product (PROBIOTIC PO) Take 1 Dose by mouth daily.   Yes [provider]  senna-docusate (SENOKOT-S) 8.6-50 MG tablet Take 2 tablets by mouth at  bedtime as needed for mild constipation or moderate constipation. 10/21/18  Yes Amin, Ankit C, MD  simvastatin  (ZOCOR ) 20 MG tablet Take 20 mg by mouth every evening.    Yes [provider]  valsartan (DIOVAN) 320 MG tablet Take 320 mg by mouth every evening. 10/23/23  Yes [provider]  acetaminophen  (TYLENOL ) 500 MG tablet Take 1 tablet (500 mg total) by mouth every 6 (six) hours as needed for mild pain (pain score 1-3), fever or headache. 03/11/24   Jadine Toribio SQUIBB, MD  LORazepam  (ATIVAN ) 0.5 MG tablet Take 0.5-1 tablets (0.25-0.5 mg total) by mouth daily as needed for anxiety or sleep. 03/11/24   Jadine Toribio SQUIBB, MD  oxyCODONE  (OXY IR/ROXICODONE ) 5 MG immediate release tablet Take 1 tablet (5 mg total) by mouth every 8 (eight) hours as needed for moderate pain (pain score 4-6) or severe pain (pain score 7-10). 03/11/24   Jadine Toribio SQUIBB, MD  polyethylene glycol (MIRALAX  / GLYCOLAX ) 17 g packet Take 17 g by mouth daily as needed for mild constipation. 03/11/24   Jadine Toribio SQUIBB, MD    Allergies: Wellbutrin [bupropion]    Review of Systems  Updated Vital Signs BP (!) 164/82 (BP Location: Left Arm)   Pulse 78   Temp 98 F (36.7 C) (Oral)   Resp 17  Ht 5' 2 (1.575 m)   Wt 53.5 kg   SpO2 98%   BMI 21.58 kg/m   Physical Exam Vitals and nursing note reviewed.  Constitutional:      General: She is not in acute distress.    Appearance: She is not toxic-appearing.  HENT:     Head: Normocephalic.     Mouth/Throat:     Mouth: Mucous membranes are moist.     Pharynx: Oropharynx is clear.  Eyes:     Conjunctiva/sclera: Conjunctivae normal.  Neck:     Comments: C-collar cleared Cardiovascular:     Rate and Rhythm: Normal rate and regular rhythm.  Pulmonary:     Effort: Pulmonary effort is normal.  Abdominal:     General: Abdomen is flat. There is no distension.     Tenderness: There is no abdominal tenderness. There is no guarding or rebound.   Musculoskeletal:        General: Normal range of motion.     Comments: No midline spinal tenderness.  Chest stable nontender.  Pelvis stable nontender.  Does have some bony tenderness to her left humerus.  5 out of 5 bicep strength tricep strength, plantarflexion dorsiflexion.  Normal sensation all extremities.  Equal pulses.  Skin:    General: Skin is warm and dry.     Capillary Refill: Capillary refill takes more than 3 seconds.  Neurological:     Mental Status: She is alert and oriented to person, place, and time.  Psychiatric:        Mood and Affect: Mood normal.        Behavior: Behavior normal.     (all labs ordered are listed, but only abnormal results are displayed) Labs Reviewed  CBC - Abnormal; Notable for the following components:      Result Value   HCT 35.6 (*)    All other components within normal limits  COMPREHENSIVE METABOLIC PANEL WITH GFR - Abnormal; Notable for the following components:   Sodium 128 (*)    Potassium 3.0 (*)    Chloride 92 (*)    Glucose, Bld 103 (*)    Total Protein 6.4 (*)    All other components within normal limits  URINALYSIS, W/ REFLEX TO CULTURE (INFECTION SUSPECTED) - Abnormal; Notable for the following components:   Color, Urine COLORLESS (*)    Specific Gravity, Urine 1.004 (*)    Leukocytes,Ua TRACE (*)    All other components within normal limits  BASIC METABOLIC PANEL WITH GFR - Abnormal; Notable for the following components:   Sodium 133 (*)    Chloride 96 (*)    Glucose, Bld 130 (*)    All other components within normal limits  PHOSPHORUS - Abnormal; Notable for the following components:   Phosphorus 2.3 (*)    All other components within normal limits  BASIC METABOLIC PANEL WITH GFR - Abnormal; Notable for the following components:   Sodium 132 (*)    Chloride 97 (*)    Glucose, Bld 104 (*)    Calcium  8.7 (*)    All other components within normal limits  CBC  MAGNESIUM   BASIC METABOLIC PANEL WITH GFR  MAGNESIUM    PHOSPHORUS    EKG: None  Radiology: No results found.    Procedures   Medications Ordered in the ED  acetaminophen  (TYLENOL ) tablet 650 mg (650 mg Oral Given 03/08/24 2309)  iohexol  (OMNIPAQUE ) 300 MG/ML solution 100 mL (100 mLs Intravenous Contrast Given 03/09/24 0222)  lactated ringers  bolus 500  mL (500 mLs Intravenous New Bag/Given 03/09/24 0311)    Clinical Course as of 04/09/24 0738  Sun Mar 08, 2024  2202 CT Head Wo Contrast IMPRESSION: 1. No acute intracranial abnormality. 2. Chronic right parietal encephalomalacia, chronic microvascular ischemia, and generalized atrophy. 3. Mucosal thickening in the ethmoid air cells and complete opacification of the right maxillary sinus. Correlate with sinusitis.  Electronically signed by: Norman Gatlin MD 03/08/2024 09:45 PM EST RP Workstation: [id number]   [TY]  2202 CT Cervical Spine Wo Contrast IMPRESSION: 1. No acute abnormality of the cervical spine.   [TY]  2305 Patient attempted to ambulate was able to get up and walk, although not at her baseline.  Does not feel that she can necessarily care for herself in her current condition.  Will expand workup, get basic labs, UA will also get CT chest abdomen pelvis. [TY]  2334 Care of patient from last team.  Fall at independent living facility.  Initial negative trauma workup.  Does not feel safe at current living facility due to this fall.  Family member requesting escalation of care.  CT chest abdomen pelvis trauma protocol pending.  If negative planning for transition of care consult. [CC]  2335 [phone] Kate Lee; Daughter. Requesting update when imaging returns.  [TY]  Mon Mar 09, 2024  0256 Consulted neurosurgery.  They stated it was okay for patient to participate in physical therapy as tolerated.  Bracing for comfort if patient wishes. [CC]    Clinical Course User Index [CC] Jerral Meth, MD [TY] Neysa Caron PARAS, DO                                  Medical Decision Making Well-appearing 89 year old presented after mechanical fall.  Past medical history to include hypertension, hyperlipidemia, breast cancer.  She is alert and oriented x 3 on exam.  Hypertensive, but otherwise stable vital signs.  No overt deformities or injuries identified on exam.  Did have some tenderness to her left upper arm.  Initial CT head, C-spine negative for acute pathology.  Chest x-ray, pelvis x-ray and humerus x-ray negative.  Attempted to ambulate patient and she was able to ambulate, but with some difficulty secondary to feeling generalized weakness.  Does not feel safe going home.  Added on labs and expanded imaging.  Care signed out to overnight team.  Amount and/or Complexity of Data Reviewed Independent Historian:     Details: Daughter notes that baseline mentation, but not walking at baseline External Data Reviewed:     Details: Not on blood thinners Labs: ordered. Radiology: ordered and independent interpretation performed. Decision-making details documented in ED Course.  Risk OTC drugs. Prescription drug management. Decision regarding hospitalization. Diagnosis or treatment significantly limited by social determinants of health. Risk Details: Independent living facility.   CT added on given patients inability to ambulate; recent admission with lumbar transverse process fractures. Therefore, CT ordered to assess stability of fractures and/or new fractures that may be contributing to her pain and inability to ambulate.      Final diagnoses:  Fall, initial encounter  Concussion without loss of consciousness, initial encounter    ED Discharge Orders          Ordered    acetaminophen  (TYLENOL ) 500 MG tablet  Every 6 hours PRN        03/11/24 0948    LORazepam  (ATIVAN ) 0.5 MG tablet  Daily PRN  03/11/24 0948    polyethylene glycol (MIRALAX  / GLYCOLAX ) 17 g packet  Daily PRN        03/11/24 0948    oxyCODONE  (OXY IR/ROXICODONE ) 5 MG  immediate release tablet  Every 8 hours PRN        03/11/24 0948    lidocaine  (LIDODERM ) 5 %  Every 24 hours        03/08/24 2241               Neysa Caron PARAS, DO 03/08/24 2340    Neysa Caron PARAS, DO 04/09/24 [phone]  "

## 2024-03-08 NOTE — ED Provider Notes (Signed)
 Care of patient received from prior provider at 11:36 PM, please see their note for complete H/P and care plan.  Received handoff per ED course.  Clinical Course as of 03/09/24 9742  [place] Mar 08, 2024  2202 CT Head Wo Contrast IMPRESSION: 1. No acute intracranial abnormality. 2. Chronic right parietal encephalomalacia, chronic microvascular ischemia, and generalized atrophy. 3. Mucosal thickening in the ethmoid air cells and complete opacification of the right maxillary sinus. Correlate with sinusitis.  Electronically signed by: Norman Gatlin MD 03/08/2024 09:45 PM EST RP Workstation: [id number]   [TY]  2202 CT Cervical Spine Wo Contrast IMPRESSION: 1. No acute abnormality of the cervical spine.   [TY]  2305 Patient attempted to ambulate was able to get up and walk, although not at her baseline.  Does not feel that she can necessarily care for herself in her current condition.  Will expand workup, get basic labs, UA will also get CT chest abdomen pelvis. [TY]  2334 Care of patient from last team.  Fall at independent living facility.  Initial negative trauma workup.  Does not feel safe at current living facility due to this fall.  Family member requesting escalation of care.  CT chest abdomen pelvis trauma protocol pending.  If negative planning for transition of care consult. [CC]  2335 [phone] Kate Lee; Daughter. Requesting update when imaging returns.  [TY]  Mon Mar 09, 2024  0256 Consulted neurosurgery.  They stated it was okay for patient to participate in physical therapy as tolerated.  Bracing for comfort if patient wishes. [CC]    Clinical Course User Index [CC] Jerral Meth, MD [TY] Neysa Caron PARAS, DO    Reassessment: CT is resulted with TP fractures of L1-L3 and L5.  Likely the etiology of the change in patient's mobility.  Consultation to neurosurgery and admission to medicine for physical therapy evaluation, pain management, safe disposition  determination.     Jerral Meth, MD 03/09/24 [phone]

## 2024-03-08 NOTE — ED Notes (Signed)
 Pt was able to ambulate with a walker to the bathroom with minimal to no assistance

## 2024-03-09 ENCOUNTER — Emergency Department (HOSPITAL_COMMUNITY)

## 2024-03-09 ENCOUNTER — Observation Stay (HOSPITAL_COMMUNITY)

## 2024-03-09 DIAGNOSIS — W19XXXA Unspecified fall, initial encounter: Secondary | ICD-10-CM | POA: Diagnosis present

## 2024-03-09 DIAGNOSIS — S32009A Unspecified fracture of unspecified lumbar vertebra, initial encounter for closed fracture: Secondary | ICD-10-CM | POA: Diagnosis not present

## 2024-03-09 DIAGNOSIS — S3993XA Unspecified injury of pelvis, initial encounter: Secondary | ICD-10-CM | POA: Diagnosis not present

## 2024-03-09 DIAGNOSIS — S3991XA Unspecified injury of abdomen, initial encounter: Secondary | ICD-10-CM | POA: Diagnosis not present

## 2024-03-09 DIAGNOSIS — S299XXA Unspecified injury of thorax, initial encounter: Secondary | ICD-10-CM | POA: Diagnosis not present

## 2024-03-09 DIAGNOSIS — G9389 Other specified disorders of brain: Secondary | ICD-10-CM | POA: Diagnosis not present

## 2024-03-09 DIAGNOSIS — R4182 Altered mental status, unspecified: Secondary | ICD-10-CM | POA: Diagnosis not present

## 2024-03-09 LAB — CBC
HCT: 39.3 % (ref 36.0–46.0)
Hemoglobin: 13.7 g/dL (ref 12.0–15.0)
MCH: 31.6 pg (ref 26.0–34.0)
MCHC: 34.9 g/dL (ref 30.0–36.0)
MCV: 90.8 fL (ref 80.0–100.0)
Platelets: 228 K/uL (ref 150–400)
RBC: 4.33 MIL/uL (ref 3.87–5.11)
RDW: 12.2 % (ref 11.5–15.5)
WBC: 7.5 K/uL (ref 4.0–10.5)
nRBC: 0 % (ref 0.0–0.2)

## 2024-03-09 LAB — PHOSPHORUS: Phosphorus: 2.3 mg/dL — ABNORMAL LOW (ref 2.5–4.6)

## 2024-03-09 LAB — BASIC METABOLIC PANEL WITH GFR
Anion gap: 9 (ref 5–15)
BUN: 13 mg/dL (ref 8–23)
CO2: 28 mmol/L (ref 22–32)
Calcium: 9.3 mg/dL (ref 8.9–10.3)
Chloride: 96 mmol/L — ABNORMAL LOW (ref 98–111)
Creatinine, Ser: 0.56 mg/dL (ref 0.44–1.00)
GFR, Estimated: >60 mL/min (ref >60)
Glucose, Bld: 130 mg/dL — ABNORMAL HIGH (ref 70–99)
Potassium: 3.9 mmol/L (ref 3.5–5.1)
Sodium: 133 mmol/L — ABNORMAL LOW (ref 135–145)

## 2024-03-09 LAB — MAGNESIUM: Magnesium: 2.1 mg/dL (ref 1.7–2.4)

## 2024-03-09 MED ORDER — LEVOTHYROXINE SODIUM 50 MCG PO TABS
50.0000 ug | ORAL_TABLET | Freq: Every day | ORAL | Status: DC
  Administered 2024-03-09 – 2024-03-11 (×3): 50 ug via ORAL
  Filled 2024-03-09 (×3): qty 1

## 2024-03-09 MED ORDER — LORAZEPAM 0.5 MG PO TABS
0.5000 mg | ORAL_TABLET | Freq: Every day | ORAL | Status: DC | PRN
  Administered 2024-03-09: 0.5 mg via ORAL
  Filled 2024-03-09 (×2): qty 1

## 2024-03-09 MED ORDER — MORPHINE SULFATE (PF) 2 MG/ML IV SOLN: 2.0000 mg | INTRAVENOUS | Status: DC | PRN

## 2024-03-09 MED ORDER — MIRTAZAPINE 15 MG PO TABS
15.0000 mg | ORAL_TABLET | Freq: Every day | ORAL | Status: DC
  Administered 2024-03-09 – 2024-03-10 (×2): 15 mg via ORAL
  Filled 2024-03-09 (×2): qty 1

## 2024-03-09 MED ORDER — HYDROCHLOROTHIAZIDE 12.5 MG PO TABS
12.5000 mg | ORAL_TABLET | Freq: Every morning | ORAL | Status: DC
  Administered 2024-03-09 – 2024-03-11 (×3): 12.5 mg via ORAL
  Filled 2024-03-09 (×3): qty 1

## 2024-03-09 MED ORDER — IRBESARTAN 75 MG PO TABS
37.5000 mg | ORAL_TABLET | Freq: Every day | ORAL | Status: DC
  Administered 2024-03-09: 37.5 mg via ORAL
  Filled 2024-03-09 (×2): qty 0.5

## 2024-03-09 MED ORDER — POLYETHYLENE GLYCOL 3350 17 G PO PACK: 17.0000 g | PACK | Freq: Every day | ORAL | Status: DC | PRN

## 2024-03-09 MED ORDER — MELATONIN 5 MG PO TABS: 5.0000 mg | ORAL_TABLET | Freq: Every evening | ORAL | Status: DC | PRN

## 2024-03-09 MED ORDER — POTASSIUM CHLORIDE 20 MEQ PO PACK
40.0000 meq | PACK | Freq: Two times a day (BID) | ORAL | Status: DC
  Administered 2024-03-09: 40 meq via ORAL
  Filled 2024-03-09: qty 2

## 2024-03-09 MED ORDER — IRBESARTAN 300 MG PO TABS
300.0000 mg | ORAL_TABLET | Freq: Every day | ORAL | Status: DC
  Administered 2024-03-10 – 2024-03-11 (×2): 300 mg via ORAL
  Filled 2024-03-09 (×2): qty 1

## 2024-03-09 MED ORDER — IOHEXOL 300 MG/ML  SOLN
100.0000 mL | Freq: Once | INTRAMUSCULAR | Status: AC | PRN
  Administered 2024-03-09: 100 mL via INTRAVENOUS

## 2024-03-09 MED ORDER — POTASSIUM CHLORIDE IN NACL 20-0.9 MEQ/L-% IV SOLN
INTRAVENOUS | Status: DC
  Filled 2024-03-09: qty 1000

## 2024-03-09 MED ORDER — CARVEDILOL 12.5 MG PO TABS
12.5000 mg | ORAL_TABLET | Freq: Two times a day (BID) | ORAL | Status: DC
  Administered 2024-03-09 – 2024-03-11 (×4): 12.5 mg via ORAL
  Filled 2024-03-09 (×4): qty 1

## 2024-03-09 MED ORDER — SIMVASTATIN 20 MG PO TABS
20.0000 mg | ORAL_TABLET | Freq: Every evening | ORAL | Status: DC
  Administered 2024-03-09 – 2024-03-10 (×2): 20 mg via ORAL
  Filled 2024-03-09 (×2): qty 1

## 2024-03-09 MED ORDER — ENSURE PLUS HIGH PROTEIN PO LIQD
237.0000 mL | Freq: Three times a day (TID) | ORAL | Status: DC
  Administered 2024-03-09 – 2024-03-11 (×7): 237 mL via ORAL

## 2024-03-09 MED ORDER — ACETAMINOPHEN 500 MG PO TABS: 500.0000 mg | ORAL_TABLET | Freq: Four times a day (QID) | ORAL | Status: DC | PRN

## 2024-03-09 MED ORDER — CARVEDILOL 3.125 MG PO TABS
6.2500 mg | ORAL_TABLET | Freq: Two times a day (BID) | ORAL | Status: DC
  Administered 2024-03-09: 6.25 mg via ORAL
  Filled 2024-03-09: qty 2

## 2024-03-09 MED ORDER — PROCHLORPERAZINE EDISYLATE 10 MG/2ML IJ SOLN: 5.0000 mg | Freq: Four times a day (QID) | INTRAMUSCULAR | Status: DC | PRN

## 2024-03-09 MED ORDER — LACTATED RINGERS IV BOLUS
500.0000 mL | Freq: Once | INTRAVENOUS | Status: AC
  Administered 2024-03-09: 500 mL via INTRAVENOUS

## 2024-03-09 MED ORDER — OXYCODONE HCL 5 MG PO TABS
5.0000 mg | ORAL_TABLET | Freq: Four times a day (QID) | ORAL | Status: DC | PRN
  Administered 2024-03-09: 5 mg via ORAL
  Filled 2024-03-09: qty 1

## 2024-03-09 MED ORDER — OYSTER SHELL CALCIUM/D3 500-5 MG-MCG PO TABS
1.0000 | ORAL_TABLET | Freq: Three times a day (TID) | ORAL | Status: DC
  Administered 2024-03-09 – 2024-03-11 (×7): 1 via ORAL
  Filled 2024-03-09 (×7): qty 1

## 2024-03-09 MED ORDER — POTASSIUM CHLORIDE CRYS ER 20 MEQ PO TBCR: 40.0000 meq | EXTENDED_RELEASE_TABLET | Freq: Once | ORAL | Status: DC

## 2024-03-09 NOTE — Evaluation (Signed)
 Physical Therapy Evaluation Patient Details Name: Becky Montoya MRN: [id number] DOB: May 30, 1926 Today's Date: 03/09/2024  History of Present Illness  Pt is a 89 y.o. female presenting to [facility] ED on 03/08/2024 after mechanical fall. Found to have nondisplaced fractures of the left transverse process of L1, L2, L3 and L5. PMH significant for arthritis, breast cancer, HLD, HTN, osteoporosis  Clinical Impression    Pt admitted with above diagnosis.  Pt currently with functional limitations due to the deficits listed below (see PT Problem List). Pt in bed when PT arrived. Pt agreeable to therapy intervention. Pt indicated L UE pain > LBP. Pt noted to have improved IND with mobility tasks vs earlier OT eval and session with MS. Pt required min A, increased time, cues for log roll technique for supine to sit, min A and cues for sit to stand  from EOB to RW, gait tasks 10 feet with RW, recliner close behind and min A, pt unable to modify posture for full trunk extension due to pain. Daughter arrived at end of therapy Eval and inquired per neurological consult, PT aware of consult pertaining to lumbar fx and recommendation for back precautions for comfort and conservative measures however daughter stated suspect CVA with elevated Bp noted earlier in year, sudden onset of inability to ambulate and urinary incontinence. PT made nursing aware of daughter's concerns. Pt left seated in recliner, all needs in place and daughter present. Patient will benefit from continued inpatient follow up therapy, <3 hours/day.  Pt will benefit from acute skilled PT to increase their independence and safety with mobility to allow discharge.         If plan is discharge home, recommend the following: A little help with walking and/or transfers;A lot of help with bathing/dressing/bathroom;Assistance with cooking/housework;Assist for transportation   Can travel by private vehicle   Yes    Equipment Recommendations None  recommended by PT (TBD in next venue)  Recommendations for Other Services       Functional Status Assessment Patient has had a recent decline in their functional status and demonstrates the ability to make significant improvements in function in a reasonable and predictable amount of time.     Precautions / Restrictions Precautions Precautions: Fall Recall of Precautions/Restrictions: Intact Precaution/Restrictions Comments: spinal precautions for comfort Restrictions Weight Bearing Restrictions Per Provider Order: No      Mobility  Bed Mobility Overal bed mobility: Needs Assistance Bed Mobility: Sidelying to Sit   Sidelying to sit: Min assist, Used rails       General bed mobility comments: cues for log roll and min A to complete with A for trunk from side lying to sit    Transfers Overall transfer level: Needs assistance Equipment used: Rolling walker (2 wheels) Transfers: Sit to/from Stand Sit to Stand: From elevated surface, Min assist           General transfer comment: min A, increased time, cues and pt able to push to stand. pt unable to tolerate full extension posture once in standing    Ambulation/Gait Ambulation/Gait assistance: Min assist Gait Distance (Feet): 10 Feet Assistive device: Rolling walker (2 wheels) Gait Pattern/deviations: Step-to pattern, Trunk flexed Gait velocity: decreased     General Gait Details: trunk flexion due to pain associated with trunk extension-- pt is unable to rate pain at rest or with mobility, pt amb with short stride length, limited foot clearance and slight B toe out  Stairs  Wheelchair Mobility     Tilt Bed    Modified Rankin (Stroke Patients Only)       Balance Overall balance assessment: History of Falls, Needs assistance Sitting-balance support: Feet supported, Bilateral upper extremity supported Sitting balance-Leahy Scale: Fair Sitting balance - Comments: no overt LOB, B UE support and  reports of L UE pain   Standing balance support: Reliant on assistive device for balance, Bilateral upper extremity supported, During functional activity Standing balance-Leahy Scale: Poor Standing balance comment: B UE support at RW and min A                             Pertinent Vitals/Pain Pain Assessment Pain Assessment: Faces Faces Pain Scale: Hurts a little bit Pain Location: LUE (LBP) Pain Descriptors / Indicators: Aching, Discomfort, Grimacing Pain Intervention(s): Limited activity within patient's tolerance, Monitored during session, Repositioned    Home Living Family/patient expects to be discharged to:: Private residence (ILF) Living Arrangements: Alone Available Help at Discharge: Available PRN/intermittently;Family Type of Home: Independent living facility Home Access: Level entry       Home Layout: One level Home Equipment: Rolling Walker (2 wheels);Grab bars - toilet;Grab bars - tub/shower;Shower seat;Hand held shower head;Wheelchair - manual (3 wheeled walker)      Prior Function Prior Level of Function : Independent/Modified Independent;History of Falls (last six months)             Mobility Comments: uses 3 wheeled walker; walking to dining hall at ILF prior to fall ADLs Comments: independent with dressing, has niece assist with bathing, can cook simple meals in apartment. family assists with transportation.     Extremity/Trunk Assessment   Upper Extremity Assessment Upper Extremity Assessment: Defer to OT evaluation    Lower Extremity Assessment Lower Extremity Assessment: Generalized weakness    Cervical / Trunk Assessment Cervical / Trunk Assessment: Other exceptions Cervical / Trunk Exceptions: nondisplaced fractures of the left transverse process of L1, L2, L3 and L5. Edu on spinal precautions for comfort  Communication   Communication Communication: No apparent difficulties    Cognition Arousal: Alert Behavior During  Therapy: WFL for tasks assessed/performed   PT - Cognitive impairments: No apparent impairments                         Following commands: Intact       Cueing Cueing Techniques: Verbal cues     General Comments      Exercises     Assessment/Plan    PT Assessment Patient needs continued PT services  PT Problem List Decreased activity tolerance;Decreased balance;Decreased mobility;Pain       PT Treatment Interventions DME instruction;Gait training;Stair training;Therapeutic activities;Therapeutic exercise;Balance training;Neuromuscular re-education;Patient/family education    PT Goals (Current goals can be found in the Care Plan section)  Acute Rehab PT Goals Patient Stated Goal: to be able to move better and return to ILF PT Goal Formulation: With patient Time For Goal Achievement: 03/23/24 Potential to Achieve Goals: Good    Frequency Min 2X/week     Co-evaluation               AM-PAC PT 6 Clicks Mobility  Outcome Measure Help needed turning from your back to your side while in a flat bed without using bedrails?: A Little Help needed moving from lying on your back to sitting on the side of a flat bed without using bedrails?: A Little Help needed  moving to and from a bed to a chair (including a wheelchair)?: A Little Help needed standing up from a chair using your arms (e.g., wheelchair or bedside chair)?: A Little Help needed to walk in hospital room?: A Little Help needed climbing 3-5 steps with a railing? : Total 6 Click Score: 16    End of Session Equipment Utilized During Treatment: Gait belt Activity Tolerance: Patient limited by pain;Patient limited by fatigue Patient left: in chair;with call bell/phone within reach;with family/visitor present Nurse Communication: Mobility status PT Visit Diagnosis: Unsteadiness on feet (R26.81);Other abnormalities of gait and mobility (R26.89);Muscle weakness (generalized) (M62.81);History of falling  (Z91.81);Difficulty in walking, not elsewhere classified (R26.2);Pain Pain - Right/Left: Left Pain - part of body: Shoulder;Arm (LBP)    Time: 8660-8647 PT Time Calculation (min) (ACUTE ONLY): 13 min   Charges:   PT Evaluation $PT Eval Low Complexity: 1 Low   PT General Charges $$ ACUTE PT VISIT: 1 Visit         [place], PT Acute Rehab   [place] VEAR Drone 03/09/2024, 2:08 PM

## 2024-03-09 NOTE — Care Management CC44 (Signed)
 Condition Code 44 Documentation Completed  Patient Details  Name: Becky Montoya MRN: [id number] Date of Birth: 22-Sep-1926   Condition Code 44 given:  Yes Patient signature on Condition Code 44 notice:  Yes Documentation of 2 MD's agreement:  Yes Code 44 added to claim:  Yes    Darryle Dennie M Keevon Henney, LCSW 03/09/2024, 4:40 PM

## 2024-03-09 NOTE — Progress Notes (Signed)
  PROGRESS NOTE  Patient admitted earlier this morning. See H&P.   Becky Montoya is a 89 y.o. female with medical history significant for hypertension on HCTZ, hyperlipidemia, osteoporosis, hypothyroidism, who presents to the ER from independent living facility after a mechanical fall.  The patient was trying to get an object from her desk, lost her balance, and fell against a wall landing on her left side.   CT chest abdomen and pelvis with contrast revealed nondisplaced fractures of the left transverse process of L1, L2, L3 and L5.  Otherwise no acute abnormality in the chest, abdomen or pelvis. EDP discussed the case with neurosurgery, recommended physical therapy as tolerated.  Bracing for comfort if patient wishes.   Patient states that she worked with physical therapy this morning.  Denies severe pain in her back.  Has some left upper extremity pain especially when she pushes off chair.  Feels unsteady and unsafe to go back to her home.  She lives in an independent living facility.  PT OT TOC consulted for SNF placement Supportive care for pain Home medications for blood pressure including Coreg, valsartan, HCTZ Sodium, potassium improved  Status is: Inpatient Remains inpatient appropriate because: PT OT   Delon Hoe, DO [facility] 03/09/2024, 10:08 AM  Available via Epic secure chat 7am-7pm After these hours, please refer to coverage provider listed on [email or web address]

## 2024-03-09 NOTE — TOC Initial Note (Signed)
 Transition of Care [facility]) - Initial/Assessment Note    Patient Details  Name: Becky Montoya MRN: [id number] Date of Birth: November 08, 1926  Transition of Care [facility]) CM/SW Contact:    Tawni CHRISTELLA Eva, LCSW Phone Number: 03/09/2024, 4:49 PM  Clinical Narrative:                  CSW met with the pt at bedside. The pt is from [facility]. CSW discussed the recommendation for SNF placement. The pt is agreeable and would like to return to [facility]. CSW explained the process, noting that the pt will not need insurance authorization. CSW will fax the pt's information to the facility and ICM to follow up tomorrow.   Expected Discharge Plan: Skilled Nursing Facility Barriers to Discharge: Continued Medical Work up, SNF Pending bed offer   Patient Goals and CMS Choice   CMS Medicare.gov Compare Post Acute Care list provided to:: Patient Choice offered to / list presented to : Patient      Expected Discharge Plan and Services       Living arrangements for the past 2 months: Independent Living Facility                                      Prior Living Arrangements/Services Living arrangements for the past 2 months: Independent Living Facility Lives with:: Self Patient language and need for interpreter reviewed:: Yes        Need for Family Participation in Patient Care: No (Comment) Care giver support system in place?: No (comment)   Criminal Activity/Legal Involvement Pertinent to Current Situation/Hospitalization: No - Comment as needed  Activities of Daily Living   ADL Screening (condition at time of admission) Independently performs ADLs?: Yes (appropriate for developmental age) Is the patient deaf or have difficulty hearing?: No Does the patient have difficulty seeing, even when wearing glasses/contacts?: No Does the patient have difficulty concentrating, remembering, or making decisions?: No  Permission Sought/Granted                   Emotional Assessment Appearance:: Appears stated age Attitude/Demeanor/Rapport: Gracious Affect (typically observed): Accepting Orientation: : Oriented to Self, Oriented to Place, Oriented to  Time, Oriented to Situation   Psych Involvement: No (comment)  Admission diagnosis:  Fall [W19.XXXA] Concussion without loss of consciousness, initial encounter [S06.0X0A] Fall, initial encounter [W19.XXXA] Patient Active Problem List   Diagnosis Date Noted   Fall 03/09/2024   Unilateral primary osteoarthritis, right hip 05/31/2023   Stress reaction of shaft of femur, right, initial encounter 11/20/2019   Age-related osteoporosis without current pathological fracture 07/31/2019   Fall at home, initial encounter 10/19/2018   Closed left subtrochanteric femur fracture, initial encounter (HCC) 10/18/2018   Closed left subtrochanteric femur fracture (HCC) 10/18/2018   Ductal carcinoma in situ (DCIS) of left breast 12/27/2016   Mastitis, left, acute 12/19/2012   Malignant neoplasm of upper-outer quadrant of left breast in female, estrogen receptor positive (HCC) 09/15/2012   Chest pain 05/28/2012   Hypothyroidism 05/28/2012   Hypertension 05/28/2012   Hyperlipidemia 05/28/2012   PCP:  Teresa Channel, MD Pharmacy:   [phone] [address] [place], [place] - [address] [place] [address] Phone: [phone] Fax: [phone]  [id number] [place], [place] - [address] AT [facility] [address] [place] [address] Phone: [phone]  Fax: [phone]  [id number] - [place], [place] - [address] [place] [address] Phone: [phone] Fax: [phone]  [facility] - [place], [place] - [address] [place] [address] [place] [address] Phone: [phone] Fax: [phone]     Social Drivers  of Health (SDOH) Social History: SDOH Screenings   Food Insecurity: No Food Insecurity (03/09/2024)  Housing: Low Risk  (03/09/2024)  Transportation Needs: No Transportation Needs (03/09/2024)  Utilities: Not At Risk (03/09/2024)  Social Connections: Moderately Integrated (03/09/2024)  Tobacco Use: Low Risk  (03/08/2024)   SDOH Interventions:     Readmission Risk Interventions     No data to display

## 2024-03-09 NOTE — H&P (Signed)
 History and Physical  Becky Montoya FMW:[id number] DOB: January 09, 1927 DOA: 03/08/2024  Referring physician: Dr. Jerral, EDP  PCP: Teresa Channel, MD  Outpatient Specialists: Orthopedic surgery, ENT. Patient coming from: Independent living facility.  Chief Complaint: Fall  HPI: Becky Montoya is a 89 y.o. female with medical history significant for hypertension on HCTZ, hyperlipidemia, osteoporosis, hypothyroidism, who presents to the ER from independent living facility after a mechanical fall.  The patient was trying to get an object from her desk, lost her balance, and fell against a wall landing on her left side.  Prior to that she has had a poor appetite and has been feeling anxious about her son in law who is dealing with a new cancer diagnosis.  EMS was activated.  In the ER, hypertensive.  SBP in the 200s.  UA was negative for pyuria.  CT head/CT cervical spine without contrast revealed no acute intracranial and cervical spine abnormality.  Chronic right parietal encephalomalacia, chronic microvascular ischemia and generalized atrophy.  Mucosal thickening in the ethmoid air cells and complete opacification of the right maxillary sinus.  Correlate with sinusitis.  Chest x-ray was nonacute.  Lab studies were notable for electrolyte imbalance, serum sodium 128, potassium 3.0.  Right hip x-ray revealed no acute fracture or dislocation.  Mild degenerative changes of both hips.  Left-sided hip screw was appear uncomplicated.  Left humerus x-ray revealed no acute fracture or dislocation.  Mild degenerative changes of the acromioclavicular joint and glenohumeral joint.  CT chest abdomen and pelvis with contrast revealed nondisplaced fractures of the left transverse process of L1, L2, L3 and L5.  Otherwise no acute abnormality in the chest, abdomen or pelvis.  EDP discussed the case with neurosurgery, recommended physical therapy as tolerated.  Bracing for comfort if patient wishes.  Admitted  by [facility], hospitalist service, for pain control, IV fluid, electrolytes replacement, and PT OT.  ED Course: Temperature 98.2.  BP 189/75, pulse 75, respiration rate 17, O2 saturation 98% on room air.  Review of Systems: Review of systems as noted in the HPI. All other systems reviewed and are negative.   Past Medical History:  Diagnosis Date   Arthritis    Breast cancer (HCC)    Hyperlipidemia    Hypertension    Osteoporosis    Thyroid  disease    Past Surgical History:  Procedure Laterality Date   ABDOMINAL HYSTERECTOMY     APPENDECTOMY     BREAST LUMPECTOMY WITH NEEDLE LOCALIZATION Left 10/09/2012   Procedure: BREAST LUMPECTOMY WITH NEEDLE LOCALIZATION;  Surgeon: Deward GORMAN Curvin DOUGLAS, MD;  Location: [facility];  Service: General;  Laterality: Left;  needle localization at SOLIS 7:30    COLONOSCOPY     fissures     INTRAMEDULLARY (IM) NAIL INTERTROCHANTERIC Left 10/19/2018   Procedure: INTRAMEDULLARY (IM) NAIL INTERTROCHANTRIC;  Surgeon: Jerri Kay CHRISTELLA, MD;  Location: [facility] OR;  Service: Orthopedics;  Laterality: Left;   MOUTH SURGERY      Social History:  reports that she has never smoked. She has never used smokeless tobacco. She reports that she does not drink alcohol  and does not use drugs.   Allergies  Allergen Reactions   Wellbutrin [Bupropion] Other (See Comments)    Nervousness    Family History  Problem Relation Age of Onset   Breast cancer Sister        dx in her 55s   Cancer Maternal Grandmother        unknown form of cancer  Heart attack Maternal Grandfather    Cancer Cousin        maternal cousin with unknown form of cancer      Prior to Admission medications   Medication Sig Start Date End Date Taking? Authorizing Provider  lidocaine  (LIDODERM ) 5 % Place 1 patch onto the skin daily. Remove & Discard patch within 12 hours or as directed by MD 03/08/24  Yes Neysa Caron PARAS, DO  aspirin  EC 81 MG tablet Take 81 mg by mouth every morning.    [provider]  calcium -vitamin D  (OSCAL WITH D) 500-200 MG-UNIT TABS tablet TAKE 1 TABLET BY MOUTH THREE TIMES A DAY 09/28/19   Jerri Kay HERO, MD  denosumab  (PROLIA ) 60 MG/ML SOLN injection Inject 60 mg into the skin every 6 (six) months. Administer in upper arm, thigh, or abdomen    [provider]  levothyroxine  (SYNTHROID , LEVOTHROID) 50 MCG tablet Take 50 mcg by mouth every morning.    [provider]  losartan -hydrochlorothiazide  (HYZAAR) 100-12.5 MG per tablet Take 1 tablet by mouth every morning.    [provider]  Magnesium  250 MG TABS Take 250 mg by mouth daily.    [provider]  methylPREDNISolone  (MEDROL  DOSEPAK) 4 MG TBPK tablet Take as directed 11/08/23   Jule Ronal CROME, PA-C  Misc Natural Products (LUTEIN 20) CAPS Take 1 capsule by mouth every morning.    [provider]  Omega-3 Fatty Acids (OMEGA 3 PO) Take 1 capsule by mouth daily.    [provider]  polyethylene glycol (MIRALAX  / GLYCOLAX ) 17 g packet Take 17 g by mouth daily as needed for mild constipation. 10/21/18   Amin, Ankit C, MD  predniSONE  (STERAPRED UNI-PAK 21 TAB) 10 MG (21) TBPK tablet Take as directed 11/20/19   Jerri Kay HERO, MD  senna-docusate (SENOKOT-S) 8.6-50 MG tablet Take 2 tablets by mouth at bedtime as needed for mild constipation or moderate constipation. 10/21/18   Amin, Ankit C, MD  simvastatin  (ZOCOR ) 20 MG tablet Take 20 mg by mouth every evening.     [provider]  vitamin E  400 UNIT capsule Take 400 Units by mouth every evening.    [provider]    Physical Exam: BP (!) 169/61   Pulse 72   Temp 98.2 F (36.8 C) (Oral)   Resp 17   SpO2 97%   General: 89 y.o. year-old female well developed well nourished in no acute distress.  Alert and oriented x3. Cardiovascular: Regular rate and rhythm with no rubs or gallops.  No thyromegaly or JVD noted.  No lower extremity edema. 2/4 pulses in all 4 extremities. Respiratory: Clear  to auscultation with no wheezes or rales. Good inspiratory effort. Abdomen: Soft nontender nondistended with normal bowel sounds x4 quadrants. Muskuloskeletal: No cyanosis, clubbing or edema noted bilaterally Neuro: CN II-XII intact, strength, sensation, reflexes Skin: No ulcerative lesions noted or rashes Psychiatry: Judgement and insight appear normal. Mood is appropriate for condition and setting          Labs on Admission:  Basic Metabolic Panel: Recent Labs  Lab 03/08/24 2305  NA 128*  K 3.0*  CL 92*  CO2 26  GLUCOSE 103*  BUN 18  CREATININE 0.51  CALCIUM  9.2   Liver Function Tests: Recent Labs  Lab 03/08/24 2305  AST 27  ALT 26  ALKPHOS 71  BILITOT 0.6  PROT 6.4*  ALBUMIN 4.2   No results for input(s): LIPASE, AMYLASE in the last 168 hours.  No results for input(s): AMMONIA in the last 168 hours. CBC: Recent Labs  Lab 03/08/24 2305  WBC 6.9  HGB 12.7  HCT 35.6*  MCV 89.7  PLT 214   Cardiac Enzymes: No results for input(s): CKTOTAL, CKMB, CKMBINDEX, TROPONINI in the last 168 hours.  BNP (last 3 results) No results for input(s): BNP in the last 8760 hours.  ProBNP (last 3 results) No results for input(s): PROBNP in the last 8760 hours.  CBG: No results for input(s): GLUCAP in the last 168 hours.  Radiological Exams on Admission: CT CHEST ABDOMEN PELVIS W CONTRAST Result Date: 03/09/2024 EXAM: CT CHEST, ABDOMEN AND PELVIS WITH CONTRAST 03/09/2024 02:21:59 AM TECHNIQUE: CT of the chest, abdomen and pelvis was performed with the administration of 100 mL of intravenous iohexol (OMNIPAQUE) 300 MG/ML solution. Multiplanar reformatted images are provided for review. Automated exposure control, iterative reconstruction, and/or weight based adjustment of the mA/kV was utilized to reduce the radiation dose to as low as reasonably achievable. COMPARISON: Comparison with chest radiograph dated 07/07/2023, which was without evidence of acute  findings. CLINICAL HISTORY: Polytrauma, blunt. FINDINGS: CHEST: MEDIASTINUM AND LYMPH NODES: Coronary artery and aortic atherosclerotic calcification. Heart and pericardium are unremarkable. The central airways are clear. No mediastinal, hilar or axillary lymphadenopathy. LUNGS AND PLEURA: No focal consolidation or pulmonary edema. No pleural effusion or pneumothorax. ABDOMEN AND PELVIS: LIVER: The liver is unremarkable. GALLBLADDER AND BILE DUCTS: Gallbladder is unremarkable. No biliary ductal dilatation. SPLEEN: No acute abnormality. PANCREAS: No acute abnormality. ADRENAL GLANDS: No acute abnormality. KIDNEYS, URETERS AND BLADDER: No stones in the kidneys or ureters. No hydronephrosis. No perinephric or periureteral stranding. Urinary bladder is unremarkable. GI AND BOWEL: Stomach demonstrates no acute abnormality. Colonic diverticulosis without evidence of diverticulitis. There is no bowel obstruction. REPRODUCTIVE ORGANS: Hysterectomy. PERITONEUM AND RETROPERITONEUM: No ascites. No free air. VASCULATURE: Aorta is normal in caliber. ABDOMINAL AND PELVIS LYMPH NODES: No lymphadenopathy. BONES AND SOFT TISSUES: Intramedullary rod and screw fixation left femur. Acute nondisplaced fractures of the left transverse process of L1 and L5. Age indeterminate fractures of the left transverse processes of L2 and L3. No focal soft tissue abnormality. IMPRESSION: 1. Nondisplaced fractures of the left transverse process of L1, L2, L3, and L5. 2. Otherwise no acute abnormality in the chest, abdomen, or pelvis. Electronically signed by: Norman Gatlin MD 03/09/2024 02:41 AM EST RP Workstation: [id number]   DG Chest 1 View Result Date: 03/08/2024 CLINICAL DATA:  Fall EXAM: CHEST  1 VIEW COMPARISON:  Chest x-ray 12/05/2023 FINDINGS: The heart size and mediastinal contours are within normal limits. Both lungs are clear. Prominent calcification at the anterior right fifth rib end appears unchanged. No acute fractures are seen.  IMPRESSION: No active disease. Electronically Signed   By: Greig Pique M.D.   On: 03/08/2024 22:09   DG Hip Unilat W or Wo Pelvis 2-3 Views Right Result Date: 03/08/2024 CLINICAL DATA:  Fall EXAM: DG HIP (WITH OR WITHOUT PELVIS) 2-3V RIGHT COMPARISON:  Right hip x-ray 10/09/2022. MRI of the right femur 11/07/2019. FINDINGS: The bones are osteopenic. There is no acute fracture or dislocation. There are mild degenerative changes of both hips. Left-sided hip screws are present which appear uncomplicated. Focal area of cortical thickening with linear lucencies in the lateral aspect of the proximal femoral diaphysis appear unchanged from 2021 likely related to old injury. Peripheral vascular calcifications are present. Severe degenerative changes affect the spine. IMPRESSION: 1. No acute fracture or dislocation. 2. Mild degenerative changes of both hips.  3. Left-sided hip screws appear uncomplicated. Electronically Signed   By: Greig Pique M.D.   On: 03/08/2024 22:08   DG Humerus Left Result Date: 03/08/2024 CLINICAL DATA:  Trauma EXAM: LEFT HUMERUS - 2+ VIEW COMPARISON:  None Available. FINDINGS: There is no evidence of fracture or other focal bone lesions. Soft tissues are unremarkable. There are mild degenerative changes of the acromioclavicular joint glenohumeral joint. IMPRESSION: 1. No acute fracture or dislocation. 2. Mild degenerative changes of the acromioclavicular joint and glenohumeral joint. Electronically Signed   By: Greig Pique M.D.   On: 03/08/2024 22:04   CT Cervical Spine Wo Contrast Result Date: 03/08/2024 EXAM: CT CERVICAL SPINE WITHOUT CONTRAST 03/08/2024 09:14:49 PM TECHNIQUE: CT of the cervical spine was performed without the administration of intravenous contrast. Multiplanar reformatted images are provided for review. Automated exposure control, iterative reconstruction, and/or weight based adjustment of the mA/kV was utilized to reduce the radiation dose to as low as  reasonably achievable. COMPARISON: None available. CLINICAL HISTORY: Neck trauma (Age >= 65y) FINDINGS: CERVICAL SPINE: BONES AND ALIGNMENT: No acute fracture or traumatic malalignment. Anterolisthesis of L4 is presumed chronic. DEGENERATIVE CHANGES: Multilevel spondylosis, disc space height loss, and degenerative endplate changes greatest at C5-C6 and C6-C7 where it is advanced. No severe spinal canal narrowing. SOFT TISSUES: No prevertebral soft tissue swelling. IMPRESSION: 1. No acute abnormality of the cervical spine. Electronically signed by: Norman Gatlin MD 03/08/2024 09:47 PM EST RP Workstation: [id number]   CT Head Wo Contrast Result Date: 03/08/2024 EXAM: CT HEAD WITHOUT CONTRAST 03/08/2024 09:14:49 PM TECHNIQUE: CT of the head was performed without the administration of intravenous contrast. Automated exposure control, iterative reconstruction, and/or weight based adjustment of the mA/kV was utilized to reduce the radiation dose to as low as reasonably achievable. COMPARISON: None available. CLINICAL HISTORY: Head trauma, minor (Age >= 65y). FINDINGS: BRAIN AND VENTRICLES: Chronic right parietal encephalomalacia. Chronic microvascular ischemia and generalized atrophy. No acute hemorrhage. No evidence of acute infarct. No hydrocephalus. No extra-axial collection. No mass effect or midline shift. ORBITS: No acute abnormality. SINUSES: Mucosal thickening in the ethmoid air cells. Complete opacification of the right maxillary sinus. SOFT TISSUES AND SKULL: No acute soft tissue abnormality. No skull fracture. IMPRESSION: 1. No acute intracranial abnormality. 2. Chronic right parietal encephalomalacia, chronic microvascular ischemia, and generalized atrophy. 3. Mucosal thickening in the ethmoid air cells and complete opacification of the right maxillary sinus. Correlate with sinusitis. Electronically signed by: Norman Gatlin MD 03/08/2024 09:45 PM EST RP Workstation: [id number]    EKG: I independently  viewed the EKG done and my findings are as followed: None available at the time of this visit.  Assessment/Plan Present on Admission:  Fall  Principal Problem:   Fall  Mechanical fall with nondisplaced fractures of the left transverse process of L1, L2, L3 and L5, POA Pain control PT OT assessment Fall precautions TOC consulted for placement, possibly rehab.  Hypertension, BP still at goal, elevated. Likely contributed by pain Pain control Resume home Coreg and valsartan Hold off home HCTZ due to hyponatremia. Closely monitor vital signs.  Hypovolemic hyponatremia Serum sodium 128 Hypovolemic on exam Hold off home HCTZ NS IV fluid with KCl replacement Repeat BMP in the morning.  Hypokalemia secondary to poor oral intake Serum potassium 3.0 Repleted intravenously and orally. Check magnesium  level Repeat BMP in the morning.  Moderate protein calorie malnutrition Weight 59 kg Liberalize diet Encourage oral intake as tolerated.  Osteoporosis Prior to admission on Prolia , Os-Cal D Continue fall precautions.  Hypothyroidism Resume home levothyroxine   Hyperlipidemia Resume home simvastatin   Generalized weakness PT OT assessment Fall precautions.  Situational anxiety The patient is on Ativan prior to admission   Time: 75 minutes.   DVT prophylaxis: SCDs.  Code Status: Full code.  Family Communication: None at bedside.  Disposition Plan: Admitted to telemetry unit.  Consults called: EDP discussed the case with neurosurgery.  Admission status: Inpatient status.   Status is: Inpatient The patient requires at least 2 midnights for further evaluation and treatment of present condition.   Terry LOISE Hurst MD [facility] Pager [phone]  If 7PM-7AM, please contact night-coverage [email or web address] Password [email or web address]  03/09/2024, 3:05 AM

## 2024-03-09 NOTE — Plan of Care (Signed)
  Problem: Education: Goal: Knowledge of General Education information will improve Description: Including pain rating scale, medication(s)/side effects and non-pharmacologic comfort measures Outcome: Progressing   Problem: Clinical Measurements: Goal: Will remain free from infection Outcome: Progressing Goal: Respiratory complications will improve Outcome: Progressing Goal: Cardiovascular complication will be avoided Outcome: Progressing   Problem: Pain Managment: Goal: General experience of comfort will improve and/or be controlled Outcome: Progressing

## 2024-03-09 NOTE — Progress Notes (Signed)
 NSKCL 20 meq @75  cc/hr stopped with serum sodium 133 from 128 and K+ 3.9.  Patient is able to tolerate po intake.  No charge note.

## 2024-03-09 NOTE — Progress Notes (Signed)
 Mobility Specialist - Progress Note   03/09/24 1320  Mobility  Activity Pivoted/transferred to/from [facility]  Level of Assistance +2 (takes two people)  Assistive Device BSC  Range of Motion/Exercises Active  Activity Response Tolerated fair  Mobility visit 1 Mobility  Mobility Specialist Start Time (ACUTE ONLY) 1310  Mobility Specialist Stop Time (ACUTE ONLY) 1320  Mobility Specialist Time Calculation (min) (ACUTE ONLY) 10 min   NT requested assistance to transfer pt from [facility] to bed. Pt become fatigued with transfer. At EOS was left in bed with all needs met. Call bell in reach and daughter in room.   Erminio Leos,  Mobility Specialist Can be reached via Secure Chat

## 2024-03-09 NOTE — Evaluation (Signed)
 Occupational Therapy Evaluation Patient Details Name: Becky Montoya MRN: [id number] DOB: 1926-04-21 Today's Date: 03/09/2024   History of Present Illness   Pt is a 89 y.o. female presenting to [facility] ED after mechanical fall. Found to have nondisplaced fractures of the left transverse process of L1, L2, L3 and L5. PMH significant for arthritis, breast cancer, HLD, HTN, osteoporosis     Clinical Impressions Pt admitted with above diagnosis. Prior to admission, pt from ILF and was mod independent for dressing, requires assist from niece for showers and able to walk to dining hall using 3-wheeled walker. Pt denies LBP throughout session, only pain reported is in LUE with transfers and RW use. Edu provided to pt on spinal precautions including use of log roll technique for comfort. Pt able to perform bed mobility with MIN A, MOD - MAX A for STS transfers (bed elevated) and MAX A for step pivot to recliner. Anticipate will require MAX A for LB ADL mgmt and recommend +2 for mobility progression due to deficits in balance & weakness. Pt would benefit from skilled OT services to address noted impairments and functional limitations (see below for any additional details) in order to maximize safety and independence while minimizing falls risk and caregiver burden. Anticipate the need for follow up OT services upon acute hospital DC. Patient will benefit from continued inpatient follow up therapy, <3 hours/day.      If plan is discharge home, recommend the following:   Two people to help with walking and/or transfers;A lot of help with bathing/dressing/bathroom;Assist for transportation     Functional Status Assessment   Patient has had a recent decline in their functional status and demonstrates the ability to make significant improvements in function in a reasonable and predictable amount of time.     Equipment Recommendations   None recommended by OT      Precautions/Restrictions    Precautions Precautions: Fall Recall of Precautions/Restrictions: Intact Precaution/Restrictions Comments: spinal precautions for comfort Restrictions Weight Bearing Restrictions Per Provider Order: No     Mobility Bed Mobility Overal bed mobility: Needs Assistance Bed Mobility: Rolling, Sidelying to Sit Rolling: Min assist, Used rails Sidelying to sit: Min assist, Used rails       General bed mobility comments: edu on log roll technique, pt able to return demo with MIN A    Transfers Overall transfer level: Needs assistance Equipment used: Rolling walker (2 wheels) Transfers: Sit to/from Stand, Bed to chair/wheelchair/BSC Sit to Stand: Mod assist, From elevated surface     Step pivot transfers: Max assist, From elevated surface     General transfer comment: MAX A to rise from standard bed height, posterior lean with L foot blocked. second STS with bed elevated and MOD A. Requires cues for upright posture and has significant posterior bias. MAX A for transfer - requires manual facilitation of RW, assist at trunk and step by step cues for weight shift and R foot clearance.      Balance Overall balance assessment: History of Falls, Needs assistance Sitting-balance support: Feet supported, No upper extremity supported Sitting balance-Leahy Scale: Fair Sitting balance - Comments: R lateral lean once upright at EOB, self-corrects Postural control: Posterior lean, Right lateral lean Standing balance support: Reliant on assistive device for balance, Bilateral upper extremity supported, During functional activity Standing balance-Leahy Scale: Poor Standing balance comment: requires external support at BUE and trunk for postural correction and body mechanics  ADL either performed or assessed with clinical judgement   ADL Overall ADL's : Needs assistance/impaired Eating/Feeding: Independent;Bed level   Grooming: Sitting;Set up   Upper Body  Bathing: Sitting;Minimal assistance   Lower Body Bathing: Sit to/from stand;Cueing for back precautions;Maximal assistance Lower Body Bathing Details (indicate cue type and reason): anticipated Upper Body Dressing : Sitting;Minimal assistance   Lower Body Dressing: Maximal assistance;Sit to/from stand;Cueing for back precautions Lower Body Dressing Details (indicate cue type and reason): anticipated Toilet Transfer: Maximal assistance;Rolling walker (2 wheels);Cueing for safety;Cueing for sequencing Toilet Transfer Details (indicate cue type and reason): simulated bed > recliner. MAX A for upright posture, pt with poor foot clearance, unable to rely heavily on BUE due to pain in LUE; requires support at trunk 2/2 posterior lean. small, shuffled steps to recliner. would recommend use of BSC for transfers and +2 Toileting- Clothing Manipulation and Hygiene: Maximal assistance;Cueing for back precautions;Sit to/from stand       Functional mobility during ADLs: Cueing for safety;Rolling walker (2 wheels);Cueing for sequencing;Maximal assistance General ADL Comments: edu on spinal precautions for comfort throughout. pt does not report LBP or discomfort with any mobility. anticipate MAX A for LB ADLs and recommend +2 for mobility with close chair follow.     Vision Baseline Vision/History: 1 Wears glasses Ability to See in Adequate Light: 0 Adequate Patient Visual Report: No change from baseline              Pertinent Vitals/Pain Pain Assessment Pain Assessment: Faces Faces Pain Scale: Hurts a little bit Pain Location: LUE (did not report any LBP or discomfort) Pain Descriptors / Indicators: Aching, Discomfort, Grimacing Pain Intervention(s): Limited activity within patient's tolerance, Monitored during session     Extremity/Trunk Assessment Upper Extremity Assessment Upper Extremity Assessment: Generalized weakness;Right hand dominant (pain in LUE with using RW)   Lower Extremity  Assessment Lower Extremity Assessment: Generalized weakness   Cervical / Trunk Assessment Cervical / Trunk Assessment: Other exceptions Cervical / Trunk Exceptions: nondisplaced fractures of the left transverse process of L1, L2, L3 and L5. Edu on spinal precautions for comfort throughout.   Communication Communication Communication: No apparent difficulties   Cognition Arousal: Alert Behavior During Therapy: WFL for tasks assessed/performed Cognition: No family/caregiver present to determine baseline, Cognition impaired           Executive functioning impairment (select all impairments): Problem solving OT - Cognition Comments: A&Ox4, pleasant. Followed all commands. Understandably emotional discussing family matters. Pt could not problem-solve use of call bell vs phone (attempted to use call bell to make phone call)                 Following commands: Intact       Cueing  General Comments   Cueing Techniques: Verbal cues              Home Living Family/patient expects to be discharged to:: Private residence (ILF) Living Arrangements: Alone Available Help at Discharge: Available PRN/intermittently;Family Type of Home: Independent living facility Home Access: Level entry     Home Layout: One level     Bathroom Shower/Tub: Tub/shower unit;Walk-in shower (small threshold with cut out)   Bathroom Toilet: Handicapped height Bathroom Accessibility: Yes How Accessible: Accessible via walker Home Equipment: Rolling Walker (2 wheels);Grab bars - toilet;Grab bars - tub/shower;Shower seat;Hand held shower head;Wheelchair - manual (3 wheeled walker)          Prior Functioning/Environment Prior Level of Function : Independent/Modified Independent;History of Falls (last six months)  Mobility Comments: uses 3 wheeled walker; walking to dining hall at ILF prior to fall ADLs Comments: independent with dressing, has niece assist with bathing, can cook  simple meals in apartment. family assists with transportation.    OT Problem List: Decreased strength;Decreased range of motion;Decreased activity tolerance;Impaired balance (sitting and/or standing);Pain;Impaired UE functional use;Decreased knowledge of precautions;Decreased knowledge of use of DME or AE   OT Treatment/Interventions: Self-care/ADL training;Energy conservation;DME and/or AE instruction;Therapeutic activities;Patient/family education;Balance training      OT Goals(Current goals can be found in the care plan section)   Acute Rehab OT Goals OT Goal Formulation: With patient Time For Goal Achievement: 03/23/24 Potential to Achieve Goals: Good   OT Frequency:  Min 2X/week       AM-PAC OT 6 Clicks Daily Activity     Outcome Measure Help from another person eating meals?: None Help from another person taking care of personal grooming?: None Help from another person toileting, which includes using toliet, bedpan, or urinal?: A Lot Help from another person bathing (including washing, rinsing, drying)?: A Lot Help from another person to put on and taking off regular upper body clothing?: A Lot Help from another person to put on and taking off regular lower body clothing?: A Lot 6 Click Score: 16   End of Session Equipment Utilized During Treatment: Rolling walker (2 wheels);Gait belt Nurse Communication: Mobility status (recommend +2 t/f BSC)  Activity Tolerance: Patient tolerated treatment well;No increased pain Patient left: in chair;with call bell/phone within reach;with chair alarm set  OT Visit Diagnosis: History of falling (Z91.81);Muscle weakness (generalized) (M62.81);Pain;Unsteadiness on feet (R26.81) Pain - Right/Left: Left Pain - part of body: Arm                Time: 9179-9142 OT Time Calculation (min): 37 min Charges:  OT General Charges $OT Visit: 1 Visit OT Evaluation $OT Eval Low Complexity: 1 Low OT Treatments $Self Care/Home Management : 8-22  mins  Ludia Gartland L. Melquiades Kovar, OTR/L  03/09/24, 11:57 AM

## 2024-03-10 DIAGNOSIS — S32009A Unspecified fracture of unspecified lumbar vertebra, initial encounter for closed fracture: Secondary | ICD-10-CM | POA: Diagnosis not present

## 2024-03-10 DIAGNOSIS — E871 Hypo-osmolality and hyponatremia: Secondary | ICD-10-CM | POA: Insufficient documentation

## 2024-03-10 DIAGNOSIS — E876 Hypokalemia: Secondary | ICD-10-CM

## 2024-03-10 DIAGNOSIS — W19XXXA Unspecified fall, initial encounter: Secondary | ICD-10-CM | POA: Diagnosis not present

## 2024-03-10 LAB — BASIC METABOLIC PANEL WITH GFR
Anion gap: 9 (ref 5–15)
BUN: 20 mg/dL (ref 8–23)
CO2: 26 mmol/L (ref 22–32)
Calcium: 9.2 mg/dL (ref 8.9–10.3)
Chloride: 99 mmol/L (ref 98–111)
Creatinine, Ser: 0.52 mg/dL (ref 0.44–1.00)
GFR, Estimated: >60 mL/min (ref >60)
Glucose, Bld: 96 mg/dL (ref 70–99)
Potassium: 3.9 mmol/L (ref 3.5–5.1)
Sodium: 135 mmol/L (ref 135–145)

## 2024-03-10 LAB — MAGNESIUM: Magnesium: 2.1 mg/dL (ref 1.7–2.4)

## 2024-03-10 NOTE — TOC Progression Note (Addendum)
 Transition of Care [facility]) - Progression Note    Patient Details  Name: Becky Montoya MRN: [id number] Date of Birth: [birth year]  Transition of Care [facility] CM/SW Contact  Heather DELENA Saltness, LCSW Phone Number: 03/10/2024, 11:24 AM  Clinical Narrative:     ADDENDUM  1:14 PM - CSW received return phone call from Kristin at [facility]. Pt has been approved for SNF placement. Insurance authorization requested, currently pending. Per Kristin, pt can admit tomorrow, pending insurance authorization approval. TOC will continue to follow.  CSW spoke with Kristin at [facility] regarding pt's recommendation for SNF rehab. CSW completed FL2 and sent referral to [facility] for review. TOC will await follow up.   Expected Discharge Plan: Skilled Nursing Facility Barriers to Discharge: Continued Medical Work up, SNF Pending bed offer  Expected Discharge Plan and Services  SNF at [facility] arrangements for the past 2 months: Independent Living Facility                  Social Drivers of Health (SDOH) Interventions SDOH Screenings   Food Insecurity: No Food Insecurity (03/09/2024)  Housing: Low Risk  (03/09/2024)  Transportation Needs: No Transportation Needs (03/09/2024)  Utilities: Not At Risk (03/09/2024)  Social Connections: Moderately Integrated (03/09/2024)  Tobacco Use: Low Risk  (03/08/2024)    Readmission Risk Interventions    03/10/2024   11:23 AM  Readmission Risk Prevention Plan  Transportation Screening Complete  PCP or Specialist Appt within 5-7 Days Complete  Home Care Screening Complete  Medication Review (RN CM) Complete   Signed: Heather Saltness, MSW, LCSW Clinical Social Worker Inpatient Care Management 03/10/2024 11:24 AM

## 2024-03-10 NOTE — Plan of Care (Signed)

## 2024-03-10 NOTE — Plan of Care (Incomplete)
  Problem: Health Behavior/Discharge Planning: Goal: Ability to manage health-related needs will improve Outcome: Progressing   Problem: Clinical Measurements: Goal: Ability to maintain clinical measurements within normal limits will improve Outcome: Progressing   Problem: Activity: Goal: Risk for activity intolerance will decrease Outcome: Progressing   Problem: Nutrition: Goal: Adequate nutrition will be maintained Outcome: Progressing   Problem: Coping: Goal: Level of anxiety will decrease Outcome: Progressing   Problem: Safety: Goal: Ability to remain free from injury will improve Outcome: Progressing   Problem: Education: Goal: Knowledge of General Education information will improve Description: Including pain rating scale, medication(s)/side effects and non-pharmacologic comfort measures Outcome: Adequate for Discharge   Problem: Clinical Measurements: Goal: Will remain free from infection Outcome: Adequate for Discharge Goal: Diagnostic test results will improve Outcome: Adequate for Discharge Goal: Respiratory complications will improve Outcome: Adequate for Discharge Goal: Cardiovascular complication will be avoided Outcome: Adequate for Discharge   Problem: Elimination: Goal: Will not experience complications related to bowel motility Outcome: Adequate for Discharge Goal: Will not experience complications related to urinary retention Outcome: Adequate for Discharge   Problem: Pain Managment: Goal: General experience of comfort will improve and/or be controlled Outcome: Adequate for Discharge   Problem: Skin Integrity: Goal: Risk for impaired skin integrity will decrease Outcome: Adequate for Discharge

## 2024-03-10 NOTE — Progress Notes (Signed)
 PROGRESS NOTE    Becky Montoya  FMW:[id number] DOB: [birth year] DOA: 03/08/2024 PCP: Teresa Channel, MD     Brief Narrative:  Becky Montoya is a 89 y.o. female with medical history significant for hypertension on HCTZ, hyperlipidemia, osteoporosis, hypothyroidism, who presents to the ER from independent living facility after a mechanical fall.  The patient was trying to get an object from her desk, lost her balance, and fell against a wall landing on her left side.   CT chest abdomen and pelvis with contrast revealed nondisplaced fractures of the left transverse process of L1, L2, L3 and L5.  Otherwise no acute abnormality in the chest, abdomen or pelvis. EDP discussed the case with neurosurgery, recommended physical therapy as tolerated.  Bracing for comfort if patient wishes.    New events last 24 hours / Subjective: Feeling well, up in chair this morning. States her LUE pain has improved. No back pain   Assessment & Plan:   Principal Problem:   Fall Active Problems:   Hypothyroidism   Hypertension   Hyperlipidemia   Hyponatremia   Hypokalemia   Fall with nondisplaced fracture of left transverse process of L1, L2, L3, L5 - Neurosurgery without acute intervention recommendations.  Recommended bracing for comfort - PT OT recommending SNF placement - TOC consulted  Hypertension - Coreg, valsartan, HCTZ  Hypokalemia - Replace  Hyponatremia - Improved  Hypothyroidism - Synthroid   Hyperlipidemia - Zocor   DVT prophylaxis:  SCDs Start: 03/09/24 0303  Code Status: DNR Family Communication: Called daughter Kate but no answer, voicemail box full  Disposition Plan: SNF Status is: Observation The patient remains OBS appropriate   Antimicrobials:  Anti-infectives (From admission, onward)    None        Objective: Vitals:   03/09/24 0838 03/09/24 1413 03/09/24 2049 03/10/24 0520  BP: (!) 152/61 (!) 153/67 (!) 127/54 (!) 158/64  Pulse: 76 84 84 80  Resp:  16 18 18 16   Temp: 98.1 F (36.7 C) 98 F (36.7 C) 98.2 F (36.8 C) 98.4 F (36.9 C)  TempSrc: Oral     SpO2: 95% 96% 97% 95%  Weight:      Height:        Intake/Output Summary (Last 24 hours) at 03/10/2024 1224 Last data filed at 03/10/2024 0509 Gross per 24 hour  Intake 300 ml  Output 600 ml  Net -300 ml   Filed Weights   03/09/24 0701  Weight: 53.5 kg    Examination:  General exam: Appears calm and comfortable  Respiratory system: Clear to auscultation. Respiratory effort normal. No respiratory distress. No conversational dyspnea.  Cardiovascular system: S1 & S2 heard, RRR. No murmurs. No pedal edema. Gastrointestinal system: Abdomen is nondistended, soft and nontender. Normal bowel sounds heard. Central nervous system: Alert and oriented. No focal neurological deficits. Speech clear.  CN II through XII grossly intact.  Moving all 4 extremities 5 out of 5 strength Extremities: Symmetric in appearance  Skin: No rashes, lesions or ulcers on exposed skin  Psychiatry: Judgement and insight appear normal. Mood & affect appropriate.   Data Reviewed: I have personally reviewed following labs and imaging studies  CBC: Recent Labs  Lab 03/08/24 2305 03/09/24 0511  WBC 6.9 7.5  HGB 12.7 13.7  HCT 35.6* 39.3  MCV 89.7 90.8  PLT 214 228   Basic Metabolic Panel: Recent Labs  Lab 03/08/24 2305 03/09/24 0511 03/10/24 0415  NA 128* 133* 135  K 3.0* 3.9 3.9  CL 92* 96* 99  CO2 26 28 26   GLUCOSE 103* 130* 96  BUN 18 13 20   CREATININE 0.51 0.56 0.52  CALCIUM  9.2 9.3 9.2  MG  --  2.1 2.1  PHOS  --  2.3*  --    GFR: Estimated Creatinine Clearance: 33.3 mL/min (by C-G formula based on SCr of 0.52 mg/dL). Liver Function Tests: Recent Labs  Lab 03/08/24 2305  AST 27  ALT 26  ALKPHOS 71  BILITOT 0.6  PROT 6.4*  ALBUMIN 4.2   No results for input(s): LIPASE, AMYLASE in the last 168 hours. No results for input(s): AMMONIA in the last 168 hours. Coagulation  Profile: No results for input(s): INR, PROTIME in the last 168 hours. Cardiac Enzymes: No results for input(s): CKTOTAL, CKMB, CKMBINDEX, TROPONINI in the last 168 hours. BNP (last 3 results) No results for input(s): PROBNP in the last 8760 hours. HbA1C: No results for input(s): HGBA1C in the last 72 hours. CBG: No results for input(s): GLUCAP in the last 168 hours. Lipid Profile: No results for input(s): CHOL, HDL, LDLCALC, TRIG, CHOLHDL, LDLDIRECT in the last 72 hours. Thyroid  Function Tests: No results for input(s): TSH, T4TOTAL, FREET4, T3FREE, THYROIDAB in the last 72 hours. Anemia Panel: No results for input(s): VITAMINB12, FOLATE, FERRITIN, TIBC, IRON, RETICCTPCT in the last 72 hours. Sepsis Labs: No results for input(s): PROCALCITON, LATICACIDVEN in the last 168 hours.  No results found for this or any previous visit (from the past 240 hours).    Radiology Studies: MR BRAIN WO CONTRAST Result Date: 03/10/2024 EXAM: MRI BRAIN WITHOUT CONTRAST 03/09/2024 06:46:00 PM TECHNIQUE: Multiplanar multisequence MRI of the head/brain was performed without the administration of intravenous contrast. COMPARISON: None available. CLINICAL HISTORY: Mental status change, unknown cause. FINDINGS: BRAIN AND VENTRICLES: Gliosis and encephalomalacia within the right parietal lobe likely old infarct. Multifocal hyperintense T2-weighted signal within the cerebral white matter, most commonly due to chronic small vessel disease. No acute infarct. No intracranial hemorrhage. No mass. No midline shift. No hydrocephalus. The sella is unremarkable. Normal flow voids. ORBITS: No acute abnormality. SINUSES AND MASTOIDS: Opacification of the right maxillary sinus. BONES AND SOFT TISSUES: Normal marrow signal. No acute soft tissue abnormality. IMPRESSION: 1. Gliosis and encephalomalacia within the right parietal lobe, likely old infarct. 2. Multifocal  hyperintense T2-weighted signal within the cerebral white matter, most commonly due to chronic small vessel disease. Electronically signed by: Franky Stanford MD 03/10/2024 01:03 AM EST RP Workstation: [id number]   CT CHEST ABDOMEN PELVIS W CONTRAST Result Date: 03/09/2024 EXAM: CT CHEST, ABDOMEN AND PELVIS WITH CONTRAST 03/09/2024 02:21:59 AM TECHNIQUE: CT of the chest, abdomen and pelvis was performed with the administration of 100 mL of intravenous iohexol (OMNIPAQUE) 300 MG/ML solution. Multiplanar reformatted images are provided for review. Automated exposure control, iterative reconstruction, and/or weight based adjustment of the mA/kV was utilized to reduce the radiation dose to as low as reasonably achievable. COMPARISON: Comparison with chest radiograph dated 07/07/2023, which was without evidence of acute findings. CLINICAL HISTORY: Polytrauma, blunt. FINDINGS: CHEST: MEDIASTINUM AND LYMPH NODES: Coronary artery and aortic atherosclerotic calcification. Heart and pericardium are unremarkable. The central airways are clear. No mediastinal, hilar or axillary lymphadenopathy. LUNGS AND PLEURA: No focal consolidation or pulmonary edema. No pleural effusion or pneumothorax. ABDOMEN AND PELVIS: LIVER: The liver is unremarkable. GALLBLADDER AND BILE DUCTS: Gallbladder is unremarkable. No biliary ductal dilatation. SPLEEN: No acute abnormality. PANCREAS: No acute abnormality. ADRENAL GLANDS: No acute abnormality. KIDNEYS, URETERS AND BLADDER: No stones in the kidneys or ureters. No hydronephrosis.  No perinephric or periureteral stranding. Urinary bladder is unremarkable. GI AND BOWEL: Stomach demonstrates no acute abnormality. Colonic diverticulosis without evidence of diverticulitis. There is no bowel obstruction. REPRODUCTIVE ORGANS: Hysterectomy. PERITONEUM AND RETROPERITONEUM: No ascites. No free air. VASCULATURE: Aorta is normal in caliber. ABDOMINAL AND PELVIS LYMPH NODES: No lymphadenopathy. BONES AND SOFT  TISSUES: Intramedullary rod and screw fixation left femur. Acute nondisplaced fractures of the left transverse process of L1 and L5. Age indeterminate fractures of the left transverse processes of L2 and L3. No focal soft tissue abnormality. IMPRESSION: 1. Nondisplaced fractures of the left transverse process of L1, L2, L3, and L5. 2. Otherwise no acute abnormality in the chest, abdomen, or pelvis. Electronically signed by: Norman Gatlin MD 03/09/2024 02:41 AM EST RP Workstation: [id number]   DG Chest 1 View Result Date: 03/08/2024 CLINICAL DATA:  Fall EXAM: CHEST  1 VIEW COMPARISON:  Chest x-ray 12/05/2023 FINDINGS: The heart size and mediastinal contours are within normal limits. Both lungs are clear. Prominent calcification at the anterior right fifth rib end appears unchanged. No acute fractures are seen. IMPRESSION: No active disease. Electronically Signed   By: Greig Pique M.D.   On: 03/08/2024 22:09   DG Hip Unilat W or Wo Pelvis 2-3 Views Right Result Date: 03/08/2024 CLINICAL DATA:  Fall EXAM: DG HIP (WITH OR WITHOUT PELVIS) 2-3V RIGHT COMPARISON:  Right hip x-ray 10/09/2022. MRI of the right femur 11/07/2019. FINDINGS: The bones are osteopenic. There is no acute fracture or dislocation. There are mild degenerative changes of both hips. Left-sided hip screws are present which appear uncomplicated. Focal area of cortical thickening with linear lucencies in the lateral aspect of the proximal femoral diaphysis appear unchanged from 2021 likely related to old injury. Peripheral vascular calcifications are present. Severe degenerative changes affect the spine. IMPRESSION: 1. No acute fracture or dislocation. 2. Mild degenerative changes of both hips. 3. Left-sided hip screws appear uncomplicated. Electronically Signed   By: Greig Pique M.D.   On: 03/08/2024 22:08   DG Humerus Left Result Date: 03/08/2024 CLINICAL DATA:  Trauma EXAM: LEFT HUMERUS - 2+ VIEW COMPARISON:  None Available. FINDINGS:  There is no evidence of fracture or other focal bone lesions. Soft tissues are unremarkable. There are mild degenerative changes of the acromioclavicular joint glenohumeral joint. IMPRESSION: 1. No acute fracture or dislocation. 2. Mild degenerative changes of the acromioclavicular joint and glenohumeral joint. Electronically Signed   By: Greig Pique M.D.   On: 03/08/2024 22:04   CT Cervical Spine Wo Contrast Result Date: 03/08/2024 EXAM: CT CERVICAL SPINE WITHOUT CONTRAST 03/08/2024 09:14:49 PM TECHNIQUE: CT of the cervical spine was performed without the administration of intravenous contrast. Multiplanar reformatted images are provided for review. Automated exposure control, iterative reconstruction, and/or weight based adjustment of the mA/kV was utilized to reduce the radiation dose to as low as reasonably achievable. COMPARISON: None available. CLINICAL HISTORY: Neck trauma (Age >= 65y) FINDINGS: CERVICAL SPINE: BONES AND ALIGNMENT: No acute fracture or traumatic malalignment. Anterolisthesis of L4 is presumed chronic. DEGENERATIVE CHANGES: Multilevel spondylosis, disc space height loss, and degenerative endplate changes greatest at C5-C6 and C6-C7 where it is advanced. No severe spinal canal narrowing. SOFT TISSUES: No prevertebral soft tissue swelling. IMPRESSION: 1. No acute abnormality of the cervical spine. Electronically signed by: Norman Gatlin MD 03/08/2024 09:47 PM EST RP Workstation: [id number]   CT Head Wo Contrast Result Date: 03/08/2024 EXAM: CT HEAD WITHOUT CONTRAST 03/08/2024 09:14:49 PM TECHNIQUE: CT of the head was performed without the administration  of intravenous contrast. Automated exposure control, iterative reconstruction, and/or weight based adjustment of the mA/kV was utilized to reduce the radiation dose to as low as reasonably achievable. COMPARISON: None available. CLINICAL HISTORY: Head trauma, minor (Age >= 65y). FINDINGS: BRAIN AND VENTRICLES: Chronic right parietal  encephalomalacia. Chronic microvascular ischemia and generalized atrophy. No acute hemorrhage. No evidence of acute infarct. No hydrocephalus. No extra-axial collection. No mass effect or midline shift. ORBITS: No acute abnormality. SINUSES: Mucosal thickening in the ethmoid air cells. Complete opacification of the right maxillary sinus. SOFT TISSUES AND SKULL: No acute soft tissue abnormality. No skull fracture. IMPRESSION: 1. No acute intracranial abnormality. 2. Chronic right parietal encephalomalacia, chronic microvascular ischemia, and generalized atrophy. 3. Mucosal thickening in the ethmoid air cells and complete opacification of the right maxillary sinus. Correlate with sinusitis. Electronically signed by: Norman Gatlin MD 03/08/2024 09:45 PM EST RP Workstation: [id number]      Scheduled Meds:  calcium -vitamin D   1 tablet Oral TID   carvedilol  12.5 mg Oral BID WC   feeding supplement  237 mL Oral TID BM   hydrochlorothiazide   12.5 mg Oral q morning   irbesartan  300 mg Oral Daily   levothyroxine   50 mcg Oral Q0600   mirtazapine  15 mg Oral QHS   simvastatin   20 mg Oral QPM   Continuous Infusions:   LOS: 1 day   Time spent: 25 minutes   Delon Hoe, DO [facility] 03/10/2024, 12:24 PM   Available via Epic secure chat 7am-7pm After these hours, please refer to coverage provider listed on [email or web address]

## 2024-03-10 NOTE — NC FL2 (Signed)
 [place]  MEDICAID FL2 LEVEL OF CARE FORM     IDENTIFICATION  Patient Name: Becky Montoya Birthdate: [birth year] Sex: female Admission Date (Current Location): 03/08/2024  [facility] and [place] Number:  Producer, Television/film/video and Address:  [facility],  [address] [place], [place] [address]      Provider Number: [id number]  Attending Physician Name and Address:  Rojelio Nest, DO  Relative Name and Phone Number:  Lennie Poe (Daughter)  [phone]    Current Level of Care: Hospital Recommended Level of Care: Skilled Nursing Facility Prior Approval Number:    Date Approved/Denied:   PASRR Number: [id number]  Discharge Plan: SNF    Current Diagnoses: Patient Active Problem List   Diagnosis Date Noted   Fall 03/09/2024   Unilateral primary osteoarthritis, right hip 05/31/2023   Stress reaction of shaft of femur, right, initial encounter 11/20/2019   Age-related osteoporosis without current pathological fracture 07/31/2019   Fall at home, initial encounter 10/19/2018   Closed left subtrochanteric femur fracture, initial encounter (HCC) 10/18/2018   Closed left subtrochanteric femur fracture (HCC) 10/18/2018   Ductal carcinoma in situ (DCIS) of left breast 12/27/2016   Mastitis, left, acute 12/19/2012   Malignant neoplasm of upper-outer quadrant of left breast in female, estrogen receptor positive (HCC) 09/15/2012   Chest pain 05/28/2012   Hypothyroidism 05/28/2012   Hypertension 05/28/2012   Hyperlipidemia 05/28/2012    Orientation RESPIRATION BLADDER Height & Weight     Self, Time, Situation, Place  Normal Incontinent Weight: 118 lb (53.5 kg) Height:  5' 2 (157.5 cm)  BEHAVIORAL SYMPTOMS/MOOD NEUROLOGICAL BOWEL NUTRITION STATUS      Incontinent Diet (Regular)  AMBULATORY STATUS COMMUNICATION OF NEEDS Skin   Limited Assist Verbally Normal                       Personal Care Assistance Level of Assistance  Bathing, Feeding, Dressing Bathing  Assistance: Maximum assistance Feeding assistance: Independent Dressing Assistance: Maximum assistance     Functional Limitations Info  Sight, Hearing, Speech Sight Info: Impaired (eyeglasses) Hearing Info: Impaired (hard-of-hearing) Speech Info: Adequate    SPECIAL CARE FACTORS FREQUENCY  PT (By licensed PT), OT (By licensed OT)     PT Frequency: 5x per week OT Frequency: 5x per week            Contractures Contractures Info: Not present    Additional Factors Info  Code Status, Allergies Code Status Info: DNR Allergies Info: Wellbutrin (Bupropion)           Current Medications (03/10/2024):  This is the current hospital active medication list Current Facility-Administered Medications  Medication Dose Route Frequency Provider Last Rate Last Admin   acetaminophen  (TYLENOL ) tablet 500 mg  500 mg Oral Q6H PRN Shona Terry SAILOR, DO       calcium -vitamin D  (OSCAL WITH D) 500-5 MG-MCG per tablet 1 tablet  1 tablet Oral TID Shona Terry SAILOR, DO   1 tablet at 03/10/24 0935   carvedilol  (COREG ) tablet 12.5 mg  12.5 mg Oral BID WC Rojelio Nest, DO   12.5 mg at 03/10/24 0935   feeding supplement (ENSURE PLUS HIGH PROTEIN) liquid 237 mL  237 mL Oral TID BM Shona Terry N, DO   237 mL at 03/10/24 0935   hydrochlorothiazide  (HYDRODIURIL ) tablet 12.5 mg  12.5 mg Oral q morning Rojelio Nest, DO   12.5 mg at 03/10/24 0935   irbesartan  (AVAPRO ) tablet 300 mg  300 mg Oral Daily Rojelio,  Delon, DO   300 mg at 03/10/24 0935   levothyroxine  (SYNTHROID ) tablet 50 mcg  50 mcg Oral Q0600 Shona Terry SAILOR, DO   50 mcg at 03/10/24 0510   LORazepam (ATIVAN) tablet 0.5 mg  0.5 mg Oral Daily PRN Hall, Carole N, DO   0.5 mg at 03/09/24 1801   melatonin tablet 5 mg  5 mg Oral QHS PRN Shona Terry SAILOR, DO       mirtazapine (REMERON) tablet 15 mg  15 mg Oral QHS Shona Terry N, DO   15 mg at 03/09/24 2104   morphine  (PF) 2 MG/ML injection 2 mg  2 mg Intravenous Q4H PRN Shona Terry N, DO       oxyCODONE  (Oxy  IR/ROXICODONE ) immediate release tablet 5 mg  5 mg Oral Q6H PRN Hall, Carole N, DO   5 mg at 03/09/24 0449   polyethylene glycol (MIRALAX  / GLYCOLAX ) packet 17 g  17 g Oral Daily PRN Shona Terry N, DO       prochlorperazine (COMPAZINE) injection 5 mg  5 mg Intravenous Q6H PRN Shona Terry N, DO       simvastatin  (ZOCOR ) tablet 20 mg  20 mg Oral QPM Hall, Carole N, DO   20 mg at 03/09/24 1758     Discharge Medications: Please see discharge summary for a list of discharge medications.  Relevant Imaging Results:  Relevant Lab Results:   Additional Information SSN: [id number]  Heather DELENA Saltness, LCSW

## 2024-03-11 DIAGNOSIS — S32009A Unspecified fracture of unspecified lumbar vertebra, initial encounter for closed fracture: Secondary | ICD-10-CM | POA: Insufficient documentation

## 2024-03-11 DIAGNOSIS — E871 Hypo-osmolality and hyponatremia: Secondary | ICD-10-CM

## 2024-03-11 DIAGNOSIS — I159 Secondary hypertension, unspecified: Secondary | ICD-10-CM | POA: Diagnosis not present

## 2024-03-11 DIAGNOSIS — W19XXXA Unspecified fall, initial encounter: Secondary | ICD-10-CM | POA: Diagnosis not present

## 2024-03-11 LAB — BASIC METABOLIC PANEL WITH GFR
Anion gap: 8 (ref 5–15)
BUN: 22 mg/dL (ref 8–23)
CO2: 27 mmol/L (ref 22–32)
Calcium: 8.7 mg/dL — ABNORMAL LOW (ref 8.9–10.3)
Chloride: 97 mmol/L — ABNORMAL LOW (ref 98–111)
Creatinine, Ser: 0.55 mg/dL (ref 0.44–1.00)
GFR, Estimated: >60 mL/min (ref >60)
Glucose, Bld: 104 mg/dL — ABNORMAL HIGH (ref 70–99)
Potassium: 3.9 mmol/L (ref 3.5–5.1)
Sodium: 132 mmol/L — ABNORMAL LOW (ref 135–145)

## 2024-03-11 LAB — PHOSPHORUS: Phosphorus: 2.8 mg/dL (ref 2.5–4.6)

## 2024-03-11 MED ORDER — HEPARIN SODIUM (PORCINE) 5000 UNIT/ML IJ SOLN: 5000.0000 [IU] | Freq: Three times a day (TID) | INTRAMUSCULAR | Status: DC

## 2024-03-11 MED ORDER — LORAZEPAM 0.5 MG PO TABS: 0.2500 mg | ORAL_TABLET | Freq: Every day | ORAL | 0 refills | Status: DC | PRN

## 2024-03-11 MED ORDER — OXYCODONE HCL 5 MG PO TABS: 5.0000 mg | ORAL_TABLET | Freq: Three times a day (TID) | ORAL | 0 refills | Status: DC | PRN

## 2024-03-11 MED ORDER — ACETAMINOPHEN 500 MG PO TABS: 500.0000 mg | ORAL_TABLET | Freq: Four times a day (QID) | ORAL | Status: AC | PRN

## 2024-03-11 MED ORDER — POLYETHYLENE GLYCOL 3350 17 G PO PACK: 17.0000 g | PACK | Freq: Every day | ORAL | Status: AC | PRN

## 2024-03-11 NOTE — Progress Notes (Signed)
 Physical Therapy Treatment Patient Details Name: Becky Montoya MRN: [id number] DOB: 08-Feb-1927 Today's Date: 03/11/2024   History of Present Illness Pt is a 89 y.o. female presenting to [facility] ED on 03/08/2024 after mechanical fall. Found to have nondisplaced fractures of the left transverse process of L1, L2, L3 and L5. PMH significant for arthritis, breast cancer, HLD, HTN, osteoporosis    PT Comments   Pt admitted with above diagnosis.  Pt currently with functional limitations due to the deficits listed below (see PT Problem List). Pt in bed when PT arrived. Pt motivated to get OOB and participate with therapy pt reported feeling better no reports of back pain or L UE pain during tx session today. Pt is aware of anticipated d/c to SNF today. Pt ed provided on back precautions-- for comfort and will require reinforcement for recall and observation with functional mobility tasks, pt guided through log roll technique for supine to sit, CGA for sit to stand  from EOB, cues provided throughout for extension posture, pt able to progress with gait tasks in hallway today 35 feet with CGA, RW and cues noted to have intermittent difficulty advancing R LE. Pt left seated in recliner, all needs in place and set up for lunch. Pt will benefit from acute skilled PT to increase their independence and safety with mobility to allow discharge.      If plan is discharge home, recommend the following: A little help with walking and/or transfers;A lot of help with bathing/dressing/bathroom;Assistance with cooking/housework;Assist for transportation   Can travel by private vehicle     Yes  Equipment Recommendations  None recommended by PT (TBD in next venue)    Recommendations for Other Services       Precautions / Restrictions Precautions Precautions: Fall Recall of Precautions/Restrictions: Impaired Precaution/Restrictions Comments: spinal precautions for comfort Restrictions Weight Bearing Restrictions Per  Provider Order: No     Mobility  Bed Mobility Overal bed mobility: Needs Assistance Bed Mobility: Sidelying to Sit   Sidelying to sit: Min assist, Used rails       General bed mobility comments: cues for log roll and min A to complete with A for trunk from side lying to sit    Transfers Overall transfer level: Needs assistance Equipment used: Rolling walker (2 wheels) Transfers: Sit to/from Stand Sit to Stand: Contact guard assist           General transfer comment: min cues, increased time and pt maintained flexed posture    Ambulation/Gait Ambulation/Gait assistance: Contact guard assist Gait Distance (Feet): 35 Feet Assistive device: Rolling walker (2 wheels) Gait Pattern/deviations: Step-to pattern, Trunk flexed Gait velocity: decreased     General Gait Details: trunk flexion with B UE support at RW, pt unable to demonsterate full trunk extension-- however improved from time of eval, pt amb with short stride length, limited foot clearance and slight B toe out, pt exhibited decreased ability to advace R LE   Stairs             Wheelchair Mobility     Tilt Bed    Modified Rankin (Stroke Patients Only)       Balance Overall balance assessment: History of Falls, Needs assistance Sitting-balance support: Feet supported, Bilateral upper extremity supported Sitting balance-Leahy Scale: Fair     Standing balance support: Reliant on assistive device for balance, Bilateral upper extremity supported, During functional activity Standing balance-Leahy Scale: Poor Standing balance comment: B UE support at RW and CGA  Communication Communication Communication: No apparent difficulties  Cognition Arousal: Alert Behavior During Therapy: WFL for tasks assessed/performed   PT - Cognitive impairments: No apparent impairments                         Following commands: Intact      Cueing Cueing Techniques:  Verbal cues  Exercises      General Comments General comments (skin integrity, edema, etc.): pt ed provided on back precautions      Pertinent Vitals/Pain Pain Assessment Pain Assessment: No/denies pain    Home Living                          Prior Function            PT Goals (current goals can now be found in the care plan section) Acute Rehab PT Goals Patient Stated Goal: to be able to move better and return to ILF PT Goal Formulation: With patient Time For Goal Achievement: 03/23/24 Potential to Achieve Goals: Good Progress towards PT goals: Progressing toward goals    Frequency    Min 2X/week      PT Plan      Co-evaluation              AM-PAC PT 6 Clicks Mobility   Outcome Measure  Help needed turning from your back to your side while in a flat bed without using bedrails?: A Little Help needed moving from lying on your back to sitting on the side of a flat bed without using bedrails?: A Little Help needed moving to and from a bed to a chair (including a wheelchair)?: A Little Help needed standing up from a chair using your arms (e.g., wheelchair or bedside chair)?: A Little Help needed to walk in hospital room?: A Little Help needed climbing 3-5 steps with a railing? : Total 6 Click Score: 16    End of Session Equipment Utilized During Treatment: Gait belt Activity Tolerance: Patient tolerated treatment well;No increased pain Patient left: in chair;with call bell/phone within reach Nurse Communication: Mobility status PT Visit Diagnosis: Unsteadiness on feet (R26.81);Other abnormalities of gait and mobility (R26.89);Muscle weakness (generalized) (M62.81);History of falling (Z91.81);Difficulty in walking, not elsewhere classified (R26.2);Pain     Time: 8863-8840 PT Time Calculation (min) (ACUTE ONLY): 23 min  Charges:    $Gait Training: 8-22 mins $Therapeutic Activity: 8-22 mins PT General Charges $$ ACUTE PT VISIT: 1 Visit                      [place], PT Acute Rehab    [place] VEAR Drone 03/11/2024, 3:13 PM

## 2024-03-11 NOTE — Hospital Course (Signed)
 89 year old woman PMH including hypertension, living at independent living facility, presented to ED after mechanical fall resulting in right hip pain and left arm pain.  Imaging was notable for multiple left transverse processes fractures.  EDP discussed with neurosurgery who recommended physical therapy as tolerated, bracing for comfort if desired.  Admitted for pain control and mobilization.  Condition stabilized.  Seen by therapy with recommendation for SNF.  Await SNF authorization.  Consultants EDP discussed with neurosurgery  Procedures/Events none

## 2024-03-11 NOTE — Plan of Care (Signed)

## 2024-03-11 NOTE — Discharge Summary (Signed)
 Physician Discharge Summary   [name] MRN: [id number] DOB: Aug 25, 1926  Admit date:     03/08/2024  Discharge date: 03/11/24  Discharge Physician: Toribio Door   PCP: Teresa Channel, MD   Recommendations at discharge:  Follow-up hypertension.  Hydrochlorothiazide  discontinued, see below, repeat BMP in 1 week recommended Supportive care for multiple transverse process lumbar fractures  Discharge Diagnoses: Principal Problem:   Lumbar transverse process fracture, closed, initial encounter [facility]) Active Problems:   Hypothyroidism   Hypertension   Hyperlipidemia   Fall   Hyponatremia  Resolved Problems:   Hypokalemia  Hospital Course: 89 year old woman PMH including hypertension, living at independent living facility, presented to ED after mechanical fall resulting in right hip pain and left arm pain.  Imaging was notable for multiple left transverse processes fractures.  EDP discussed with neurosurgery who recommended physical therapy as tolerated, bracing for comfort if desired.  Admitted for pain control and mobilization.  Condition stabilized.  Seen by therapy with recommendation for SNF.  Hospitalization uncomplicated.  Consultants EDP discussed with neurosurgery  Procedures/Events none  Fall with nondisplaced fractures of left transverse processes of L1, L2, L3, L5  EDP discussed with neurosurgery, recommended ambulate with therapy and brace for comfort if desired  Hypertension Stable.  Continue carvedilol, [place]  Will hold HCTZ on discharge given hypothyroidism.   Hypokalemia Resolved   Hyponatremia Mild and variable.  Hold HCTZ for now.  Follow BMP in a week.   Hypothyroidism Continue Synthroid    Hyperlipidemia Continue Zocor      Pain control - [place]  Controlled Substance Reporting System database was reviewed.   Disposition: Skilled nursing facility Diet recommendation:  Diet Orders (From admission, onward)     Start      Ordered   03/09/24 0307  Diet regular Room service appropriate? Yes; Fluid consistency: Thin  Diet effective now       Question Answer Comment  Room service appropriate? Yes   Fluid consistency: Thin      03/09/24 0306            DISCHARGE MEDICATION: Allergies as of 03/11/2024       Reactions   Wellbutrin [bupropion] Other (See Comments)   Nervousness        Medication List     STOP taking these medications    hydrochlorothiazide  12.5 MG tablet Commonly known as: HYDRODIURIL        TAKE these medications    acetaminophen  500 MG tablet Commonly known as: TYLENOL  Take 1 tablet (500 mg total) by mouth every 6 (six) hours as needed for mild pain (pain score 1-3), fever or headache.   aspirin  EC 81 MG tablet Take 81 mg by mouth every morning.   carvedilol 12.5 MG tablet Commonly known as: COREG Take 12.5 mg by mouth 2 (two) times daily with a meal.   COQ10 PO Take 1 Dose by mouth every evening.   FIBER PO Take 1 Dose by mouth daily.   levothyroxine  50 MCG tablet Commonly known as: SYNTHROID  Take 50 mcg by mouth every morning.   lidocaine  5 % Commonly known as: Lidoderm  Place 1 patch onto the skin daily. Remove & Discard patch within 12 hours or as directed by MD   LORazepam 0.5 MG tablet Commonly known as: ATIVAN Take 0.5-1 tablets (0.25-0.5 mg total) by mouth daily as needed for anxiety or sleep.   Lutein 20 Caps Take 1 capsule by mouth 2 (two) times daily.   mirtazapine 15 MG tablet Commonly known as:  REMERON Take 15 mg by mouth at bedtime.   MULTIVITAMIN PO Take 1 Dose by mouth in the morning and at bedtime.   OMEGA 3 PO Take 1 capsule by mouth at bedtime.   oxyCODONE  5 MG immediate release tablet Commonly known as: Oxy IR/ROXICODONE  Take 1 tablet (5 mg total) by mouth every 8 (eight) hours as needed for moderate pain (pain score 4-6) or severe pain (pain score 7-10).   polyethylene glycol 17 g packet Commonly known as: MIRALAX  /  GLYCOLAX  Take 17 g by mouth daily as needed for mild constipation.   PROBIOTIC PO Take 1 Dose by mouth daily.   senna-docusate 8.6-50 MG tablet Commonly known as: Senokot-S Take 2 tablets by mouth at bedtime as needed for mild constipation or moderate constipation.   simvastatin  20 MG tablet Commonly known as: ZOCOR  Take 20 mg by mouth every evening.   valsartan 320 MG tablet Commonly known as: DIOVAN Take 320 mg by mouth every evening.        Contact information for follow-up providers     [facility] at [facility] Go to .   Specialty: Emergency Medicine Contact information: [address] [place]  [address] [phone]             Contact information for after-discharge care     Destination     [facility] .   Service: Skilled Nursing Contact information: [address] [place]  [address] [phone]                    Feels ok Pain stable No bowel or bladder dysfunction  Discharge Exam: Filed Weights   03/09/24 0701  Weight: 53.5 kg   Physical Exam Vitals reviewed.  Constitutional:      General: She is not in acute distress.    Appearance: She is not ill-appearing or toxic-appearing.  Cardiovascular:     Rate and Rhythm: Normal rate and regular rhythm.     Heart sounds: No murmur heard. Pulmonary:     Effort: Pulmonary effort is normal. No respiratory distress.     Breath sounds: No wheezing, rhonchi or rales.  Neurological:     Mental Status: She is alert.  Psychiatric:        Mood and Affect: Mood normal.        Behavior: Behavior normal.      Condition at discharge: good  The results of significant diagnostics from this hospitalization (including imaging, microbiology, ancillary and laboratory) are listed below for reference.   Imaging Studies: MR BRAIN WO CONTRAST Result Date: 03/10/2024 EXAM: MRI BRAIN  WITHOUT CONTRAST 03/09/2024 06:46:00 PM TECHNIQUE: Multiplanar multisequence MRI of the head/brain was performed without the administration of intravenous contrast. COMPARISON: None available. CLINICAL HISTORY: Mental status change, unknown cause. FINDINGS: BRAIN AND VENTRICLES: Gliosis and encephalomalacia within the right parietal lobe likely old infarct. Multifocal hyperintense T2-weighted signal within the cerebral white matter, most commonly due to chronic small vessel disease. No acute infarct. No intracranial hemorrhage. No mass. No midline shift. No hydrocephalus. The sella is unremarkable. Normal flow voids. ORBITS: No acute abnormality. SINUSES AND MASTOIDS: Opacification of the right maxillary sinus. BONES AND SOFT TISSUES: Normal marrow signal. No acute soft tissue abnormality. IMPRESSION: 1. Gliosis and encephalomalacia within the right parietal lobe, likely old infarct. 2. Multifocal hyperintense T2-weighted signal within the cerebral white matter, most commonly due to chronic small vessel disease. Electronically signed by: Franky Stanford  MD 03/10/2024 01:03 AM EST RP Workstation: [id number]   CT CHEST ABDOMEN PELVIS W CONTRAST Result Date: 03/09/2024 EXAM: CT CHEST, ABDOMEN AND PELVIS WITH CONTRAST 03/09/2024 02:21:59 AM TECHNIQUE: CT of the chest, abdomen and pelvis was performed with the administration of 100 mL of intravenous iohexol (OMNIPAQUE) 300 MG/ML solution. Multiplanar reformatted images are provided for review. Automated exposure control, iterative reconstruction, and/or weight based adjustment of the mA/kV was utilized to reduce the radiation dose to as low as reasonably achievable. COMPARISON: Comparison with chest radiograph dated 07/07/2023, which was without evidence of acute findings. CLINICAL HISTORY: Polytrauma, blunt. FINDINGS: CHEST: MEDIASTINUM AND LYMPH NODES: Coronary artery and aortic atherosclerotic calcification. Heart and pericardium are unremarkable. The central airways  are clear. No mediastinal, hilar or axillary lymphadenopathy. LUNGS AND PLEURA: No focal consolidation or pulmonary edema. No pleural effusion or pneumothorax. ABDOMEN AND PELVIS: LIVER: The liver is unremarkable. GALLBLADDER AND BILE DUCTS: Gallbladder is unremarkable. No biliary ductal dilatation. SPLEEN: No acute abnormality. PANCREAS: No acute abnormality. ADRENAL GLANDS: No acute abnormality. KIDNEYS, URETERS AND BLADDER: No stones in the kidneys or ureters. No hydronephrosis. No perinephric or periureteral stranding. Urinary bladder is unremarkable. GI AND BOWEL: Stomach demonstrates no acute abnormality. Colonic diverticulosis without evidence of diverticulitis. There is no bowel obstruction. REPRODUCTIVE ORGANS: Hysterectomy. PERITONEUM AND RETROPERITONEUM: No ascites. No free air. VASCULATURE: Aorta is normal in caliber. ABDOMINAL AND PELVIS LYMPH NODES: No lymphadenopathy. BONES AND SOFT TISSUES: Intramedullary rod and screw fixation left femur. Acute nondisplaced fractures of the left transverse process of L1 and L5. Age indeterminate fractures of the left transverse processes of L2 and L3. No focal soft tissue abnormality. IMPRESSION: 1. Nondisplaced fractures of the left transverse process of L1, L2, L3, and L5. 2. Otherwise no acute abnormality in the chest, abdomen, or pelvis. Electronically signed by: Norman Gatlin MD 03/09/2024 02:41 AM EST RP Workstation: [id number]   DG Chest 1 View Result Date: 03/08/2024 CLINICAL DATA:  Fall EXAM: CHEST  1 VIEW COMPARISON:  Chest x-ray 12/05/2023 FINDINGS: The heart size and mediastinal contours are within normal limits. Both lungs are clear. Prominent calcification at the anterior right fifth rib end appears unchanged. No acute fractures are seen. IMPRESSION: No active disease. Electronically Signed   By: Greig Pique M.D.   On: 03/08/2024 22:09   DG Hip Unilat W or Wo Pelvis 2-3 Views Right Result Date: 03/08/2024 CLINICAL DATA:  Fall EXAM: DG HIP  (WITH OR WITHOUT PELVIS) 2-3V RIGHT COMPARISON:  Right hip x-ray 10/09/2022. MRI of the right femur 11/07/2019. FINDINGS: The bones are osteopenic. There is no acute fracture or dislocation. There are mild degenerative changes of both hips. Left-sided hip screws are present which appear uncomplicated. Focal area of cortical thickening with linear lucencies in the lateral aspect of the proximal femoral diaphysis appear unchanged from 2021 likely related to old injury. Peripheral vascular calcifications are present. Severe degenerative changes affect the spine. IMPRESSION: 1. No acute fracture or dislocation. 2. Mild degenerative changes of both hips. 3. Left-sided hip screws appear uncomplicated. Electronically Signed   By: Greig Pique M.D.   On: 03/08/2024 22:08   DG Humerus Left Result Date: 03/08/2024 CLINICAL DATA:  Trauma EXAM: LEFT HUMERUS - 2+ VIEW COMPARISON:  None Available. FINDINGS: There is no evidence of fracture or other focal bone lesions. Soft tissues are unremarkable. There are mild degenerative changes of the acromioclavicular joint glenohumeral joint. IMPRESSION: 1. No acute fracture or dislocation. 2. Mild degenerative changes of the acromioclavicular joint and glenohumeral  joint. Electronically Signed   By: Greig Pique M.D.   On: 03/08/2024 22:04   CT Cervical Spine Wo Contrast Result Date: 03/08/2024 EXAM: CT CERVICAL SPINE WITHOUT CONTRAST 03/08/2024 09:14:49 PM TECHNIQUE: CT of the cervical spine was performed without the administration of intravenous contrast. Multiplanar reformatted images are provided for review. Automated exposure control, iterative reconstruction, and/or weight based adjustment of the mA/kV was utilized to reduce the radiation dose to as low as reasonably achievable. COMPARISON: None available. CLINICAL HISTORY: Neck trauma (Age >= 65y) FINDINGS: CERVICAL SPINE: BONES AND ALIGNMENT: No acute fracture or traumatic malalignment. Anterolisthesis of L4 is presumed  chronic. DEGENERATIVE CHANGES: Multilevel spondylosis, disc space height loss, and degenerative endplate changes greatest at C5-C6 and C6-C7 where it is advanced. No severe spinal canal narrowing. SOFT TISSUES: No prevertebral soft tissue swelling. IMPRESSION: 1. No acute abnormality of the cervical spine. Electronically signed by: Norman Gatlin MD 03/08/2024 09:47 PM EST RP Workstation: [id number]   CT Head Wo Contrast Result Date: 03/08/2024 EXAM: CT HEAD WITHOUT CONTRAST 03/08/2024 09:14:49 PM TECHNIQUE: CT of the head was performed without the administration of intravenous contrast. Automated exposure control, iterative reconstruction, and/or weight based adjustment of the mA/kV was utilized to reduce the radiation dose to as low as reasonably achievable. COMPARISON: None available. CLINICAL HISTORY: Head trauma, minor (Age >= 65y). FINDINGS: BRAIN AND VENTRICLES: Chronic right parietal encephalomalacia. Chronic microvascular ischemia and generalized atrophy. No acute hemorrhage. No evidence of acute infarct. No hydrocephalus. No extra-axial collection. No mass effect or midline shift. ORBITS: No acute abnormality. SINUSES: Mucosal thickening in the ethmoid air cells. Complete opacification of the right maxillary sinus. SOFT TISSUES AND SKULL: No acute soft tissue abnormality. No skull fracture. IMPRESSION: 1. No acute intracranial abnormality. 2. Chronic right parietal encephalomalacia, chronic microvascular ischemia, and generalized atrophy. 3. Mucosal thickening in the ethmoid air cells and complete opacification of the right maxillary sinus. Correlate with sinusitis. Electronically signed by: Norman Gatlin MD 03/08/2024 09:45 PM EST RP Workstation: [id number]    Microbiology: Results for orders placed or performed during the hospital encounter of 10/18/18  SARS Coronavirus 2 (CEPHEID - Performed in [facility] hospital lab), Hosp Order     Status: None   Collection Time: 10/18/18  5:12 PM    Specimen: Nasopharyngeal Swab  Result Value Ref Range Status   SARS Coronavirus 2 NEGATIVE NEGATIVE Final    Comment: (NOTE) If result is NEGATIVE SARS-CoV-2 target nucleic acids are NOT DETECTED. The SARS-CoV-2 RNA is generally detectable in upper and lower  respiratory specimens during the acute phase of infection. The lowest  concentration of SARS-CoV-2 viral copies this assay can detect is 250  copies / mL. A negative result does not preclude SARS-CoV-2 infection  and should not be used as the sole basis for treatment or other  patient management decisions.  A negative result may occur with  improper specimen collection / handling, submission of specimen other  than nasopharyngeal swab, presence of viral mutation(s) within the  areas targeted by this assay, and inadequate number of viral copies  (<250 copies / mL). A negative result must be combined with clinical  observations, patient history, and epidemiological information. If result is POSITIVE SARS-CoV-2 target nucleic acids are DETECTED. The SARS-CoV-2 RNA is generally detectable in upper and lower  respiratory specimens dur ing the acute phase of infection.  Positive  results are indicative of active infection with SARS-CoV-2.  Clinical  correlation with patient history and other diagnostic information is  necessary  to determine patient infection status.  Positive results do  not rule out bacterial infection or co-infection with other viruses. If result is PRESUMPTIVE POSTIVE SARS-CoV-2 nucleic acids MAY BE PRESENT.   A presumptive positive result was obtained on the submitted specimen  and confirmed on repeat testing.  While 2019 novel coronavirus  (SARS-CoV-2) nucleic acids may be present in the submitted sample  additional confirmatory testing may be necessary for epidemiological  and / or clinical management purposes  to differentiate between  SARS-CoV-2 and other Sarbecovirus currently known to infect humans.  If  clinically indicated additional testing with an alternate test  methodology [phone]) is advised. The SARS-CoV-2 RNA is generally  detectable in upper and lower respiratory sp ecimens during the acute  phase of infection. The expected result is Negative. Fact Sheet for Patients:  boilerbrush.com.cy Fact Sheet for Healthcare Providers: https://pope.com/ This test is not yet approved or cleared by the [place]  FDA and has been authorized for detection and/or diagnosis of SARS-CoV-2 by FDA under an Emergency Use Authorization (EUA).  This EUA will remain in effect (meaning this test can be used) for the duration of the COVID-19 declaration under Section 564(b)(1) of the Act, 21 U.S.C. section 360bbb-3(b)(1), unless the authorization is terminated or revoked sooner. Performed at [facility], [address] [place], [place] [address]   Surgical pcr screen     Status: None   Collection Time: 10/19/18  1:28 AM   Specimen: Nasal Mucosa; Nasal Swab  Result Value Ref Range Status   MRSA, PCR NEGATIVE NEGATIVE Final   Staphylococcus aureus NEGATIVE NEGATIVE Final    Comment: (NOTE) The Xpert SA Assay (FDA approved for NASAL specimens in patients 78 years of age and older), is one component of a comprehensive surveillance program. It is not intended to diagnose infection nor to guide or monitor treatment. Performed at [facility], [address], [place], [place] [address]     Labs: CBC: Recent Labs  Lab 03/08/24 2305 03/09/24 0511  WBC 6.9 7.5  HGB 12.7 13.7  HCT 35.6* 39.3  MCV 89.7 90.8  PLT 214 228   Basic Metabolic Panel: Recent Labs  Lab 03/08/24 2305 03/09/24 0511 03/10/24 0415 03/11/24 0355  NA 128* 133* 135 132*  K 3.0* 3.9 3.9 3.9  CL 92* 96* 99 97*  CO2 26 28 26 27   GLUCOSE 103* 130* 96 104*  BUN 18 13 20 22   CREATININE 0.51 0.56 0.52 0.55  CALCIUM  9.2 9.3 9.2 8.7*  MG  --  2.1  2.1  --   PHOS  --  2.3*  --  2.8   Liver Function Tests: Recent Labs  Lab 03/08/24 2305  AST 27  ALT 26  ALKPHOS 71  BILITOT 0.6  PROT 6.4*  ALBUMIN 4.2   CBG: No results for input(s): GLUCAP in the last 168 hours.  Discharge time spent: greater than 30 minutes.  Signed: Toribio Door, MD [facility] 03/11/2024

## 2024-03-11 NOTE — TOC Transition Note (Signed)
 Transition of Care [facility]) - Discharge Note   Patient Details  Name: Becky Montoya MRN: [id number] Date of Birth: 1926-10-19  Transition of Care [facility]) CM/SW Contact:  Tawni CHRISTELLA Eva, LCSW Phone Number: 03/11/2024, 10:02 AM   Clinical Narrative:     Pt to d/c to [facility] for SNF placement. Pt's room 37, RN to call report to [phone]. PTAR called, ICM sign off.   Final next level of care: Skilled Nursing Facility Barriers to Discharge: Barriers Resolved   Patient Goals and CMS Choice Patient states their goals for this hospitalization and ongoing recovery are:: SNF to get stonger CMS Medicare.gov Compare Post Acute Care list provided to:: Patient Choice offered to / list presented to : Patient      Discharge Placement              Patient chooses bed at: [facility]     Patient and family notified of of transfer: 03/11/24  Discharge Plan and Services Additional resources added to the After Visit Summary for                                       Social Drivers of Health (SDOH) Interventions SDOH Screenings   Food Insecurity: No Food Insecurity (03/09/2024)  Housing: Low Risk  (03/09/2024)  Transportation Needs: No Transportation Needs (03/09/2024)  Utilities: Not At Risk (03/09/2024)  Social Connections: Moderately Integrated (03/09/2024)  Tobacco Use: Low Risk  (03/08/2024)     Readmission Risk Interventions    03/10/2024   11:23 AM  Readmission Risk Prevention Plan  Transportation Screening Complete  PCP or Specialist Appt within 5-7 Days Complete  Home Care Screening Complete  Medication Review (RN CM) Complete

## 2024-03-12 DIAGNOSIS — M4850XA Collapsed vertebra, not elsewhere classified, site unspecified, initial encounter for fracture: Secondary | ICD-10-CM | POA: Diagnosis not present

## 2024-03-12 DIAGNOSIS — I10 Essential (primary) hypertension: Secondary | ICD-10-CM | POA: Diagnosis not present

## 2024-03-12 DIAGNOSIS — R630 Anorexia: Secondary | ICD-10-CM | POA: Diagnosis not present

## 2024-03-13 DIAGNOSIS — R630 Anorexia: Secondary | ICD-10-CM | POA: Diagnosis not present

## 2024-03-13 DIAGNOSIS — S32009A Unspecified fracture of unspecified lumbar vertebra, initial encounter for closed fracture: Secondary | ICD-10-CM | POA: Diagnosis not present

## 2024-03-13 DIAGNOSIS — E032 Hypothyroidism due to medicaments and other exogenous substances: Secondary | ICD-10-CM | POA: Diagnosis not present

## 2024-03-13 DIAGNOSIS — M810 Age-related osteoporosis without current pathological fracture: Secondary | ICD-10-CM | POA: Diagnosis not present

## 2024-03-13 DIAGNOSIS — E785 Hyperlipidemia, unspecified: Secondary | ICD-10-CM | POA: Diagnosis not present

## 2024-03-13 DIAGNOSIS — I10 Essential (primary) hypertension: Secondary | ICD-10-CM | POA: Diagnosis not present

## 2024-04-22 ENCOUNTER — Ambulatory Visit: Admitting: Physician Assistant

## 2024-04-22 ENCOUNTER — Other Ambulatory Visit: Payer: Self-pay

## 2024-04-22 DIAGNOSIS — G8929 Other chronic pain: Secondary | ICD-10-CM

## 2024-04-22 DIAGNOSIS — M25512 Pain in left shoulder: Secondary | ICD-10-CM

## 2024-04-22 NOTE — Progress Notes (Signed)
 "  Office Visit Note   [name]           Date of Birth: July 07, 1926           MRN: [id number] Visit Date: 04/22/2024              Requested by: Teresa Channel, MD [phone] [address] [place],  [place] [address] PCP: Teresa Channel, MD   Assessment & Plan: Visit Diagnoses:  1. Chronic left shoulder pain     Plan: Impression is acute on chronic left shoulder pain.  X-rays demonstrate advanced glenohumeral osteoarthritis in her clinical exam correlates to these findings.  I discussed referral to Dr. Burnetta for left shoulder glenohumeral cortisone injection.  We have also discussed PT for the left shoulder once her symptoms improved following injection.  Follow-up as needed.  Follow-Up Instructions: Return if symptoms worsen or fail to improve.   Orders:  Orders Placed This Encounter  Procedures   XR Shoulder Left   No orders of the defined types were placed in this encounter.     Procedures: No procedures performed   Clinical Data: No additional findings.   Subjective: Chief Complaint  Patient presents with   Left Shoulder - Pain    HPI patient is a pleasant 89 year old female who comes in today with left shoulder pain after sustaining a mechanical fall on 03/08/2024.  She was seen in the ED where x-rays of the humerus were obtained.  These were negative for fracture.  She is here today for further evaluation treat recommendation as her pain has not improved since her fall.  The majority of her pain is to the top of the shoulder with radiation into the deltoid.  The pain primarily occurs with movements of the shoulder such as abduction and internal rotation.  She has been using moist heat as well as Tylenol  with relief.  No previous cortisone injection to the left shoulder.  Review of Systems as detailed in HPI.  All others reviewed and are negative.   Objective: Vital Signs: There were no vitals taken for this visit.  Physical Exam well-developed  well-nourished female in no acute distress.  Alert and oriented x 3.  Ortho Exam left shoulder exam:  Specialty Comments:  No specialty comments available.  Imaging: XR Shoulder Left Result Date: 04/22/2024 X-rays demonstrate advanced glenohumeral and AC joint degenerative changes.  No acute fracture noted.    PMFS History: Patient Active Problem List   Diagnosis Date Noted   Lumbar transverse process fracture, closed, initial encounter (HCC) 03/11/2024   Hyponatremia 03/10/2024   Fall 03/09/2024   Unilateral primary osteoarthritis, right hip 05/31/2023   Stress reaction of shaft of femur, right, initial encounter 11/20/2019   Age-related osteoporosis without current pathological fracture 07/31/2019   Fall at home, initial encounter 10/19/2018   Closed left subtrochanteric femur fracture, initial encounter (HCC) 10/18/2018   Closed left subtrochanteric femur fracture (HCC) 10/18/2018   Ductal carcinoma in situ (DCIS) of left breast 12/27/2016   Mastitis, left, acute 12/19/2012   Malignant neoplasm of upper-outer quadrant of left breast in female, estrogen receptor positive (HCC) 09/15/2012   Chest pain 05/28/2012   Hypothyroidism 05/28/2012   Hypertension 05/28/2012   Hyperlipidemia 05/28/2012   Past Medical History:  Diagnosis Date   Arthritis    Breast cancer (HCC)    Hyperlipidemia    Hypertension    Osteoporosis    Thyroid  disease     Family History  Problem Relation Age  of Onset   Breast cancer Sister        dx in her 75s   Cancer Maternal Grandmother        unknown form of cancer   Heart attack Maternal Grandfather    Cancer Cousin        maternal cousin with unknown form of cancer    Past Surgical History:  Procedure Laterality Date   ABDOMINAL HYSTERECTOMY     APPENDECTOMY     BREAST LUMPECTOMY WITH NEEDLE LOCALIZATION Left 10/09/2012   Procedure: BREAST LUMPECTOMY WITH NEEDLE LOCALIZATION;  Surgeon: Deward GORMAN Curvin DOUGLAS, MD;  Location: [facility];  Service: General;  Laterality: Left;  needle localization at SOLIS 7:30    COLONOSCOPY     fissures     INTRAMEDULLARY (IM) NAIL INTERTROCHANTERIC Left 10/19/2018   Procedure: INTRAMEDULLARY (IM) NAIL INTERTROCHANTRIC;  Surgeon: Jerri Kay HERO, MD;  Location: [facility] OR;  Service: Orthopedics;  Laterality: Left;   MOUTH SURGERY     Social History   Occupational History   Occupation: Retired theme park manager  Tobacco Use   Smoking status: Never   Smokeless tobacco: Never  Substance and Sexual Activity   Alcohol  use: No   Drug use: No   Sexual activity: Not Currently        "

## 2024-04-29 ENCOUNTER — Emergency Department (HOSPITAL_COMMUNITY)

## 2024-04-29 ENCOUNTER — Observation Stay (HOSPITAL_COMMUNITY)
Admission: EM | Admit: 2024-04-29 | Discharge: 2024-05-08 | Disposition: A | Discharge status: Skilled Nursing Facility | Attending: Hospitalist | Admitting: Hospitalist

## 2024-04-29 DIAGNOSIS — E876 Hypokalemia: Secondary | ICD-10-CM | POA: Diagnosis not present

## 2024-04-29 DIAGNOSIS — Z170 Estrogen receptor positive status [ER+]: Secondary | ICD-10-CM | POA: Diagnosis not present

## 2024-04-29 DIAGNOSIS — M1990 Unspecified osteoarthritis, unspecified site: Secondary | ICD-10-CM | POA: Insufficient documentation

## 2024-04-29 DIAGNOSIS — N390 Urinary tract infection, site not specified: Secondary | ICD-10-CM | POA: Diagnosis not present

## 2024-04-29 DIAGNOSIS — M25512 Pain in left shoulder: Secondary | ICD-10-CM | POA: Insufficient documentation

## 2024-04-29 DIAGNOSIS — Z7982 Long term (current) use of aspirin: Secondary | ICD-10-CM | POA: Insufficient documentation

## 2024-04-29 DIAGNOSIS — R531 Weakness: Secondary | ICD-10-CM | POA: Diagnosis present

## 2024-04-29 DIAGNOSIS — M79605 Pain in left leg: Secondary | ICD-10-CM | POA: Diagnosis not present

## 2024-04-29 DIAGNOSIS — G8929 Other chronic pain: Secondary | ICD-10-CM | POA: Insufficient documentation

## 2024-04-29 DIAGNOSIS — J320 Chronic maxillary sinusitis: Secondary | ICD-10-CM | POA: Insufficient documentation

## 2024-04-29 DIAGNOSIS — R7989 Other specified abnormal findings of blood chemistry: Secondary | ICD-10-CM | POA: Diagnosis not present

## 2024-04-29 DIAGNOSIS — I10 Essential (primary) hypertension: Secondary | ICD-10-CM | POA: Diagnosis not present

## 2024-04-29 DIAGNOSIS — E440 Moderate protein-calorie malnutrition: Secondary | ICD-10-CM | POA: Insufficient documentation

## 2024-04-29 DIAGNOSIS — Z79899 Other long term (current) drug therapy: Secondary | ICD-10-CM | POA: Insufficient documentation

## 2024-04-29 DIAGNOSIS — Z853 Personal history of malignant neoplasm of breast: Secondary | ICD-10-CM | POA: Insufficient documentation

## 2024-04-29 DIAGNOSIS — E785 Hyperlipidemia, unspecified: Secondary | ICD-10-CM | POA: Diagnosis present

## 2024-04-29 DIAGNOSIS — M25519 Pain in unspecified shoulder: Secondary | ICD-10-CM | POA: Insufficient documentation

## 2024-04-29 DIAGNOSIS — D649 Anemia, unspecified: Secondary | ICD-10-CM | POA: Diagnosis not present

## 2024-04-29 DIAGNOSIS — C50412 Malignant neoplasm of upper-outer quadrant of left female breast: Secondary | ICD-10-CM | POA: Diagnosis not present

## 2024-04-29 DIAGNOSIS — E039 Hypothyroidism, unspecified: Secondary | ICD-10-CM | POA: Diagnosis not present

## 2024-04-29 DIAGNOSIS — N3001 Acute cystitis with hematuria: Secondary | ICD-10-CM

## 2024-04-29 LAB — CBC WITH DIFFERENTIAL/PLATELET
Abs Immature Granulocytes: 0.06 K/uL (ref 0.00–0.07)
Basophils Absolute: 0 K/uL (ref 0.0–0.1)
Basophils Relative: 0 %
Eosinophils Absolute: 0 K/uL (ref 0.0–0.5)
Eosinophils Relative: 0 %
HCT: 42 % (ref 36.0–46.0)
Hemoglobin: 14.2 g/dL (ref 12.0–15.0)
Immature Granulocytes: 0 %
Lymphocytes Relative: 7 %
Lymphs Abs: 1.1 K/uL (ref 0.7–4.0)
MCH: 30.9 pg (ref 26.0–34.0)
MCHC: 33.8 g/dL (ref 30.0–36.0)
MCV: 91.5 fL (ref 80.0–100.0)
Monocytes Absolute: 1.3 K/uL — ABNORMAL HIGH (ref 0.1–1.0)
Monocytes Relative: 9 %
Neutro Abs: 12.1 K/uL — ABNORMAL HIGH (ref 1.7–7.7)
Neutrophils Relative %: 84 %
Platelets: 239 K/uL (ref 150–400)
RBC: 4.59 MIL/uL (ref 3.87–5.11)
RDW: 12.3 % (ref 11.5–15.5)
WBC: 14.6 K/uL — ABNORMAL HIGH (ref 4.0–10.5)
nRBC: 0 % (ref 0.0–0.2)

## 2024-04-29 LAB — URINALYSIS, ROUTINE W REFLEX MICROSCOPIC
Bilirubin Urine: NEGATIVE
Glucose, UA: NEGATIVE mg/dL
Ketones, ur: 5 mg/dL — AB
Nitrite: NEGATIVE
Protein, ur: 30 mg/dL — AB
Specific Gravity, Urine: 1.005 (ref 1.005–1.030)
pH: 6 (ref 5.0–8.0)

## 2024-04-29 LAB — RESP PANEL BY RT-PCR (RSV, FLU A&B, COVID)  RVPGX2
Influenza A by PCR: NEGATIVE
Influenza B by PCR: NEGATIVE
Resp Syncytial Virus by PCR: NEGATIVE
SARS Coronavirus 2 by RT PCR: NEGATIVE

## 2024-04-29 LAB — COMPREHENSIVE METABOLIC PANEL WITH GFR
ALT: 22 U/L (ref 0–44)
AST: 34 U/L (ref 15–41)
Albumin: 4.5 g/dL (ref 3.5–5.0)
Alkaline Phosphatase: 109 U/L (ref 38–126)
Anion gap: 15 (ref 5–15)
BUN: 15 mg/dL (ref 8–23)
CO2: 26 mmol/L (ref 22–32)
Calcium: 9.7 mg/dL (ref 8.9–10.3)
Chloride: 94 mmol/L — ABNORMAL LOW (ref 98–111)
Creatinine, Ser: 0.6 mg/dL (ref 0.44–1.00)
GFR, Estimated: >60 mL/min
Glucose, Bld: 106 mg/dL — ABNORMAL HIGH (ref 70–99)
Potassium: 2.9 mmol/L — ABNORMAL LOW (ref 3.5–5.1)
Sodium: 135 mmol/L (ref 135–145)
Total Bilirubin: 1 mg/dL (ref 0.0–1.2)
Total Protein: 7.5 g/dL (ref 6.5–8.1)

## 2024-04-29 LAB — MAGNESIUM: Magnesium: 2.2 mg/dL (ref 1.7–2.4)

## 2024-04-29 LAB — TROPONIN T, HIGH SENSITIVITY
Troponin T High Sensitivity: 27 ng/L — ABNORMAL HIGH (ref 0–19)
Troponin T High Sensitivity: 30 ng/L — ABNORMAL HIGH (ref 0–19)

## 2024-04-29 LAB — CBG MONITORING, ED: Glucose-Capillary: 95 mg/dL (ref 70–99)

## 2024-04-29 MED ORDER — POTASSIUM CHLORIDE CRYS ER 20 MEQ PO TBCR
20.0000 meq | EXTENDED_RELEASE_TABLET | Freq: Once | ORAL | Status: AC
  Administered 2024-04-29: 20 meq via ORAL
  Filled 2024-04-29: qty 1

## 2024-04-29 MED ORDER — SODIUM CHLORIDE 0.9 % IV SOLN
1.0000 g | Freq: Once | INTRAVENOUS | Status: AC
  Administered 2024-04-29: 1 g via INTRAVENOUS
  Filled 2024-04-29: qty 10

## 2024-04-29 MED ORDER — SODIUM CHLORIDE 0.9 % IV BOLUS
500.0000 mL | Freq: Once | INTRAVENOUS | Status: AC
  Administered 2024-04-29: 500 mL via INTRAVENOUS

## 2024-04-29 NOTE — ED Provider Notes (Signed)
 " [place] EMERGENCY DEPARTMENT AT [facility] Provider Note   CSN: [id number] Arrival date & time: 04/29/24  1807     Patient presents with: Weakness   Becky Montoya is a 89 y.o. female.   Patient to ED from [facility] where she lives in independent living for evaluation of progressively worsening generalized weakness. She denies chest pain, SOB, cough or fever. No nausea, vomiting or diarrhea. She reports this morning she was too weak to get herself to the bathroom and was incontinent of urine. Per daughter, she had nursing come in to help who reported some hematuria. No AMS or confusion.  The history is provided by the patient and a relative. No language interpreter was used.  Weakness      Prior to Admission medications  Medication Sig Start Date End Date Taking? Authorizing Provider  acetaminophen  (TYLENOL ) 500 MG tablet Take 1 tablet (500 mg total) by mouth every 6 (six) hours as needed for mild pain (pain score 1-3), fever or headache. 03/11/24   Jadine Toribio SQUIBB, MD  aspirin  EC 81 MG tablet Take 81 mg by mouth every morning.    [provider]  carvedilol  (COREG ) 12.5 MG tablet Take 12.5 mg by mouth 2 (two) times daily with a meal.    [provider]  Coenzyme Q10 (COQ10 PO) Take 1 Dose by mouth every evening.    [provider]  FIBER PO Take 1 Dose by mouth daily.    [provider]  levothyroxine  (SYNTHROID , LEVOTHROID) 50 MCG tablet Take 50 mcg by mouth every morning.    [provider]  lidocaine  (LIDODERM ) 5 % Place 1 patch onto the skin daily. Remove & Discard patch within 12 hours or as directed by MD 03/08/24   Neysa Caron PARAS, DO  LORazepam  (ATIVAN ) 0.5 MG tablet Take 0.5-1 tablets (0.25-0.5 mg total) by mouth daily as needed for anxiety or sleep. 03/11/24   Jadine Toribio SQUIBB, MD  mirtazapine  (REMERON ) 15 MG tablet Take 15 mg by mouth at bedtime. 12/25/23   [provider]  Misc Natural Products (LUTEIN 20)  CAPS Take 1 capsule by mouth 2 (two) times daily.    [provider]  Multiple Vitamin (MULTIVITAMIN PO) Take 1 Dose by mouth in the morning and at bedtime.    [provider]  Omega-3 Fatty Acids (OMEGA 3 PO) Take 1 capsule by mouth at bedtime.    [provider]  oxyCODONE  (OXY IR/ROXICODONE ) 5 MG immediate release tablet Take 1 tablet (5 mg total) by mouth every 8 (eight) hours as needed for moderate pain (pain score 4-6) or severe pain (pain score 7-10). 03/11/24   Jadine Toribio SQUIBB, MD  polyethylene glycol (MIRALAX  / GLYCOLAX ) 17 g packet Take 17 g by mouth daily as needed for mild constipation. 03/11/24   Jadine Toribio SQUIBB, MD  Probiotic Product (PROBIOTIC PO) Take 1 Dose by mouth daily.    [provider]  senna-docusate (SENOKOT-S) 8.6-50 MG tablet Take 2 tablets by mouth at bedtime as needed for mild constipation or moderate constipation. 10/21/18   Amin, Ankit C, MD  simvastatin  (ZOCOR ) 20 MG tablet Take 20 mg by mouth every evening.     [provider]  valsartan (DIOVAN) 320 MG tablet Take 320 mg by mouth every evening. 10/23/23   [provider]    Allergies: Wellbutrin [bupropion]    Review of Systems  Neurological:  Positive for weakness.    Updated Vital Signs BP ROLLEN)  179/78   Pulse 86   Temp 98 F (36.7 C) (Oral)   Resp 17   SpO2 93%   Physical Exam Vitals and nursing note reviewed.  Constitutional:      Appearance: She is not ill-appearing.  HENT:     Mouth/Throat:     Mouth: Mucous membranes are moist.  Cardiovascular:     Rate and Rhythm: Normal rate and regular rhythm.     Heart sounds: No murmur heard. Pulmonary:     Effort: Pulmonary effort is normal.     Breath sounds: No wheezing, rhonchi or rales.  Abdominal:     General: There is no distension.     Palpations: Abdomen is soft.     Tenderness: There is no abdominal tenderness.  Skin:    General: Skin is warm and dry.  Neurological:     Mental  Status: She is alert and oriented to person, place, and time.     (all labs ordered are listed, but only abnormal results are displayed) Labs Reviewed  CBC WITH DIFFERENTIAL/PLATELET - Abnormal; Notable for the following components:      Result Value   WBC 14.6 (*)    Neutro Abs 12.1 (*)    Monocytes Absolute 1.3 (*)    All other components within normal limits  COMPREHENSIVE METABOLIC PANEL WITH GFR - Abnormal; Notable for the following components:   Potassium 2.9 (*)    Chloride 94 (*)    Glucose, Bld 106 (*)    All other components within normal limits  URINALYSIS, ROUTINE W REFLEX MICROSCOPIC - Abnormal; Notable for the following components:   APPearance HAZY (*)    Hgb urine dipstick LARGE (*)    Ketones, ur 5 (*)    Protein, ur 30 (*)    Leukocytes,Ua SMALL (*)    Bacteria, UA MANY (*)    All other components within normal limits  TROPONIN T, HIGH SENSITIVITY - Abnormal; Notable for the following components:   Troponin T High Sensitivity 27 (*)    All other components within normal limits  TROPONIN T, HIGH SENSITIVITY - Abnormal; Notable for the following components:   Troponin T High Sensitivity 30 (*)    All other components within normal limits  RESP PANEL BY RT-PCR (RSV, FLU A&B, COVID)  RVPGX2  URINE CULTURE  MAGNESIUM   CBG MONITORING, ED  CBG MONITORING, ED    EKG: EKG Interpretation Date/Time:  Wednesday April 29 2024 18:21:29 EST Ventricular Rate:  91 PR Interval:  162 QRS Duration:  97 QT Interval:  368 QTC Calculation: 453 R Axis:   24  Text Interpretation: Sinus rhythm Anterior infarct, old Confirmed by Elnor Savant ([id number]) on 04/29/2024 6:24:59 PM  Radiology: DG Chest Portable 1 View Result Date: 04/29/2024 EXAM: 1 VIEW(S) XRAY OF THE CHEST 04/29/2024 06:35:00 PM COMPARISON: 03/08/2024 CLINICAL HISTORY: weak weak weak weak weak FINDINGS: LUNGS AND PLEURA: No focal pulmonary opacity. No pleural effusion. No pneumothorax. HEART AND MEDIASTINUM:  Aortic atherosclerosis. No acute abnormality of the cardiac and mediastinal silhouettes. BONES AND SOFT TISSUES: No acute osseous abnormality. IMPRESSION: 1. No acute findings. Electronically signed by: Greig Pique MD 04/29/2024 06:41 PM EST RP Workstation: [id number]     Procedures   Medications Ordered in the ED  sodium chloride  0.9 % bolus 500 mL (500 mLs Intravenous New Bag/Given 04/29/24 2307)  potassium chloride  SA (KLOR-CON  M) CR tablet 20 mEq (20 mEq Oral Given 04/29/24 2303)  cefTRIAXone  (ROCEPHIN ) 1 g in sodium chloride  0.9 % 100  mL IVPB (0 g Intravenous Stopped 04/29/24 2336)    Clinical Course as of 04/30/24 0005  Wed Apr 29, 2024  2227 Patient to ED with progressive generalized weakness x 2-3 days. Found to be hypokalemic 2.9, WBC 14.6. CXR clear. Suspect UTI. Will provide fluids, wait for UA.  [SU]  Thu Apr 30, 2024  0004 UA concerning for UTI. Rocephin  provided. Culture pending. Given her generalized weakness - still unable to get up on her own in the ED - low potassium, leukocytosis, will admit for further antibiotic treatment pending culture result, strengthening, potassium supplementation, prior to returning to independent living. Discussed with Dr. Shona, [facility], who accepts for admission. Daughter, Kate, contacted and updated on plan.  [SU]    Clinical Course User Index [SU] Odell Balls, PA-C                                 Medical Decision Making Amount and/or Complexity of Data Reviewed Labs: ordered.        Final diagnoses:  Weakness  Acute cystitis with hematuria  Hypokalemia    ED Discharge Orders     None          Odell Balls, PA-C 04/30/24 0006    Simon Lavonia SAILOR, MD 04/30/24 2000  "

## 2024-04-29 NOTE — ED Notes (Addendum)
 Becky Montoya

## 2024-04-29 NOTE — ED Triage Notes (Addendum)
 Patient BIBA coming from independent living, c/o weakness that started around 0800, not normally incontinent but had accident while unable to get up. Patient is alert and oriented x 4. Airway patent, respirations even and unlabored. Skin normal, warm and dry. BP 148/98 CBG 130. Patient denies headache/blurred vision/fever/chills/dysuria. No unilateral weakness/facial asymmetry noted.

## 2024-04-29 NOTE — ED Provider Notes (Incomplete)
 " [place] EMERGENCY DEPARTMENT AT [facility] Provider Note   CSN: [id number] Arrival date & time: 04/29/24  1807     Patient presents with: Weakness   Becky Montoya is a 89 y.o. female.  {Add pertinent medical, surgical, social history, OB history to YEP:67052} Patient to ED from [facility] where she lives in independent living for evaluation of progressively worsening generalized weakness. She denies chest pain, SOB, cough or fever. No nausea, vomiting or diarrhea. She reports this morning she was too weak to get herself to the bathroom and was incontinent of urine. Per daughter, she had nursing come in to help who reported some hematuria. No AMS or confusion.  The history is provided by the patient and a relative. No language interpreter was used.  Weakness      Prior to Admission medications  Medication Sig Start Date End Date Taking? Authorizing Provider  acetaminophen  (TYLENOL ) 500 MG tablet Take 1 tablet (500 mg total) by mouth every 6 (six) hours as needed for mild pain (pain score 1-3), fever or headache. 03/11/24   Jadine Toribio SQUIBB, MD  aspirin  EC 81 MG tablet Take 81 mg by mouth every morning.    [provider]  carvedilol  (COREG ) 12.5 MG tablet Take 12.5 mg by mouth 2 (two) times daily with a meal.    [provider]  Coenzyme Q10 (COQ10 PO) Take 1 Dose by mouth every evening.    [provider]  FIBER PO Take 1 Dose by mouth daily.    [provider]  levothyroxine  (SYNTHROID , LEVOTHROID) 50 MCG tablet Take 50 mcg by mouth every morning.    [provider]  lidocaine  (LIDODERM ) 5 % Place 1 patch onto the skin daily. Remove & Discard patch within 12 hours or as directed by MD 03/08/24   Neysa Caron PARAS, DO  LORazepam  (ATIVAN ) 0.5 MG tablet Take 0.5-1 tablets (0.25-0.5 mg total) by mouth daily as needed for anxiety or sleep. 03/11/24   Jadine Toribio SQUIBB, MD  mirtazapine  (REMERON ) 15 MG tablet Take 15 mg by mouth at  bedtime. 12/25/23   [provider]  Misc Natural Products (LUTEIN 20) CAPS Take 1 capsule by mouth 2 (two) times daily.    [provider]  Multiple Vitamin (MULTIVITAMIN PO) Take 1 Dose by mouth in the morning and at bedtime.    [provider]  Omega-3 Fatty Acids (OMEGA 3 PO) Take 1 capsule by mouth at bedtime.    [provider]  oxyCODONE  (OXY IR/ROXICODONE ) 5 MG immediate release tablet Take 1 tablet (5 mg total) by mouth every 8 (eight) hours as needed for moderate pain (pain score 4-6) or severe pain (pain score 7-10). 03/11/24   Jadine Toribio SQUIBB, MD  polyethylene glycol (MIRALAX  / GLYCOLAX ) 17 g packet Take 17 g by mouth daily as needed for mild constipation. 03/11/24   Jadine Toribio SQUIBB, MD  Probiotic Product (PROBIOTIC PO) Take 1 Dose by mouth daily.    [provider]  senna-docusate (SENOKOT-S) 8.6-50 MG tablet Take 2 tablets by mouth at bedtime as needed for mild constipation or moderate constipation. 10/21/18   Amin, Ankit C, MD  simvastatin  (ZOCOR ) 20 MG tablet Take 20 mg by mouth every evening.     [provider]  valsartan (DIOVAN) 320 MG tablet Take 320 mg by mouth every evening. 10/23/23   [provider]    Allergies: Wellbutrin [bupropion]    Review of Systems  Neurological:  Positive for  weakness.    Updated Vital Signs BP (!) 162/80 (BP Location: Right Arm)   Pulse 92   Temp 98.1 F (36.7 C) (Oral)   Resp 18   SpO2 97%   Physical Exam Vitals and nursing note reviewed.  Constitutional:      Appearance: She is not ill-appearing.  HENT:     Mouth/Throat:     Mouth: Mucous membranes are moist.  Cardiovascular:     Rate and Rhythm: Normal rate and regular rhythm.     Heart sounds: No murmur heard. Pulmonary:     Effort: Pulmonary effort is normal.     Breath sounds: No wheezing, rhonchi or rales.  Abdominal:     General: There is no distension.     Palpations: Abdomen is soft.     Tenderness:  There is no abdominal tenderness.  Skin:    General: Skin is warm and dry.  Neurological:     Mental Status: She is alert and oriented to person, place, and time.     (all labs ordered are listed, but only abnormal results are displayed) Labs Reviewed  CBC WITH DIFFERENTIAL/PLATELET - Abnormal; Notable for the following components:      Result Value   WBC 14.6 (*)    Neutro Abs 12.1 (*)    Monocytes Absolute 1.3 (*)    All other components within normal limits  COMPREHENSIVE METABOLIC PANEL WITH GFR - Abnormal; Notable for the following components:   Potassium 2.9 (*)    Chloride 94 (*)    Glucose, Bld 106 (*)    All other components within normal limits  TROPONIN T, HIGH SENSITIVITY - Abnormal; Notable for the following components:   Troponin T High Sensitivity 27 (*)    All other components within normal limits  TROPONIN T, HIGH SENSITIVITY - Abnormal; Notable for the following components:   Troponin T High Sensitivity 30 (*)    All other components within normal limits  RESP PANEL BY RT-PCR (RSV, FLU A&B, COVID)  RVPGX2  URINE CULTURE  MAGNESIUM   URINALYSIS, ROUTINE W REFLEX MICROSCOPIC  CBG MONITORING, ED  CBG MONITORING, ED    EKG: EKG Interpretation Date/Time:  Wednesday April 29 2024 18:21:29 EST Ventricular Rate:  91 PR Interval:  162 QRS Duration:  97 QT Interval:  368 QTC Calculation: 453 R Axis:   24  Text Interpretation: Sinus rhythm Anterior infarct, old Confirmed by Elnor Savant ([id number]) on 04/29/2024 6:24:59 PM  Radiology: DG Chest Portable 1 View Result Date: 04/29/2024 EXAM: 1 VIEW(S) XRAY OF THE CHEST 04/29/2024 06:35:00 PM COMPARISON: 03/08/2024 CLINICAL HISTORY: weak weak weak weak weak FINDINGS: LUNGS AND PLEURA: No focal pulmonary opacity. No pleural effusion. No pneumothorax. HEART AND MEDIASTINUM: Aortic atherosclerosis. No acute abnormality of the cardiac and mediastinal silhouettes. BONES AND SOFT TISSUES: No acute osseous abnormality.  IMPRESSION: 1. No acute findings. Electronically signed by: Greig Pique MD 04/29/2024 06:41 PM EST RP Workstation: [id number]    {Document cardiac monitor, telemetry assessment procedure when appropriate:32947} Procedures   Medications Ordered in the ED - No data to display    {Click here for ABCD2, HEART and other calculators REFRESH Note before signing:1}                              Medical Decision Making Amount and/or Complexity of Data Reviewed Labs: ordered.   ***  {Document critical care time when appropriate  Document review of labs and clinical decision  tools ie CHADS2VASC2, etc  Document your independent review of radiology images and any outside records  Document your discussion with family members, caretakers and with consultants  Document social determinants of health affecting pt's care  Document your decision making why or why not admission, treatments were needed:32947:::1}   Final diagnoses:  None    ED Discharge Orders     None        "

## 2024-04-30 ENCOUNTER — Other Ambulatory Visit: Payer: Self-pay

## 2024-04-30 DIAGNOSIS — R531 Weakness: Secondary | ICD-10-CM | POA: Diagnosis not present

## 2024-04-30 LAB — CBC
HCT: 34.1 % — ABNORMAL LOW (ref 36.0–46.0)
HCT: 35.2 % — ABNORMAL LOW (ref 36.0–46.0)
Hemoglobin: 12 g/dL (ref 12.0–15.0)
Hemoglobin: 11.7 g/dL — ABNORMAL LOW (ref 12.0–15.0)
MCH: 32.3 pg (ref 26.0–34.0)
MCH: 30.8 pg (ref 26.0–34.0)
MCHC: 35.2 g/dL (ref 30.0–36.0)
MCHC: 33.2 g/dL (ref 30.0–36.0)
MCV: 91.9 fL (ref 80.0–100.0)
MCV: 92.6 fL (ref 80.0–100.0)
Platelets: 216 K/uL (ref 150–400)
Platelets: 199 K/uL (ref 150–400)
RBC: 3.71 MIL/uL — ABNORMAL LOW (ref 3.87–5.11)
RBC: 3.8 MIL/uL — ABNORMAL LOW (ref 3.87–5.11)
RDW: 12.6 % (ref 11.5–15.5)
RDW: 12.5 % (ref 11.5–15.5)
WBC: 9.6 K/uL (ref 4.0–10.5)
WBC: 8.2 K/uL (ref 4.0–10.5)
nRBC: 0 % (ref 0.0–0.2)
nRBC: 0 % (ref 0.0–0.2)

## 2024-04-30 LAB — BASIC METABOLIC PANEL WITH GFR
Anion gap: 11 (ref 5–15)
BUN: 15 mg/dL (ref 8–23)
CO2: 25 mmol/L (ref 22–32)
Calcium: 8.6 mg/dL — ABNORMAL LOW (ref 8.9–10.3)
Chloride: 102 mmol/L (ref 98–111)
Creatinine, Ser: 0.5 mg/dL (ref 0.44–1.00)
GFR, Estimated: >60 mL/min
Glucose, Bld: 88 mg/dL (ref 70–99)
Potassium: 3.7 mmol/L (ref 3.5–5.1)
Sodium: 137 mmol/L (ref 135–145)

## 2024-04-30 LAB — CREATININE, SERUM
Creatinine, Ser: 0.64 mg/dL (ref 0.44–1.00)
GFR, Estimated: >60 mL/min

## 2024-04-30 LAB — MAGNESIUM: Magnesium: 2.5 mg/dL — ABNORMAL HIGH (ref 1.7–2.4)

## 2024-04-30 LAB — PHOSPHORUS: Phosphorus: 2.8 mg/dL (ref 2.5–4.6)

## 2024-04-30 MED ORDER — POLYETHYLENE GLYCOL 3350 17 G PO PACK: 17.0000 g | PACK | Freq: Every day | ORAL | Status: DC | PRN

## 2024-04-30 MED ORDER — DICLOFENAC SODIUM 1 % EX GEL
4.0000 g | Freq: Four times a day (QID) | CUTANEOUS | Status: AC
  Administered 2024-04-30 – 2024-05-02 (×8): 4 g via TOPICAL
  Filled 2024-04-30 (×2): qty 100

## 2024-04-30 MED ORDER — CARVEDILOL 12.5 MG PO TABS: 12.5000 mg | ORAL_TABLET | Freq: Two times a day (BID) | ORAL | Status: DC

## 2024-04-30 MED ORDER — MIRTAZAPINE 15 MG PO TABS
15.0000 mg | ORAL_TABLET | Freq: Every day | ORAL | Status: DC
  Administered 2024-05-01 – 2024-05-07 (×8): 15 mg via ORAL
  Filled 2024-04-30 (×8): qty 1

## 2024-04-30 MED ORDER — LORAZEPAM 0.5 MG PO TABS
0.2500 mg | ORAL_TABLET | Freq: Every day | ORAL | Status: DC | PRN
  Administered 2024-05-01 – 2024-05-08 (×6): 0.5 mg via ORAL
  Filled 2024-04-30 (×6): qty 1

## 2024-04-30 MED ORDER — ENOXAPARIN SODIUM 40 MG/0.4ML IJ SOSY
40.0000 mg | PREFILLED_SYRINGE | INTRAMUSCULAR | Status: DC
  Administered 2024-04-30 – 2024-05-08 (×9): 40 mg via SUBCUTANEOUS
  Filled 2024-04-30 (×9): qty 0.4

## 2024-04-30 MED ORDER — ACETAMINOPHEN 325 MG PO TABS
650.0000 mg | ORAL_TABLET | Freq: Four times a day (QID) | ORAL | Status: AC | PRN
  Administered 2024-05-03 – 2024-05-06 (×4): 650 mg via ORAL
  Filled 2024-04-30 (×4): qty 2

## 2024-04-30 MED ORDER — SIMVASTATIN 20 MG PO TABS: 20.0000 mg | ORAL_TABLET | Freq: Every evening | ORAL | Status: DC

## 2024-04-30 MED ORDER — POTASSIUM CHLORIDE IN NACL 40-0.9 MEQ/L-% IV SOLN
INTRAVENOUS | Status: AC
  Filled 2024-04-30 (×3): qty 1000

## 2024-04-30 MED ORDER — MELATONIN 5 MG PO TABS
5.0000 mg | ORAL_TABLET | Freq: Every evening | ORAL | Status: DC | PRN
  Administered 2024-05-03 – 2024-05-06 (×4): 5 mg via ORAL
  Filled 2024-04-30 (×4): qty 1

## 2024-04-30 MED ORDER — SODIUM CHLORIDE 0.9 % IV SOLN
1.0000 g | INTRAVENOUS | Status: DC
  Administered 2024-04-30 – 2024-05-02 (×3): 1 g via INTRAVENOUS
  Filled 2024-04-30 (×3): qty 10

## 2024-04-30 MED ORDER — POTASSIUM CHLORIDE CRYS ER 20 MEQ PO TBCR
20.0000 meq | EXTENDED_RELEASE_TABLET | Freq: Three times a day (TID) | ORAL | Status: AC
  Administered 2024-04-30 (×3): 20 meq via ORAL
  Filled 2024-04-30 (×3): qty 1

## 2024-04-30 MED ORDER — LEVOTHYROXINE SODIUM 50 MCG PO TABS
50.0000 ug | ORAL_TABLET | Freq: Every day | ORAL | Status: DC
  Administered 2024-04-30 – 2024-05-08 (×9): 50 ug via ORAL
  Filled 2024-04-30 (×9): qty 1

## 2024-04-30 MED ORDER — PROCHLORPERAZINE EDISYLATE 10 MG/2ML IJ SOLN: 5.0000 mg | Freq: Four times a day (QID) | INTRAMUSCULAR | Status: DC | PRN

## 2024-04-30 NOTE — Care Management Obs Status (Signed)
 MEDICARE OBSERVATION STATUS NOTIFICATION   Patient Details  Name: Becky Montoya MRN: [id number] Date of Birth: 1927-02-18   Medicare Observation Status Notification Given:  Yes    Camelia JONETTA Cary, RN 04/30/2024, 1:37 PM

## 2024-04-30 NOTE — Progress Notes (Signed)
" °  Progress Note   [name] FMW:[id number] DOB: [birth year] DOA: 04/29/2024     1 DOS: the patient was seen and examined on 04/30/2024    Brief hospital course: Becky Montoya is a 89 y.o. female with medical history significant for arthritis, ambulatory dysfunction uses a walker, hypertension, hyperlipidemia, hypothyroidism, who presented to the ER due to generalized weakness, worsening for the past few days.  ED workup revealed UA suggestive of UTI.  RVP was negative.  Patient started on antibiotics.  Assessment and Plan:  Generalized weakness Likely due to UTI. - Will treat UTI. - PT/OT evaluation -Fall precaution. -Encourage increase in oral protein calorie intake   Presumed UTI, POA UA positive for pyuria -Follow urine culture -Continue Rocephin  started in the ER.   Hypokalemia in the setting of poor oral intake Serum potassium 2.9 on presentation Repleted orally and intravenously Magnesium  normal. - Continue to replete as needed.   Elevated troponin, suspect demand ischemia No reported anginal symptoms. No evidence of acute ischemia on twelve-lead EKG High-sensitivity troponin flat 27, 30. -Continue to monitor on telemetry.   Hypothyroidism -Continue home levothyroxine .   Arthritis Chronic shoulders pain -Voltaren  gel for muscular and joint pain.   Moderate protein calorie malnutrition Moderate muscle mass loss -Encourage increase oral protein calorie intake         Subjective: Patient states she is feeling a little better but feels unable to walk.  No lower extremity weakness noted on bedside evaluation.  Physical Exam: BP (!) 179/73 (BP Location: Left Arm)   Pulse 80   Temp 98.2 F (36.8 C) (Oral)   Resp 18   SpO2 99%    General: Alert, oriented X3  Eyes: Pupils equal, reactive  Oral cavity: moist mucous membranes  Head: Atraumatic, normocephalic  Neck: supple  Chest: clear to auscultation. No crackles, no wheezes  CVS: S1,S2  RRR. No murmurs  Abd: No distention, soft, non-tender. No masses palpable  Extr: No edema   MSK: Hand deformities consistent with arthritis Neurological: Grossly intact.    Data Reviewed:    Latest Ref Rng & Units 04/30/2024    5:30 AM 04/30/2024    2:10 AM 04/29/2024    6:29 PM  CBC  WBC 4.0 - 10.5 K/uL 8.2  9.6  14.6   Hemoglobin 12.0 - 15.0 g/dL 88.2  87.9  85.7   Hematocrit 36.0 - 46.0 % 35.2  34.1  42.0   Platelets 150 - 400 K/uL 199  216  239       Latest Ref Rng & Units 04/30/2024    5:30 AM 04/30/2024    2:10 AM 04/29/2024    6:29 PM  BMP  Glucose 70 - 99 mg/dL 88   893   BUN 8 - 23 mg/dL 15   15   Creatinine 9.55 - 1.00 mg/dL 9.49  9.35  9.39   Sodium 135 - 145 mmol/L 137   135   Potassium 3.5 - 5.1 mmol/L 3.7   2.9   Chloride 98 - 111 mmol/L 102   94   CO2 22 - 32 mmol/L 25   26   Calcium  8.9 - 10.3 mg/dL 8.6   9.7      Family Communication: n/a  Disposition: Status is: Observation DVT PPx: SQ Lovenox       Author: MDALA-GAUSI, Chudney Scheffler [name], MD 04/30/2024 3:12 PM  For on call review [email or web address].    "

## 2024-04-30 NOTE — Evaluation (Signed)
 Physical Therapy Evaluation Patient Details Name: Becky Montoya MRN: [id number] DOB: 02/23/1927 Today's Date: 04/30/2024  History of Present Illness  Pt is a 89 y.o. female presenting to [facility]  04/29/24 with weakness, pyuria.PMH: fractures of the left transverse process of L1, L2, L3 and L5, arthritis, breast cancer, HLD, HTN, osteoporosis  Clinical Impression  Pt admitted with above diagnosis.  Pt currently with functional limitations due to the deficits listed below (see PT Problem List). Pt will benefit from acute skilled PT to increase their independence and safety with mobility to allow discharge.     The patient required extensive assistance to mobilize, required  STEDY Lift to  transfer safely to and from  [facility]( due to stretcher height and  weakness).  Patient ambulatory with rollator at baseline in ILF. Patient dresses self, niece assists with shower.   Patient will benefit from continued inpatient follow up therapy, <3 hours/day.      If plan is discharge home, recommend the following: Two people to help with walking and/or transfers;A lot of help with bathing/dressing/bathroom   Can travel by private vehicle   No    Equipment Recommendations None recommended by PT  Recommendations for Other Services       Functional Status Assessment Patient has had a recent decline in their functional status and demonstrates the ability to make significant improvements in function in a reasonable and predictable amount of time.     Precautions / Restrictions Precautions Precautions: Fall Precaution/Restrictions Comments: L shoulder  pain, Restrictions Weight Bearing Restrictions Per Provider Order: No      Mobility  Bed Mobility Overal bed mobility: Needs Assistance Bed Mobility: Rolling, Sidelying to Sit, Sit to Supine, Sit to Sidelying Rolling: Mod assist Sidelying to sit: Max assist     Sit to sidelying: Max assist General bed mobility comments: performed x 2 due to not  being able to safely stand  , mod/max assist with trunk and  legs    Transfers Overall transfer level: Needs assistance   Transfers: Sit to/from Stand, Bed to chair/wheelchair/BSC Sit to Stand: Max assist           General transfer comment: attempted to stand and pivot to [facility] to the left without success, then attempted to the Right, patient not able to stand and pivot safely. ASsisted back to supine , STEDY brought up. Patient able to  reach for horizontal bar and stand, transfer to [facility] via stedy. Stood x 2 more times at [facility], transfer back to  Gaffer: Stedy  Ambulation/Gait                  Stairs            Wheelchair Mobility     Tilt Bed    Modified Rankin (Stroke Patients Only)       Balance Overall balance assessment: Needs assistance, History of Falls Sitting-balance support: Feet supported, Single extremity supported Sitting balance-Leahy Scale: Poor   Postural control: Posterior lean Standing balance support: Bilateral upper extremity supported, During functional activity, Reliant on assistive device for balance Standing balance-Leahy Scale: Poor                               Pertinent Vitals/Pain Pain Assessment Pain Assessment: Faces Faces Pain Scale: Hurts whole lot Pain Location: left shoulder Pain Descriptors / Indicators: Aching, Discomfort, Guarding Pain Intervention(s): Monitored during session, Limited activity within patient's  tolerance    Home Living Family/patient expects to be discharged to:: Private residence Living Arrangements: Alone Available Help at Discharge: Available PRN/intermittently;Family Type of Home: Independent living facility Home Access: Level entry       Home Layout: One level Home Equipment: Grab bars - toilet;Grab bars - tub/shower;Shower seat;Hand held shower head;Wheelchair - Physiological Scientist (4 wheels)      Prior Function Prior Level of Function :  Independent/Modified Independent;History of Falls (last six months)             Mobility Comments: uses 4 wheeled walker; walking to dining hall at ILF prior to fall ADLs Comments: independent with dressing, has niece assist with bathing, can cook simple meals in apartment. family assists with transportation.     Extremity/Trunk Assessment   Upper Extremity Assessment Upper Extremity Assessment: Defer to OT evaluation;Right hand dominant;LUE deficits/detail LUE Deficits / Details: decreased  AROM, pain with any movement, decreased use of the UE for functional activity but did r4each forward to STEDY bar    Lower Extremity Assessment Lower Extremity Assessment: Generalized weakness (noted edema of both  lower legs)    Cervical / Trunk Assessment Cervical / Trunk Assessment: Normal  Communication   Communication Communication: No apparent difficulties    Cognition Arousal: Alert Behavior During Therapy: WFL for tasks assessed/performed   PT - Cognitive impairments: No family/caregiver present to determine baseline, Difficult to assess, Problem solving                       PT - Cognition Comments: generally follows directions, some delay Following commands: Impaired Following commands impaired: Follows one step commands with increased time     Cueing Cueing Techniques: Verbal cues     General Comments      Exercises     Assessment/Plan    PT Assessment Patient needs continued PT services  PT Problem List Decreased strength;Decreased range of motion;Decreased activity tolerance;Decreased balance;Decreased mobility;Decreased knowledge of use of DME;Pain       PT Treatment Interventions DME instruction;Gait training;Functional mobility training;Therapeutic activities;Therapeutic exercise;Patient/family education    PT Goals (Current goals can be found in the Care Plan section)  Acute Rehab PT Goals PT Goal Formulation: With patient Time For Goal  Achievement: 05/14/24 Potential to Achieve Goals: Fair    Frequency Min 2X/week     Co-evaluation               AM-PAC PT 6 Clicks Mobility  Outcome Measure Help needed turning from your back to your side while in a flat bed without using bedrails?: A Lot Help needed moving from lying on your back to sitting on the side of a flat bed without using bedrails?: A Lot Help needed moving to and from a bed to a chair (including a wheelchair)?: Total Help needed standing up from a chair using your arms (e.g., wheelchair or bedside chair)?: Total Help needed to walk in hospital room?: Total Help needed climbing 3-5 steps with a railing? : Total 6 Click Score: 8    End of Session Equipment Utilized During Treatment: Gait belt Activity Tolerance: Patient limited by fatigue Patient left: in bed;with nursing/sitter in room Nurse Communication: Mobility status;Need for lift equipment PT Visit Diagnosis: Unsteadiness on feet (R26.81);History of falling (Z91.81);Difficulty in walking, not elsewhere classified (R26.2)    Time: 9072-8992 PT Time Calculation (min) (ACUTE ONLY): 40 min   Charges:   PT Evaluation $PT Eval Low Complexity: 1 Low PT Treatments $Therapeutic Activity: 8-22  mins $Self Care/Home Management: 8-22 PT General Charges $$ ACUTE PT VISIT: 1 Visit         Darice Potters PT Acute Rehabilitation Services Office [phone]   Potters Darice Norris 04/30/2024, 10:49 AM

## 2024-04-30 NOTE — Care Management CC44 (Signed)
"         Condition Code 44 Documentation Completed  Patient Details  Name: JEANNA GIUFFRE MRN: [id number] Date of Birth: January 07, 1927   Condition Code 44 given:  Yes Patient signature on Condition Code 44 notice:  Yes Documentation of 2 MD's agreement:  Yes Code 44 added to claim:  Yes    Camelia JONETTA Cary, RN 04/30/2024, 1:37 PM  "

## 2024-04-30 NOTE — Plan of Care (Signed)
   Problem: Education: Goal: Knowledge of General Education information will improve Description Including pain rating scale, medication(s)/side effects and non-pharmacologic comfort measures Outcome: Progressing

## 2024-04-30 NOTE — H&P (Addendum)
 " History and Physical  Becky Montoya FMW:[id number] DOB: 1926-12-11 DOA: 04/29/2024  Referring physician: Odell Balls, PA-EDP  PCP: Teresa Channel, MD  Outpatient Specialists: Orthopedic surgery, ENT, endocrinology. Patient coming from: Home independent living facility.  Chief Complaint: Generalized weakness.  HPI: Becky Montoya is a 89 y.o. female with medical history significant for arthritis, ambulatory dysfunction uses a walker, hypertension, hyperlipidemia, hypothyroidism, who presents to the ER due to generalized weakness, worsening for the past few days.  States since her fall back in March 08, 2024 she has had a lot of muscular and joint pain particularly in her shoulders and back.  She still tries to cook for herself and take care of her ADLs but has had some difficulties.  Endorses mild lower abdominal discomfort and constipation.  No reported subjective fevers or chills.  No reported anginal symptoms.  EMS was activated and the patient was brought into the ER for further evaluation.  In the ER, vital signs are stable.  Weak appearing.  UA positive for pyuria.  Due to concern for UTI, Rocephin  was initiated in the ER.  Complains of chronic shoulders pain with flare.  TRH, hospitalist service, was asked to admit.  ED Course: Temperature 97.8.  BP 148/61, pulse 84, respiration rate 17, O2 saturation 97% on room air.  Review of Systems: Review of systems as noted in the HPI. All other systems reviewed and are negative.   Past Medical History:  Diagnosis Date   Arthritis    Breast cancer (HCC)    Hyperlipidemia    Hypertension    Osteoporosis    Thyroid  disease    Past Surgical History:  Procedure Laterality Date   ABDOMINAL HYSTERECTOMY     APPENDECTOMY     BREAST LUMPECTOMY WITH NEEDLE LOCALIZATION Left 10/09/2012   Procedure: BREAST LUMPECTOMY WITH NEEDLE LOCALIZATION;  Surgeon: Deward GORMAN Curvin DOUGLAS, MD;  Location: [facility];  Service: General;   Laterality: Left;  needle localization at SOLIS 7:30    COLONOSCOPY     fissures     INTRAMEDULLARY (IM) NAIL INTERTROCHANTERIC Left 10/19/2018   Procedure: INTRAMEDULLARY (IM) NAIL INTERTROCHANTRIC;  Surgeon: Jerri Kay CHRISTELLA, MD;  Location: [facility] OR;  Service: Orthopedics;  Laterality: Left;   MOUTH SURGERY      Social History:  reports that she has never smoked. She has never used smokeless tobacco. She reports that she does not drink alcohol  and does not use drugs.   Allergies[1]  Family History  Problem Relation Age of Onset   Breast cancer Sister        dx in her 54s   Cancer Maternal Grandmother        unknown form of cancer   Heart attack Maternal Grandfather    Cancer Cousin        maternal cousin with unknown form of cancer      Prior to Admission medications  Medication Sig Start Date End Date Taking? Authorizing Provider  fluticasone (FLONASE) 50 MCG/ACT nasal spray Place 2 sprays into both nostrils 2 (two) times daily. 04/13/24  Yes [provider]  acetaminophen  (TYLENOL ) 500 MG tablet Take 1 tablet (500 mg total) by mouth every 6 (six) hours as needed for mild pain (pain score 1-3), fever or headache. 03/11/24   Jadine Toribio SQUIBB, MD  aspirin  EC 81 MG tablet Take 81 mg by mouth every morning.    [provider]  carvedilol  (COREG ) 12.5 MG tablet Take 12.5 mg by mouth 2 (two)  times daily with a meal.    [provider]  Coenzyme Q10 (COQ10 PO) Take 1 Dose by mouth every evening.    [provider]  FIBER PO Take 1 Dose by mouth daily.    [provider]  levothyroxine  (SYNTHROID , LEVOTHROID) 50 MCG tablet Take 50 mcg by mouth every morning.    [provider]  lidocaine  (LIDODERM ) 5 % Place 1 patch onto the skin daily. Remove & Discard patch within 12 hours or as directed by MD 03/08/24   Neysa Caron PARAS, DO  LORazepam  (ATIVAN ) 0.5 MG tablet Take 0.5-1 tablets (0.25-0.5 mg total) by mouth daily as needed for anxiety or  sleep. 03/11/24   Jadine Toribio SQUIBB, MD  mirtazapine  (REMERON ) 15 MG tablet Take 15 mg by mouth at bedtime. 12/25/23   [provider]  Misc Natural Products (LUTEIN 20) CAPS Take 1 capsule by mouth 2 (two) times daily.    [provider]  Multiple Vitamin (MULTIVITAMIN PO) Take 1 Dose by mouth in the morning and at bedtime.    [provider]  Omega-3 Fatty Acids (OMEGA 3 PO) Take 1 capsule by mouth at bedtime.    [provider]  oxyCODONE  (OXY IR/ROXICODONE ) 5 MG immediate release tablet Take 1 tablet (5 mg total) by mouth every 8 (eight) hours as needed for moderate pain (pain score 4-6) or severe pain (pain score 7-10). 03/11/24   Jadine Toribio SQUIBB, MD  polyethylene glycol (MIRALAX  / GLYCOLAX ) 17 g packet Take 17 g by mouth daily as needed for mild constipation. 03/11/24   Jadine Toribio SQUIBB, MD  Probiotic Product (PROBIOTIC PO) Take 1 Dose by mouth daily.    [provider]  senna-docusate (SENOKOT-S) 8.6-50 MG tablet Take 2 tablets by mouth at bedtime as needed for mild constipation or moderate constipation. 10/21/18   Amin, Ankit C, MD  simvastatin  (ZOCOR ) 20 MG tablet Take 20 mg by mouth every evening.     [provider]  valsartan (DIOVAN) 320 MG tablet Take 320 mg by mouth every evening. 10/23/23   [provider]    Physical Exam: BP (!) 179/78   Pulse 86   Temp 98 F (36.7 C) (Oral)   Resp 17   SpO2 93%   General: 89 y.o. year-old female frail appearing in no acute distress.  Alert and oriented x3. Cardiovascular: Regular rate and rhythm with no rubs or gallops.  No thyromegaly or JVD noted.  Trace lower extremity edema bilaterally.   Respiratory: Clear to auscultation with no wheezes or rales. Good inspiratory effort. Abdomen: Soft nontender nondistended with normal bowel sounds x4 quadrants. Muskuloskeletal: No cyanosis or clubbing noted bilaterally Neuro: CN II-XII intact, strength, sensation, reflexes Skin: No  ulcerative lesions noted or rashes Psychiatry: Judgement and insight appear normal. Mood is appropriate for condition and setting          Labs on Admission:  Basic Metabolic Panel: Recent Labs  Lab 04/29/24 1829  NA 135  K 2.9*  CL 94*  CO2 26  GLUCOSE 106*  BUN 15  CREATININE 0.60  CALCIUM  9.7  MG 2.2   Liver Function Tests: Recent Labs  Lab 04/29/24 1829  AST 34  ALT 22  ALKPHOS 109  BILITOT 1.0  PROT 7.5  ALBUMIN 4.5   No results for input(s): LIPASE, AMYLASE in the last 168 hours. No results for input(s): AMMONIA in the last 168 hours. CBC: Recent Labs  Lab 04/29/24 1829  WBC 14.6*  NEUTROABS  12.1*  HGB 14.2  HCT 42.0  MCV 91.5  PLT 239   Cardiac Enzymes: No results for input(s): CKTOTAL, CKMB, CKMBINDEX, TROPONINI in the last 168 hours.  BNP (last 3 results) No results for input(s): BNP in the last 8760 hours.  ProBNP (last 3 results) No results for input(s): PROBNP in the last 8760 hours.  CBG: Recent Labs  Lab 04/29/24 1815  GLUCAP 95    Radiological Exams on Admission: DG Chest Portable 1 View Result Date: 04/29/2024 EXAM: 1 VIEW(S) XRAY OF THE CHEST 04/29/2024 06:35:00 PM COMPARISON: 03/08/2024 CLINICAL HISTORY: weak weak weak weak weak FINDINGS: LUNGS AND PLEURA: No focal pulmonary opacity. No pleural effusion. No pneumothorax. HEART AND MEDIASTINUM: Aortic atherosclerosis. No acute abnormality of the cardiac and mediastinal silhouettes. BONES AND SOFT TISSUES: No acute osseous abnormality. IMPRESSION: 1. No acute findings. Electronically signed by: Greig Pique MD 04/29/2024 06:41 PM EST RP Workstation: [id number]    EKG: I independently viewed the EKG done and my findings are as followed: Normal sinus rhythm rate of 91.  QTc 453.  Assessment/Plan Present on Admission: **None**  Principal Problem:   Generalized weakness  Generalized weakness PT OT evaluation Fall precaution. Encourage increase in oral protein  calorie intake  Presumed UTI, POA UA positive for pyuria Follow urine culture Continue Rocephin  started in the ER.  Hypokalemia in the setting of poor oral intake Serum potassium 2.9 Repleted orally and intravenously Check magnesium  level. Repeat BMP in the morning  Elevated troponin, suspect demand ischemia No reported anginal symptoms. No evidence of acute ischemia on twelve-lead EKG High-sensitivity troponin flat 27, 30. Continue to monitor on telemetry.  Hypothyroidism Resume home levothyroxine .  Arthritis Chronic shoulders pain Voltaren  gel for muscular and joint pain.  Moderate protein calorie malnutrition Moderate muscle mass loss Encourage increase oral protein calorie intake   Time: 75 minutes.   DVT prophylaxis: Subcu Lovenox  daily.  Code Status: DNR/DNI.  Family Communication: None at bedside.  Disposition Plan: Admitted to telemetry unit.  Consults called: None.  Admission status: Inpatient status.   Status is: Inpatient The patient requires at least 2 midnight for further evaluation and treatment of present condition.   Terry LOISE Hurst MD [facility] Pager [phone]  If 7PM-7AM, please contact night-coverage [email or web address] Password [email or web address]  04/30/2024, 12:12 AM      [1]  Allergies Allergen Reactions   Wellbutrin [Bupropion] Other (See Comments)    Nervousness   "

## 2024-05-01 ENCOUNTER — Ambulatory Visit: Admitting: Sports Medicine

## 2024-05-01 ENCOUNTER — Observation Stay (HOSPITAL_COMMUNITY)

## 2024-05-01 DIAGNOSIS — R531 Weakness: Secondary | ICD-10-CM | POA: Diagnosis not present

## 2024-05-01 LAB — CBC
HCT: 32.2 % — ABNORMAL LOW (ref 36.0–46.0)
Hemoglobin: 11 g/dL — ABNORMAL LOW (ref 12.0–15.0)
MCH: 31.4 pg (ref 26.0–34.0)
MCHC: 34.2 g/dL (ref 30.0–36.0)
MCV: 92 fL (ref 80.0–100.0)
Platelets: 186 K/uL (ref 150–400)
RBC: 3.5 MIL/uL — ABNORMAL LOW (ref 3.87–5.11)
RDW: 12.9 % (ref 11.5–15.5)
WBC: 7.4 K/uL (ref 4.0–10.5)
nRBC: 0 % (ref 0.0–0.2)

## 2024-05-01 LAB — BASIC METABOLIC PANEL WITH GFR
Anion gap: 9 (ref 5–15)
BUN: 15 mg/dL (ref 8–23)
CO2: 24 mmol/L (ref 22–32)
Calcium: 8.7 mg/dL — ABNORMAL LOW (ref 8.9–10.3)
Chloride: 107 mmol/L (ref 98–111)
Creatinine, Ser: 0.54 mg/dL (ref 0.44–1.00)
GFR, Estimated: >60 mL/min
Glucose, Bld: 99 mg/dL (ref 70–99)
Potassium: 4.2 mmol/L (ref 3.5–5.1)
Sodium: 139 mmol/L (ref 135–145)

## 2024-05-01 MED ORDER — IOHEXOL 300 MG/ML  SOLN
75.0000 mL | Freq: Once | INTRAMUSCULAR | Status: AC | PRN
  Administered 2024-05-01: 75 mL via INTRAVENOUS

## 2024-05-01 MED ORDER — SIMVASTATIN 20 MG PO TABS
20.0000 mg | ORAL_TABLET | Freq: Every evening | ORAL | Status: DC
  Administered 2024-05-01 – 2024-05-07 (×8): 20 mg via ORAL
  Filled 2024-05-01 (×8): qty 1

## 2024-05-01 MED ORDER — CARVEDILOL 12.5 MG PO TABS
12.5000 mg | ORAL_TABLET | Freq: Two times a day (BID) | ORAL | Status: DC
  Administered 2024-05-01 – 2024-05-02 (×5): 12.5 mg via ORAL
  Filled 2024-05-01 (×5): qty 1

## 2024-05-01 MED ORDER — IRBESARTAN 150 MG PO TABS
300.0000 mg | ORAL_TABLET | Freq: Every day | ORAL | Status: DC
  Administered 2024-05-01 – 2024-05-08 (×8): 300 mg via ORAL
  Filled 2024-05-01 (×8): qty 2

## 2024-05-01 NOTE — Evaluation (Signed)
 Occupational Therapy Evaluation Patient Details Name: Becky Montoya MRN: [id number] DOB: Mar 22, 1927 Today's Date: 05/01/2024   History of Present Illness   Pt is a 89 y.o. female presenting to [facility] ED  04/29/24 with weakness, pyuria.PMH: fractures of the left transverse process of L1, L2, L3 and L5, arthritis, breast cancer, HLD, HTN, osteoporosis     Clinical Impressions Pt admitted with the above concerns. Pt currently with functional limitations due to the deficits listed below (see OT Problem List). Prior to admit, pt was living in ILF, completing ADL's at Mod I level. Has assistance for IADL's from family. Pt will benefit from acute skilled OT to increase their safety and independence with ADL and functional mobility for ADL to facilitate discharge. Patient will benefit from continued inpatient follow up therapy, <3 hours/day.       If plan is discharge home, recommend the following:   A little help with walking and/or transfers;A lot of help with bathing/dressing/bathroom     Functional Status Assessment   Patient has had a recent decline in their functional status and demonstrates the ability to make significant improvements in function in a reasonable and predictable amount of time.     Equipment Recommendations   None recommended by OT      Precautions/Restrictions   Precautions Precautions: Fall Recall of Precautions/Restrictions: Intact Precaution/Restrictions Comments: chronic shoulder pain (L<R) Restrictions Weight Bearing Restrictions Per Provider Order: No     Mobility Bed Mobility Overal bed mobility: Needs Assistance Bed Mobility: Supine to Sit     Supine to sit: Min assist, HOB elevated, Used rails     General bed mobility comments: required physical assist to bring trunk up from [facility].    Transfers Overall transfer level: Needs assistance Equipment used: Rolling walker (2 wheels) Transfers: Sit to/from Stand, Bed to  chair/wheelchair/BSC Sit to Stand: Min assist, From elevated surface           General transfer comment: VC for hand placement with RW management prior to sit to stand transition. VC for safety and to correct standing posture. Pt able to step pivot from bed to [facility] then to recliner with use of RW.      Balance Overall balance assessment: Needs assistance, History of Falls Sitting-balance support: Feet supported, Single extremity supported Sitting balance-Leahy Scale: Fair Sitting balance - Comments: sitting EOB Postural control: Posterior lean Standing balance support: Bilateral upper extremity supported, During functional activity, Reliant on assistive device for balance Standing balance-Leahy Scale: Poor        ADL either performed or assessed with clinical judgement   ADL Overall ADL's : Needs assistance/impaired Eating/Feeding: Set up;Sitting   Grooming: Set up;Sitting   Upper Body Bathing: Set up;Sitting   Lower Body Bathing: Total assistance;Sitting/lateral leans   Upper Body Dressing : Set up;Sitting   Lower Body Dressing: Total assistance;Sitting/lateral leans   Toilet Transfer: Minimal assistance;BSC/3in1;Stand-pivot   Toileting- Clothing Manipulation and Hygiene: Total assistance;Sit to/from stand       Vision Baseline Vision/History: 1 Wears glasses Ability to See in Adequate Light: 0 Adequate Patient Visual Report: No change from baseline Vision Assessment?: No apparent visual deficits     Perception Perception: Not tested       Praxis Praxis: Not tested       Pertinent Vitals/Pain Pain Assessment Pain Assessment: Faces Faces Pain Scale: Hurts a little bit Pain Location: left shoulder Pain Descriptors / Indicators: Grimacing Pain Intervention(s): Limited activity within patient's tolerance, Monitored during session  Extremity/Trunk Assessment Upper Extremity Assessment Upper Extremity Assessment: Right hand dominant;LUE  deficits/detail LUE Deficits / Details: reports bilateral chronic shoulder pain. Left is worse. Limited A/ROM. Able to demonstrate shoulder flexion/abduction to just under 90 degrees. Elbow and wrist ROM WFL.   Lower Extremity Assessment Lower Extremity Assessment: Defer to PT evaluation   Cervical / Trunk Assessment Cervical / Trunk Assessment: Normal   Communication Communication Communication: No apparent difficulties   Cognition Arousal: Alert Behavior During Therapy: WFL for tasks assessed/performed Cognition: No apparent impairments  Following commands: Intact Following commands impaired: Only follows one step commands consistently     Cueing  General Comments   Cueing Techniques: Verbal cues  VSS on RA           Home Living Family/patient expects to be discharged to:: Private residence Living Arrangements: Alone Available Help at Discharge: Available PRN/intermittently;Family (Neice assists with IADL's) Type of Home: Independent living facility Home Access: Level entry     Home Layout: One level     Bathroom Shower/Tub: Producer, Television/film/video: Handicapped height Bathroom Accessibility: Yes How Accessible: Accessible via walker Home Equipment: Grab bars - toilet;Grab bars - tub/shower;Shower seat;Hand held shower head;Wheelchair - Physiological Scientist (4 wheels)          Prior Functioning/Environment Prior Level of Function : Independent/Modified Independent;History of Falls (last six months)    Mobility Comments: uses Rolator; walking to dining hall at ILF prior to fall ADLs Comments: Niece assists with IADL's    OT Problem List: Impaired balance (sitting and/or standing);Decreased strength   OT Treatment/Interventions: Self-care/ADL training;Therapeutic exercise;Therapeutic activities;DME and/or AE instruction;Balance training;Patient/family education      OT Goals(Current goals can be found in the care plan section)   Acute Rehab OT  Goals Patient Stated Goal: to use the [facility] OT Goal Formulation: With patient Time For Goal Achievement: 05/15/24 Potential to Achieve Goals: Good   OT Frequency:  Min 2X/week       AM-PAC OT 6 Clicks Daily Activity     Outcome Measure Help from another person eating meals?: None Help from another person taking care of personal grooming?: A Little Help from another person toileting, which includes using toliet, bedpan, or urinal?: A Lot Help from another person bathing (including washing, rinsing, drying)?: A Lot Help from another person to put on and taking off regular upper body clothing?: A Little Help from another person to put on and taking off regular lower body clothing?: Total 6 Click Score: 15   End of Session Equipment Utilized During Treatment: Gait belt;Rolling walker (2 wheels) Nurse Communication: Mobility status  Activity Tolerance: Patient tolerated treatment well Patient left: in chair;with call bell/phone within reach;with chair alarm set;with family/visitor present  OT Visit Diagnosis: Muscle weakness (generalized) (M62.81)                Time: 8940-8857 OT Time Calculation (min): 43 min Charges:  OT General Charges $OT Visit: 1 Visit OT Evaluation $OT Eval Moderate Complexity: 1 Mod OT Treatments $Self Care/Home Management : 23-37 mins  Leita Howell, OTR/L,CBIS  Supplemental OT - [facility] and [facility] Secure Chat Preferred    Laruen Risser, Leita BIRCH 05/01/2024, 1:34 PM

## 2024-05-01 NOTE — Progress Notes (Addendum)
 " Progress Note   [name] FMW:[id number] DOB: 06-17-1926 DOA: 04/29/2024     1 DOS: the patient was seen and examined on 05/01/2024    Brief hospital course: Becky Montoya is a 89 y.o. female with medical history significant for arthritis, ambulatory dysfunction uses a walker, hypertension, hyperlipidemia, hypothyroidism, who presented to the ER due to generalized weakness, worsening for the past few days.  ED workup revealed UA suggestive of UTI.  RVP was negative.  Patient started on antibiotics.  Assessment and Plan:  Generalized weakness Likely due to UTI. Patient seen by PT and SNF recommended. - Will continue to treat UTI. - For placement after urine cultures speciated.   Presumed UTI, POA UA positive for pyuria Urine cultures growing Staph epidermidis within mixed culture -Continue to follow urine culture -Continue Rocephin  started in the ER.  Right nostril abnormality Patient with known history of chronic right maxillary sinusitis, and was seen by ENT in October 2025. - Given concern for mass, will CT face and sinuses.  Anemia Hemoglobin appears to have trended down from baseline of approximately 14. Patient denies melena. - Will continue to monitor. - Will continue to evaluate for sources of bleeding. - Will hold Lovenox  if hemoglobin continues to trend down.  Hypertension On Coreg , hydrochlorothiazide  and Valsartan at home.  - Continue home Coreg .  - Will resume home Valsartan. - Will hold hydrochlorothiazide  for now as it likely contributed to electrolyte disturbance (hypokalemia)  Hypokalemia Presented with potassium 2.9.  Likely related to hydrochlorothiazide . - Will replete as needed.    Elevated troponin, suspect demand ischemia No reported anginal symptoms. No evidence of acute ischemia on twelve-lead EKG High-sensitivity troponin flat 27, 30. -Continue to monitor on telemetry.   Hypothyroidism -Continue home levothyroxine .    Arthritis Chronic shoulders pain -Voltaren  gel for muscular and joint pain.   Moderate protein calorie malnutrition Moderate muscle mass loss -Encourage increase oral protein calorie intake         Subjective: Patient still complains of weakness.  She also complains of something in her right nostril which feels like a mass with scab.  Physical Exam: BP (!) 189/79 (BP Location: Right Arm)   Pulse 78   Temp 98.4 F (36.9 C) (Oral)   Resp 16   Ht 5' 2 (1.575 m)   SpO2 97%   BMI 21.58 kg/m    General: Alert, oriented X3  Eyes: Pupils equal, reactive  Oral cavity: moist mucous membranes  Head: Atraumatic, normocephalic  Neck: supple  Chest: clear to auscultation. No crackles, no wheezes  CVS: S1,S2 RRR. No murmurs  Abd: No distention, soft, non-tender. No masses palpable  Extr: No edema   MSK: Hand deformities consistent with arthritis Neurological: Grossly intact.    Data Reviewed:    Latest Ref Rng & Units 05/01/2024    6:15 AM 04/30/2024    5:30 AM 04/30/2024    2:10 AM  CBC  WBC 4.0 - 10.5 K/uL 7.4  8.2  9.6   Hemoglobin 12.0 - 15.0 g/dL 88.9  88.2  87.9   Hematocrit 36.0 - 46.0 % 32.2  35.2  34.1   Platelets 150 - 400 K/uL 186  199  216       Latest Ref Rng & Units 05/01/2024    6:15 AM 04/30/2024    5:30 AM 04/30/2024    2:10 AM  BMP  Glucose 70 - 99 mg/dL 99  88    BUN 8 - 23 mg/dL 15  15    Creatinine 0.44 - 1.00 mg/dL 9.45  9.49  9.35   Sodium 135 - 145 mmol/L 139  137    Potassium 3.5 - 5.1 mmol/L 4.2  3.7    Chloride 98 - 111 mmol/L 107  102    CO2 22 - 32 mmol/L 24  25    Calcium  8.9 - 10.3 mg/dL 8.7  8.6       Family Communication: n/a  Disposition: Status is: Observation DVT PPx: SQ Lovenox       Author: MDALA-GAUSI, Nielle Duford [name], MD 05/01/2024 1:22 PM  For on call review [email or web address]    "

## 2024-05-01 NOTE — Plan of Care (Signed)
  Problem: Education: Goal: Knowledge of General Education information will improve Description: Including pain rating scale, medication(s)/side effects and non-pharmacologic comfort measures Outcome: Progressing   Problem: Nutrition: Goal: Adequate nutrition will be maintained Outcome: Progressing   Problem: Coping: Goal: Level of anxiety will decrease Outcome: Progressing   Problem: Elimination: Goal: Will not experience complications related to bowel motility Outcome: Progressing Goal: Will not experience complications related to urinary retention Outcome: Progressing   Problem: Pain Managment: Goal: General experience of comfort will improve and/or be controlled Outcome: Progressing   Problem: Safety: Goal: Ability to remain free from injury will improve Outcome: Progressing   Problem: Skin Integrity: Goal: Risk for impaired skin integrity will decrease Outcome: Progressing

## 2024-05-02 DIAGNOSIS — R531 Weakness: Secondary | ICD-10-CM | POA: Diagnosis not present

## 2024-05-02 LAB — CBC
HCT: 33.5 % — ABNORMAL LOW (ref 36.0–46.0)
Hemoglobin: 11.2 g/dL — ABNORMAL LOW (ref 12.0–15.0)
MCH: 30.9 pg (ref 26.0–34.0)
MCHC: 33.4 g/dL (ref 30.0–36.0)
MCV: 92.5 fL (ref 80.0–100.0)
Platelets: 187 10*3/uL (ref 150–400)
RBC: 3.62 MIL/uL — ABNORMAL LOW (ref 3.87–5.11)
RDW: 12.5 % (ref 11.5–15.5)
WBC: 6.7 10*3/uL (ref 4.0–10.5)
nRBC: 0 % (ref 0.0–0.2)

## 2024-05-02 LAB — URINE CULTURE: Culture: 20000 — AB

## 2024-05-02 LAB — BASIC METABOLIC PANEL WITH GFR
Anion gap: 10 (ref 5–15)
BUN: 12 mg/dL (ref 8–23)
CO2: 24 mmol/L (ref 22–32)
Calcium: 8.8 mg/dL — ABNORMAL LOW (ref 8.9–10.3)
Chloride: 104 mmol/L (ref 98–111)
Creatinine, Ser: 0.46 mg/dL (ref 0.44–1.00)
GFR, Estimated: >60 mL/min
Glucose, Bld: 93 mg/dL (ref 70–99)
Potassium: 4 mmol/L (ref 3.5–5.1)
Sodium: 138 mmol/L (ref 135–145)

## 2024-05-02 MED ORDER — HYDRALAZINE HCL 20 MG/ML IJ SOLN
5.0000 mg | Freq: Four times a day (QID) | INTRAMUSCULAR | Status: DC | PRN
  Administered 2024-05-03 – 2024-05-04 (×2): 5 mg via INTRAVENOUS
  Filled 2024-05-02 (×3): qty 1

## 2024-05-02 NOTE — Progress Notes (Signed)
 " Progress Note   [name] FMW:[id number] DOB: Oct 02, 1926 DOA: 04/29/2024     1 DOS: the patient was seen and examined on 05/02/2024    Brief hospital course: Becky Montoya is a 89 y.o. female with medical history significant for arthritis, ambulatory dysfunction uses a walker, hypertension, hyperlipidemia, hypothyroidism, who presented to the ER due to generalized weakness, worsening for the past few days.  ED workup revealed UA suggestive of UTI.  RVP was negative.  Patient started on antibiotics.  Assessment and Plan:  Generalized weakness Likely due to UTI. Patient seen by PT and SNF recommended. -Placement pending.   Presumed UTI, POA UA positive for pyuria Cultures grew Staph epidermidis. Patient has received Rocephin  x 4 days. -DC Rocephin .  Right nostril abnormality Chronic right maxillary sinusitis Patient with known history of chronic right maxillary sinusitis, and was seen by ENT in October 2025. CT face and sinuses was done on 1/23.This revealed no CT evidence of mass within the nasal cavity chronic right maxillary sinusitis with complete opacification of the right maxillary sinus and right ostiomeatal unit - Outpatient follow up   Anemia Hemoglobin appears to have trended down from baseline of approximately 14. Patient denies melena. - Will continue to monitor. - Will continue to evaluate for sources of bleeding. - Will hold Lovenox  if hemoglobin continues to trend down.  Hypertension On Coreg , hydrochlorothiazide  and Valsartan at home.  - Continue home Coreg .  - Resumed home Valsartan. - Will hold hydrochlorothiazide  for now as it likely contributed to electrolyte disturbance (hypokalemia)  Hypokalemia Presented with potassium 2.9.  Likely related to hydrochlorothiazide . - Will replete as needed.    Elevated troponin, suspect demand ischemia No reported anginal symptoms. No evidence of acute ischemia on twelve-lead EKG High-sensitivity  troponin flat 27, 30. -Continue to monitor on telemetry.   Hypothyroidism -Continue home levothyroxine .   Arthritis Chronic shoulders pain -Voltaren  gel for muscular and joint pain.   Moderate protein calorie malnutrition Moderate muscle mass loss -Encourage increase oral protein calorie intake         Subjective: Patient still complains of weakness. Placement pending  Physical Exam: BP (!) 176/69 (BP Location: Right Arm)   Pulse 74   Temp 97.8 F (36.6 C)   Resp 20   Ht 5' 2 (1.575 m)   SpO2 98%   BMI 21.58 kg/m    General: Alert, oriented X3  Eyes: Pupils equal, reactive  Oral cavity: moist mucous membranes  Head: Atraumatic, normocephalic  Neck: supple  Chest: clear to auscultation. No crackles, no wheezes  CVS: S1,S2 RRR. No murmurs  Abd: No distention, soft, non-tender. No masses palpable  Extr: No edema   MSK: Hand deformities consistent with arthritis Neurological: Grossly intact.    Data Reviewed:    Latest Ref Rng & Units 05/02/2024    5:32 AM 05/01/2024    6:15 AM 04/30/2024    5:30 AM  CBC  WBC 4.0 - 10.5 K/uL 6.7  7.4  8.2   Hemoglobin 12.0 - 15.0 g/dL 88.7  88.9  88.2   Hematocrit 36.0 - 46.0 % 33.5  32.2  35.2   Platelets 150 - 400 K/uL 187  186  199       Latest Ref Rng & Units 05/02/2024    5:32 AM 05/01/2024    6:15 AM 04/30/2024    5:30 AM  BMP  Glucose 70 - 99 mg/dL 93  99  88   BUN 8 - 23 mg/dL 12  15  15   Creatinine 0.44 - 1.00 mg/dL 9.53  9.45  9.49   Sodium 135 - 145 mmol/L 138  139  137   Potassium 3.5 - 5.1 mmol/L 4.0  4.2  3.7   Chloride 98 - 111 mmol/L 104  107  102   CO2 22 - 32 mmol/L 24  24  25    Calcium  8.9 - 10.3 mg/dL 8.8  8.7  8.6      Family Communication: n/a  Disposition: Status is: Observation DVT PPx: SQ Lovenox       Author: MDALA-GAUSI, Kyaire Gruenewald [name], MD 05/02/2024 3:12 PM  For on call review [email or web address].    "

## 2024-05-02 NOTE — Progress Notes (Signed)
 Physical Therapy Treatment Patient Details Name: Becky Montoya MRN: [id number] DOB: [birth year] Today's Date: 05/02/2024   History of Present Illness Pt is a 89 y.o. female presenting to [facility] ED  04/29/24 with weakness, pyuria.PMH: fractures of the left transverse process of L1, L2, L3 and L5, arthritis, breast cancer, HLD, HTN, osteoporosis    PT Comments  Pt received in recliner agreeable to PT, requesting to toilet on BSC with CNA, with encouragement agreeable to attempt ambulation to bathroom toilet. Pt required modA for lfit assistance from recliner to RW, CGA ambulation 50ftx2 with cues for sequencing, modA for lift assistance from toilet with grab bar in LUE, able to reposition in chair with cues only. Pt pleased with progress. Discharge destination remains appropriate. We will continue to follow acutely.     If plan is discharge home, recommend the following: A little help with walking and/or transfers;A little help with bathing/dressing/bathroom;Assistance with cooking/housework;Assist for transportation;Help with stairs or ramp for entrance   Can travel by private vehicle     Yes  Equipment Recommendations  None recommended by PT    Recommendations for Other Services       Precautions / Restrictions Precautions Precautions: Fall Recall of Precautions/Restrictions: Intact Precaution/Restrictions Comments: chronic shoulder pain (L<R) Restrictions Weight Bearing Restrictions Per Provider Order: No     Mobility  Bed Mobility               General bed mobility comments: OOB in recliner at entry and exit    Transfers Overall transfer level: Needs assistance Equipment used: Rolling walker (2 wheels) Transfers: Sit to/from Stand Sit to Stand: From elevated surface, Mod assist           General transfer comment: modA sts to RW from recliner    Ambulation/Gait Ambulation/Gait assistance: Contact guard assist Gait Distance (Feet): 30 Feet Assistive device:  Rolling walker (2 wheels) Gait Pattern/deviations: Step-through pattern, Trunk flexed Gait velocity: decreased     General Gait Details: Pt instructed in gait training with RW 61ftx2 from recliner to toilet and return, no physical assist required, pt with urinary incontinence on the way to toilet.   Stairs             Wheelchair Mobility     Tilt Bed    Modified Rankin (Stroke Patients Only)       Balance Overall balance assessment: Needs assistance, History of Falls Sitting-balance support: Feet supported, Single extremity supported Sitting balance-Leahy Scale: Fair   Postural control: Posterior lean Standing balance support: Bilateral upper extremity supported, During functional activity, Reliant on assistive device for balance Standing balance-Leahy Scale: Poor                              Communication Communication Communication: No apparent difficulties  Cognition Arousal: Alert Behavior During Therapy: WFL for tasks assessed/performed   PT - Cognitive impairments: No family/caregiver present to determine baseline, Difficult to assess, Problem solving                         Following commands: Intact Following commands impaired: Only follows one step commands consistently    Cueing Cueing Techniques: Verbal cues  Exercises      General Comments        Pertinent Vitals/Pain Pain Assessment Pain Assessment: No/denies pain    Home Living  Prior Function            PT Goals (current goals can now be found in the care plan section) Acute Rehab PT Goals Patient Stated Goal: none stated PT Goal Formulation: With patient Time For Goal Achievement: 05/14/24 Potential to Achieve Goals: Fair Progress towards PT goals: Progressing toward goals    Frequency    Min 2X/week      PT Plan      Co-evaluation              AM-PAC PT 6 Clicks Mobility   Outcome Measure  Help  needed turning from your back to your side while in a flat bed without using bedrails?: A Lot Help needed moving from lying on your back to sitting on the side of a flat bed without using bedrails?: A Lot Help needed moving to and from a bed to a chair (including a wheelchair)?: A Lot Help needed standing up from a chair using your arms (e.g., wheelchair or bedside chair)?: A Lot Help needed to walk in hospital room?: A Lot Help needed climbing 3-5 steps with a railing? : Total 6 Click Score: 11    End of Session Equipment Utilized During Treatment: Gait belt Activity Tolerance: Patient tolerated treatment well;No increased pain Patient left: in bed;with nursing/sitter in room Nurse Communication: Mobility status;Need for lift equipment PT Visit Diagnosis: Unsteadiness on feet (R26.81);History of falling (Z91.81);Difficulty in walking, not elsewhere classified (R26.2)     Time: 8586-8572 PT Time Calculation (min) (ACUTE ONLY): 14 min  Charges:    $Gait Training: 8-22 mins PT General Charges $$ ACUTE PT VISIT: 1 Visit                     Elsie Grieves, PT, DPT [facility] Rehabilitation Department Office: [phone]   Elsie Grieves 05/02/2024, 2:28 PM

## 2024-05-03 ENCOUNTER — Observation Stay (HOSPITAL_COMMUNITY)

## 2024-05-03 DIAGNOSIS — R531 Weakness: Secondary | ICD-10-CM | POA: Diagnosis not present

## 2024-05-03 MED ORDER — DICLOFENAC SODIUM 1 % EX GEL
2.0000 g | Freq: Four times a day (QID) | CUTANEOUS | Status: DC
  Administered 2024-05-03 – 2024-05-08 (×18): 2 g via TOPICAL
  Filled 2024-05-03: qty 100

## 2024-05-03 MED ORDER — CARVEDILOL 25 MG PO TABS
25.0000 mg | ORAL_TABLET | Freq: Two times a day (BID) | ORAL | Status: DC
  Administered 2024-05-03 – 2024-05-08 (×11): 25 mg via ORAL
  Filled 2024-05-03 (×11): qty 1

## 2024-05-03 NOTE — Plan of Care (Signed)

## 2024-05-03 NOTE — Progress Notes (Signed)
 " Progress Note   [name] FMW:[id number] DOB: 09/03/1926 DOA: 04/29/2024     1 DOS: the patient was seen and examined on 05/03/2024    Brief hospital course: Becky Montoya is a 89 y.o. female with medical history significant for arthritis, ambulatory dysfunction uses a walker, hypertension, hyperlipidemia, hypothyroidism, who presented to the ER due to generalized weakness, worsening for a few days.  ED workup revealed UA suggestive of UTI.  RVP was negative.  Patient started on antibiotics.  Assessment and Plan:  Generalized weakness Likely due to UTI. Patient seen by PT and SNF recommended. Patient reports she is getting stronger.  -Placement pending.  Left lower extremity pain.  Reported after walking on 1/24. Of note, patient was seen in August 2025 with left ankle pain. Patient does have some tenderness of her left lower extremity. Low risk of DVT as patient is on subcutaneous Lovenox .  No swelling noted. Arthritis most likely. - X-ray lower extremity   Presumed UTI, POA UA positive for pyuria Cultures grew Staph epidermidis. Patient received Rocephin  x 4 days. Antibiotic course completed.   Right nostril abnormality Chronic right maxillary sinusitis Patient with known history of chronic right maxillary sinusitis, and was seen by ENT in October 2025. CT face and sinuses was done on 1/23.This revealed no CT evidence of mass within the nasal cavity chronic right maxillary sinusitis with complete opacification of the right maxillary sinus and right ostiomeatal unit - Outpatient follow up   Anemia Hemoglobin trended down from baseline of approximately 14 to 11.  Now stable. Patient denies melena. - Will continue to monitor intermittently   Hypertension On Coreg , hydrochlorothiazide  and Valsartan at home.  - Continue home Coreg -dose increased due to uncontrolled pressures. - Resumed home Valsartan. - Will hold hydrochlorothiazide  for now as it likely  contributed to electrolyte disturbance (hypokalemia)  Hypokalemia, resolved Presented with potassium 2.9.  Likely related to hydrochlorothiazide . - Will replete as needed.  -Continue to hold HCTZ.   Elevated troponin, suspect demand ischemia No reported anginal symptoms. No evidence of acute ischemia on twelve-lead EKG High-sensitivity troponin flat 27, 30. -Continue to monitor on telemetry.   Hypothyroidism -Continue home levothyroxine .   Arthritis Chronic shoulders pain -As needed Tylenol . -Voltaren  gel for muscular and joint pain.   Moderate protein calorie malnutrition Moderate muscle mass loss -Encourage increase oral protein calorie intake       Subjective: Patient complains of left lower extremity pain which started after she ambulated yesterday.  Patient denies similar pain in the past, but review of records reveals she has had left ankle pain which was evaluated in the office.  Physical Exam: BP (!) 148/66 (BP Location: Left Arm)   Pulse 79   Temp (!) 97.5 F (36.4 C) (Oral)   Resp 16   Ht 5' 2 (1.575 m)   SpO2 96%   BMI 21.58 kg/m    General: Alert, oriented X3  Eyes: Pupils equal, reactive  Oral cavity: moist mucous membranes  Head: Atraumatic, normocephalic  Neck: supple  Chest: clear to auscultation. No crackles, no wheezes  CVS: S1,S2 RRR. No murmurs  Abd: No distention, soft, non-tender. No masses palpable  Extr: No edema   MSK: Hand deformities consistent with arthritis, tenderness on deep palpation of lateral aspect of left ankle, no swelling of left calf, no swelling of left knee Neurological: Grossly intact.    Data Reviewed:    Latest Ref Rng & Units 05/02/2024    5:32 AM 05/01/2024  6:15 AM 04/30/2024    5:30 AM  CBC  WBC 4.0 - 10.5 K/uL 6.7  7.4  8.2   Hemoglobin 12.0 - 15.0 g/dL 88.7  88.9  88.2   Hematocrit 36.0 - 46.0 % 33.5  32.2  35.2   Platelets 150 - 400 K/uL 187  186  199       Latest Ref Rng & Units 05/02/2024    5:32  AM 05/01/2024    6:15 AM 04/30/2024    5:30 AM  BMP  Glucose 70 - 99 mg/dL 93  99  88   BUN 8 - 23 mg/dL 12  15  15    Creatinine 0.44 - 1.00 mg/dL 9.53  9.45  9.49   Sodium 135 - 145 mmol/L 138  139  137   Potassium 3.5 - 5.1 mmol/L 4.0  4.2  3.7   Chloride 98 - 111 mmol/L 104  107  102   CO2 22 - 32 mmol/L 24  24  25    Calcium  8.9 - 10.3 mg/dL 8.8  8.7  8.6      Family Communication: n/a  Disposition: Status is: Observation DVT PPx: SQ Lovenox       Author: MDALA-GAUSI, Charnelle Bergeman [name], MD 05/03/2024 11:38 AM  For on call review [email or web address].    "

## 2024-05-04 ENCOUNTER — Ambulatory Visit: Admitting: Sports Medicine

## 2024-05-04 ENCOUNTER — Encounter (HOSPITAL_COMMUNITY): Payer: Self-pay | Admitting: Internal Medicine

## 2024-05-04 DIAGNOSIS — R531 Weakness: Secondary | ICD-10-CM | POA: Diagnosis not present

## 2024-05-04 LAB — CBC
HCT: 32.7 % — ABNORMAL LOW (ref 36.0–46.0)
Hemoglobin: 11.4 g/dL — ABNORMAL LOW (ref 12.0–15.0)
MCH: 31.8 pg (ref 26.0–34.0)
MCHC: 34.9 g/dL (ref 30.0–36.0)
MCV: 91.1 fL (ref 80.0–100.0)
Platelets: 208 10*3/uL (ref 150–400)
RBC: 3.59 MIL/uL — ABNORMAL LOW (ref 3.87–5.11)
RDW: 12.7 % (ref 11.5–15.5)
WBC: 5.6 10*3/uL (ref 4.0–10.5)
nRBC: 0 % (ref 0.0–0.2)

## 2024-05-04 NOTE — NC FL2 (Signed)
 " [place]  MEDICAID [id number] LEVEL OF CARE FORM     IDENTIFICATION  Patient Name: Becky Montoya Birthdate: Apr 24, 1926 Sex: female Admission Date (Current Location): 04/29/2024  [facility] and [place] Number:  Producer, Television/film/video and Address:  [facility],  [address] [place], [place] [address]      Provider Number: [id number]  Attending Physician Name and Address:  Jearlean Harpin Agat*  Relative Name and Phone Number:  Kate Lee (daughter) [phone]    Current Level of Care: Hospital Recommended Level of Care: Skilled Nursing Facility Prior Approval Number:    Date Approved/Denied:   PASRR Number: [id number]  Discharge Plan: SNF    Current Diagnoses: Patient Active Problem List   Diagnosis Date Noted   Generalized weakness 04/29/2024   Lumbar transverse process fracture, closed, initial encounter (HCC) 03/11/2024   Hyponatremia 03/10/2024   Fall 03/09/2024   Unilateral primary osteoarthritis, right hip 05/31/2023   Stress reaction of shaft of femur, right, initial encounter 11/20/2019   Age-related osteoporosis without current pathological fracture 07/31/2019   Fall at home, initial encounter 10/19/2018   Closed left subtrochanteric femur fracture, initial encounter (HCC) 10/18/2018   Closed left subtrochanteric femur fracture (HCC) 10/18/2018   Ductal carcinoma in situ (DCIS) of left breast 12/27/2016   Mastitis, left, acute 12/19/2012   Malignant neoplasm of upper-outer quadrant of left breast in female, estrogen receptor positive (HCC) 09/15/2012   Chest pain 05/28/2012   Hypothyroidism 05/28/2012   Hypertension 05/28/2012   Hyperlipidemia 05/28/2012    Orientation RESPIRATION BLADDER Height & Weight     Self, Time, Situation, Place  Normal Continent Weight: 54.4 kg Height:  5' 2 (157.5 cm)  BEHAVIORAL SYMPTOMS/MOOD NEUROLOGICAL BOWEL NUTRITION STATUS      Continent Diet  AMBULATORY STATUS COMMUNICATION OF NEEDS Skin   Extensive Assist  Verbally Normal                       Personal Care Assistance Level of Assistance  Bathing, Feeding, Dressing Bathing Assistance: Limited assistance Feeding assistance: Independent Dressing Assistance: Limited assistance     Functional Limitations Info  Sight, Hearing, Speech Sight Info: Impaired Hearing Info: Adequate Speech Info: Adequate    SPECIAL CARE FACTORS FREQUENCY  PT (By licensed PT), OT (By licensed OT)     PT Frequency: 5x weekly OT Frequency: 5x weekly            Contractures Contractures Info: Not present    Additional Factors Info  Code Status, Allergies Code Status Info: DNR-Limited Allergies Info: Wellbutrin           Current Medications (05/04/2024):  This is the current hospital active medication list Current Facility-Administered Medications  Medication Dose Route Frequency Provider Last Rate Last Admin   acetaminophen  (TYLENOL ) tablet 650 mg  650 mg Oral Q6H PRN Hall, Carole N, DO   650 mg at 05/03/24 0138   carvedilol  (COREG ) tablet 25 mg  25 mg Oral BID WC Mdala-Gausi, Masiku [name], MD   25 mg at 05/04/24 1020   diclofenac  Sodium (VOLTAREN ) 1 % topical gel 2 g  2 g Topical QID Mdala-Gausi, Masiku [name], MD   2 g at 05/04/24 1021   enoxaparin  (LOVENOX ) injection 40 mg  40 mg Subcutaneous Q24H Shona Laurence N, DO   40 mg at 05/04/24 1021   hydrALAZINE  (APRESOLINE ) injection 5 mg  5 mg Intravenous Q6H PRN Chavez, Abigail, NP   5 mg at 05/03/24 0021   irbesartan  (AVAPRO )  tablet 300 mg  300 mg Oral Daily Mdala-Gausi, Masiku [name], MD   300 mg at 05/04/24 1020   levothyroxine  (SYNTHROID ) tablet 50 mcg  50 mcg Oral Q0600 Shona Terry SAILOR, DO   50 mcg at 05/04/24 9389   LORazepam  (ATIVAN ) tablet 0.25-0.5 mg  0.25-0.5 mg Oral Daily PRN Chavez, Abigail, NP   0.5 mg at 05/03/24 2157   melatonin tablet 5 mg  5 mg Oral QHS PRN Shona Terry SAILOR, DO   5 mg at 05/03/24 2157   mirtazapine  (REMERON ) tablet 15 mg  15 mg Oral QHS Chavez, Abigail, NP   15 mg at  05/03/24 2157   polyethylene glycol (MIRALAX  / GLYCOLAX ) packet 17 g  17 g Oral Daily PRN Shona Terry SAILOR, DO       prochlorperazine  (COMPAZINE ) injection 5 mg  5 mg Intravenous Q6H PRN Shona Terry N, DO       simvastatin  (ZOCOR ) tablet 20 mg  20 mg Oral QPM Mdala-Gausi, Masiku [name], MD   20 mg at 05/03/24 1716     Discharge Medications: Please see discharge summary for a list of discharge medications.  Relevant Imaging Results:  Relevant Lab Results:   Additional Information SSN: [id number]  Doneta Glenys DASEN, RN     "

## 2024-05-04 NOTE — Progress Notes (Signed)
 " Progress Note   [name] FMW:[id number] DOB: 08-18-1926 DOA: 04/29/2024     0 DOS: the patient was seen and examined on 05/04/2024    Brief hospital course: Becky Montoya is a 89 y.o. female with medical history significant for arthritis, ambulatory dysfunction uses a walker, hypertension, hyperlipidemia, hypothyroidism, who presented to the ER due to generalized weakness, worsening for a few days.  ED workup revealed UA suggestive of UTI.  RVP was negative.  Patient started on antibiotics.  Assessment and Plan:  Generalized weakness Likely due to UTI. Patient seen by PT and SNF recommended. -Placement pending.  Left lower extremity pain.  Reported after walking on 1/24. Of note, patient was seen in August 2025 with left ankle pain. Patient does have some tenderness of her left lower extremity. Low risk of DVT as patient is on subcutaneous Lovenox .  No swelling noted. X-ray of the lower extremities revealed arthritis. - Continue pain management.   Presumed UTI, POA UA positive for pyuria Cultures grew Staph epidermidis. Patient received Rocephin  x 4 days. Antibiotic course completed.   Right nostril abnormality Chronic right maxillary sinusitis Patient with known history of chronic right maxillary sinusitis, and was seen by ENT in October 2025. CT face and sinuses was done on 1/23.This revealed no CT evidence of mass within the nasal cavity chronic right maxillary sinusitis with complete opacification of the right maxillary sinus and right ostiomeatal unit - Outpatient follow up   Anemia Hemoglobin trended down from baseline of approximately 14 to 11.  Now stable. Patient denies melena. - Will continue to monitor intermittently   Hypertension On Coreg , hydrochlorothiazide  and Valsartan at home.  - Continue home Coreg -dose increased due to uncontrolled pressures. - Resumed home Valsartan. - Will hold hydrochlorothiazide  for now as it likely contributed  to electrolyte disturbance (hypokalemia)  Hypokalemia, resolved Presented with potassium 2.9.  Likely related to hydrochlorothiazide . - Will replete as needed.  -Continue to hold HCTZ.   Elevated troponin, suspect demand ischemia No reported anginal symptoms. No evidence of acute ischemia on twelve-lead EKG High-sensitivity troponin flat 27, 30. -Continue to monitor on telemetry.   Hypothyroidism -Continue home levothyroxine .   Arthritis Chronic shoulders pain -As needed Tylenol . -Voltaren  gel for muscular and joint pain.   Moderate protein calorie malnutrition Moderate muscle mass loss -Encourage increase oral protein calorie intake       Subjective: Patient states her left lower extremity pain is better today. Physical Exam: BP (!) 137/57 (BP Location: Right Arm)   Pulse 70   Temp 98.3 F (36.8 C)   Resp 18   Ht 5' 2 (1.575 m)   Wt 54.4 kg   SpO2 98%   BMI 21.95 kg/m    General: Alert, oriented X3  Eyes: Pupils equal, reactive  Oral cavity: moist mucous membranes  Head: Atraumatic, normocephalic  Neck: supple  Chest: clear to auscultation. No crackles, no wheezes  CVS: S1,S2 RRR. No murmurs  Abd: No distention, soft, non-tender. No masses palpable  Extr: No edema   MSK: Hand deformities consistent with arthritis, tenderness on deep palpation of lateral aspect of left ankle, no swelling of left calf, no swelling of left knee Neurological: Grossly intact.    Data Reviewed:    Latest Ref Rng & Units 05/04/2024    5:02 AM 05/02/2024    5:32 AM 05/01/2024    6:15 AM  CBC  WBC 4.0 - 10.5 K/uL 5.6  6.7  7.4   Hemoglobin 12.0 - 15.0  g/dL 88.5  88.7  88.9   Hematocrit 36.0 - 46.0 % 32.7  33.5  32.2   Platelets 150 - 400 K/uL 208  187  186       Latest Ref Rng & Units 05/02/2024    5:32 AM 05/01/2024    6:15 AM 04/30/2024    5:30 AM  BMP  Glucose 70 - 99 mg/dL 93  99  88   BUN 8 - 23 mg/dL 12  15  15    Creatinine 0.44 - 1.00 mg/dL 9.53  9.45  9.49    Sodium 135 - 145 mmol/L 138  139  137   Potassium 3.5 - 5.1 mmol/L 4.0  4.2  3.7   Chloride 98 - 111 mmol/L 104  107  102   CO2 22 - 32 mmol/L 24  24  25    Calcium  8.9 - 10.3 mg/dL 8.8  8.7  8.6      Family Communication: n/a  Disposition: Status is: Observation DVT PPx: SQ Lovenox       Author: MDALA-GAUSI, Elier Zellars [name], MD 05/04/2024 2:41 PM  For on call review [email or web address]    "

## 2024-05-04 NOTE — Plan of Care (Signed)
  Problem: Education: Goal: Knowledge of General Education information will improve Description: Including pain rating scale, medication(s)/side effects and non-pharmacologic comfort measures Outcome: Progressing   Problem: Clinical Measurements: Goal: Will remain free from infection Outcome: Progressing   Problem: Activity: Goal: Risk for activity intolerance will decrease Outcome: Progressing   Problem: Nutrition: Goal: Adequate nutrition will be maintained Outcome: Progressing   Problem: Coping: Goal: Level of anxiety will decrease Outcome: Progressing   Problem: Elimination: Goal: Will not experience complications related to bowel motility Outcome: Progressing Goal: Will not experience complications related to urinary retention Outcome: Progressing   Problem: Pain Managment: Goal: General experience of comfort will improve and/or be controlled Outcome: Progressing   Problem: Safety: Goal: Ability to remain free from injury will improve Outcome: Progressing   Problem: Skin Integrity: Goal: Risk for impaired skin integrity will decrease Outcome: Progressing

## 2024-05-04 NOTE — TOC Initial Note (Addendum)
 Transition of Care [facility]) - Initial/Assessment Note    Patient Details  Name: Becky Montoya MRN: [id number] Date of Birth: [birth year]  [facility]) CM/SW Contact:    Doneta Glenys DASEN, RN Phone Number: 05/04/2024, 12:42 PM  Clinical Narrative:     1:19 PM FL2 complete with PASRR, SNF referrals sent via HUB with preference  to [facility]. Insurance auth started-pending CM called [facility] - states that [facility] not have a bed probably until Friday. 12:42 PM            PTA lives at [facility].  Expected Discharge Plan: Skilled Nursing Facility Barriers to Discharge: Continued Medical Work up, SNF Pending bed offer   Patient Goals and CMS Choice Patient states their goals for this hospitalization and ongoing recovery are:: to [place].gov Compare Post Acute Care list provided to:: Patient Choice offered to / list presented to : Patient [place] ownership interest in [facility].provided to:: Patient    Expected Discharge Plan and Services In-house Referral: NA Discharge Planning Services: CM Consult Post Acute Care Choice: NA Living arrangements for the past 2 months: Independent Living Facility [facility]ochem[facility], Clinical)                 DME Arranged: N/A DME Agency: NA       HH Arranged: NA HH Agency: NA        Prior Living Arrangements/Services Living arrangements for the past 2 months: Independent [facility], Clinical) Lives with:: Facility Resident Patient language and need for interpreter reviewed:: Yes Do you feel safe going back to the place where you live?: Yes      Need for Family Participation in Patient Care: Yes (Comment) Care giver support system in place?: Yes (comment) Current home services:  (NA) Criminal Activity/Legal Involvement Pertinent to Current Situation/Hospitalization: No - Comment as needed  Activities of Daily Living   ADL Screening (condition at time of  admission) Independently performs ADLs?: No Does the patient have a NEW difficulty with bathing/dressing/toileting/self-feeding that is expected to last >3 days?: Yes (Initiates electronic notice to provider for possible OT consult) Does the patient have a NEW difficulty with getting in/out of bed, walking, or climbing stairs that is expected to last >3 days?: Yes (Initiates electronic notice to provider for possible PT consult) Does the patient have a NEW difficulty with communication that is expected to last >3 days?: No Is the patient deaf or have difficulty hearing?: Yes Does the patient have difficulty seeing, even when wearing glasses/contacts?: No Does the patient have difficulty concentrating, remembering, or making decisions?: No  Permission Sought/Granted Permission sought to share information with : Case Manager Permission granted to share information with : Yes, Verbal Permission Granted  Share Information with NAME: Lennie Poe  Daughter, Emergency Contact  [phone]  Permission granted to share info w AGENCY: [facility]        Emotional Assessment Appearance:: Appears stated age Attitude/Demeanor/Rapport: Engaged Affect (typically observed): Appropriate Orientation: : Oriented to Self, Oriented to Place, Oriented to  Time, Oriented to Situation Alcohol  / Substance Use: Not Applicable Psych Involvement: No (comment)  Admission diagnosis:  Hypokalemia [E87.6] Weakness [R53.1] Acute cystitis with hematuria [N30.01] Generalized weakness [R53.1] Patient Active Problem List   Diagnosis Date Noted   Generalized weakness 04/29/2024   Lumbar transverse process fracture, closed, initial encounter (HCC) 03/11/2024   Hyponatremia 03/10/2024   Fall 03/09/2024   Unilateral primary osteoarthritis, right hip 05/31/2023   Stress reaction of shaft of  femur, right, initial encounter 11/20/2019   Age-related osteoporosis without current pathological fracture 07/31/2019   Fall  at home, initial encounter 10/19/2018   Closed left subtrochanteric femur fracture, initial encounter (HCC) 10/18/2018   Closed left subtrochanteric femur fracture (HCC) 10/18/2018   Ductal carcinoma in situ (DCIS) of left breast 12/27/2016   Mastitis, left, acute 12/19/2012   Malignant neoplasm of upper-outer quadrant of left breast in female, estrogen receptor positive (HCC) 09/15/2012   Chest pain 05/28/2012   Hypothyroidism 05/28/2012   Hypertension 05/28/2012   Hyperlipidemia 05/28/2012   PCP:  Teresa Channel, MD Pharmacy:   [phone] [place], [place] - [address] [place] [address] Phone: [phone] Fax: [phone]  [id number] [place], [place] - [address] AT [facility] [address] [place] [address] Phone: [phone] Fax: [phone]  [id number] [place], [place] - [address] [place] [address] Phone: [phone] Fax: [phone]  [facility] - [place], [place] - [address] [place] [address] Phone: [phone] Fax: [phone]     Social Drivers of Health (SDOH) Social History: SDOH Screenings   Food Insecurity: No Food Insecurity (04/30/2024)  Housing: Low Risk (04/30/2024)  Transportation Needs: No Transportation Needs (04/30/2024)  Utilities: Not At Risk (04/30/2024)  Social Connections: Moderately Integrated (04/30/2024)  Tobacco Use: Low Risk (05/04/2024)   SDOH Interventions:     Readmission Risk Interventions    03/10/2024   11:23 AM  Readmission Risk Prevention Plan  Transportation Screening Complete  PCP or Specialist Appt within 5-7 Days Complete  Home Care Screening Complete  Medication Review (RN CM) Complete

## 2024-05-05 ENCOUNTER — Ambulatory Visit: Admitting: Sports Medicine

## 2024-05-05 DIAGNOSIS — R531 Weakness: Secondary | ICD-10-CM | POA: Diagnosis not present

## 2024-05-05 MED ORDER — AMLODIPINE BESYLATE 5 MG PO TABS
5.0000 mg | ORAL_TABLET | Freq: Every day | ORAL | Status: DC
  Administered 2024-05-05 – 2024-05-08 (×4): 5 mg via ORAL
  Filled 2024-05-05 (×4): qty 1

## 2024-05-05 NOTE — Progress Notes (Signed)
 Physical Therapy Treatment Patient Details Name: Becky Montoya MRN: [id number] DOB: Apr 10, 1926 Today's Date: 05/05/2024   History of Present Illness Pt is a 89 y.o. female presenting to [facility] ED  04/29/24 with weakness, pyuria.PMH: fractures of the left transverse process of L1, L2, L3 and L5, arthritis, breast cancer, HLD, HTN, osteoporosis    PT Comments  Pt agreeable to working with therapy. Min-Mod A for safe mobility on today. Practiced transfers and gait training during session. Rest breaks taken as needed. Patient will benefit from continued inpatient follow up therapy, <3 hours/day     If plan is discharge home, recommend the following: A little help with walking and/or transfers;A little help with bathing/dressing/bathroom;Assistance with cooking/housework;Assist for transportation;Help with stairs or ramp for entrance   Can travel by private vehicle     Yes  Equipment Recommendations  None recommended by PT    Recommendations for Other Services       Precautions / Restrictions Precautions Precautions: Fall Precaution/Restrictions Comments: chronic shoulder pain (L<R) Restrictions Weight Bearing Restrictions Per Provider Order: No     Mobility  Bed Mobility               General bed mobility comments: OOB in recliner    Transfers Overall transfer level: Needs assistance Equipment used: Rolling walker (2 wheels) Transfers: Sit to/from Stand Sit to Stand: Mod assist           General transfer comment: x 2. Cues for safety, technique, hand/feet placement. Assist to rise, steady, control descent. Increased time    Ambulation/Gait Ambulation/Gait assistance: Min assist Gait Distance (Feet): 15 Feet (x2) Assistive device: Rolling walker (2 wheels) Gait Pattern/deviations: Step-to pattern, Trunk flexed       General Gait Details: Assist to stabilize pt throughout distance. Cues for safety, RW proximity, good clearance step on L. Seated rest break taken  between walks.   Stairs             Wheelchair Mobility     Tilt Bed    Modified Rankin (Stroke Patients Only)       Balance Overall balance assessment: Needs assistance, History of Falls         Standing balance support: Bilateral upper extremity supported, During functional activity, Reliant on assistive device for balance Standing balance-Leahy Scale: Poor                              Communication Communication Communication: No apparent difficulties  Cognition Arousal: Alert Behavior During Therapy: WFL for tasks assessed/performed   PT - Cognitive impairments: No family/caregiver present to determine baseline, Difficult to assess, Problem solving                       PT - Cognition Comments: generally follows directions, some delay Following commands: Intact Following commands impaired: Only follows one step commands consistently    Cueing Cueing Techniques: Verbal cues  Exercises      General Comments        Pertinent Vitals/Pain Pain Assessment Pain Assessment: Faces Faces Pain Scale: Hurts a little bit Pain Location: shoulders, LEs Pain Descriptors / Indicators: Aching, Discomfort Pain Intervention(s): Limited activity within patient's tolerance, Monitored during session, Repositioned    Home Living                          Prior Function  PT Goals (current goals can now be found in the care plan section) Progress towards PT goals: Progressing toward goals    Frequency    Min 2X/week      PT Plan      Co-evaluation              AM-PAC PT 6 Clicks Mobility   Outcome Measure  Help needed turning from your back to your side while in a flat bed without using bedrails?: A Little Help needed moving from lying on your back to sitting on the side of a flat bed without using bedrails?: A Lot Help needed moving to and from a bed to a chair (including a wheelchair)?: A Little Help  needed standing up from a chair using your arms (e.g., wheelchair or bedside chair)?: A Lot Help needed to walk in hospital room?: A Little Help needed climbing 3-5 steps with a railing? : Total 6 Click Score: 14    End of Session Equipment Utilized During Treatment: Gait belt Activity Tolerance: Patient tolerated treatment well Patient left: in chair;with call bell/phone within reach   PT Visit Diagnosis: Unsteadiness on feet (R26.81);History of falling (Z91.81);Difficulty in walking, not elsewhere classified (R26.2)     Time: 8399-8376 PT Time Calculation (min) (ACUTE ONLY): 23 min  Charges:    $Gait Training: 23-37 mins PT General Charges $$ ACUTE PT VISIT: 1 Visit                         Dannial SQUIBB, PT Acute Rehabilitation  Office: [phone]

## 2024-05-05 NOTE — Plan of Care (Signed)
" °  Problem: Health Behavior/Discharge Planning: Goal: Ability to manage health-related needs will improve Outcome: Progressing   Problem: Clinical Measurements: Goal: Will remain free from infection Outcome: Progressing   Problem: Nutrition: Goal: Adequate nutrition will be maintained Outcome: Progressing   Problem: Elimination: Goal: Will not experience complications related to bowel motility Outcome: Progressing Goal: Will not experience complications related to urinary retention Outcome: Progressing   Problem: Safety: Goal: Ability to remain free from injury will improve Outcome: Progressing   "

## 2024-05-05 NOTE — Progress Notes (Signed)
 " Progress Note   [name] FMW:[id number] DOB: 09-07-1926 DOA: 04/29/2024     0 DOS: the patient was seen and examined on 05/05/2024    Brief hospital course: Becky Montoya is a 89 y.o. female with medical history significant for arthritis, ambulatory dysfunction uses a walker, hypertension, hyperlipidemia, hypothyroidism, who presented to the ER due to generalized weakness, worsening for a few days.  ED workup revealed UA suggestive of UTI.  RVP was negative.  Patient completed a 4-day course of antibiotics.   Assessment and Plan:  Generalized weakness Likely due to UTI. Patient seen by PT and SNF recommended. -Placement pending.  Left lower extremity pain due to arthritis.  Reported after walking on 1/24. Of note, patient was seen in August 2025 with left ankle pain. Patient does have some tenderness of her left lower extremity. Low risk of DVT as patient is on subcutaneous Lovenox .  No swelling noted. X-ray of the lower extremities revealed arthritis. - Continue pain management.   Presumed UTI, POA UA positive for pyuria Cultures grew Staph epidermidis. Patient received Rocephin  x 4 days. Antibiotic course completed.   Right nostril abnormality Chronic right maxillary sinusitis Patient with known history of chronic right maxillary sinusitis, and was seen by ENT in October 2025. CT face and sinuses was done on 1/23.This revealed no CT evidence of mass within the nasal cavity chronic right maxillary sinusitis with complete opacification of the right maxillary sinus and right ostiomeatal unit - Outpatient follow up   Anemia Hemoglobin trended down from baseline of approximately 14 to 11.  Now stable. Patient denies melena. - Will continue to monitor intermittently   Hypertension On Coreg , hydrochlorothiazide  and Valsartan at home.  - Continue home Coreg -dose increased due to uncontrolled pressures. - Resumed home Valsartan. - Dced hydrochlorothiazide  as  it likely contributed to electrolyte disturbance (hypokalemia). - Started on amlodipine  on 1/27 due to uncontrolled pressures.   Hypokalemia, resolved Presented with potassium 2.9.  Likely related to hydrochlorothiazide . - Will replete as needed.  - hydrochlorothiazide  discontinued, and will not resume.    Elevated troponin, suspect demand ischemia No reported anginal symptoms. No evidence of acute ischemia on twelve-lead EKG High-sensitivity troponin flat 27, 30. -Continue to monitor on telemetry.   Hypothyroidism -Continue home levothyroxine .   Arthritis Chronic shoulders pain -As needed Tylenol . -Voltaren  gel for muscular and joint pain.   Moderate protein calorie malnutrition Moderate muscle mass loss -Encourage increase oral protein calorie intake       Subjective: Patient has no new complaints today. Placement is pending.   Physical Exam: BP (!) 155/69 (BP Location: Right Wrist)   Pulse 72   Temp 97.9 F (36.6 C) (Oral)   Resp 17   Ht 5' 2 (1.575 m)   Wt 54.4 kg   SpO2 95%   BMI 21.95 kg/m    General: Alert, oriented X3  Eyes: Pupils equal, reactive  Oral cavity: moist mucous membranes  Head: Atraumatic, normocephalic  Neck: supple  Chest: clear to auscultation. No crackles, no wheezes  CVS: S1,S2 RRR. No murmurs  Abd: No distention, soft, non-tender. No masses palpable  Extr: No edema   MSK: Hand deformities consistent with arthritis, tenderness on deep palpation of lateral aspect of left ankle, no swelling of left calf, no swelling of left knee Neurological: Grossly intact.     Data Reviewed:    Latest Ref Rng & Units 05/04/2024    5:02 AM 05/02/2024    5:32 AM 05/01/2024  6:15 AM  CBC  WBC 4.0 - 10.5 K/uL 5.6  6.7  7.4   Hemoglobin 12.0 - 15.0 g/dL 88.5  88.7  88.9   Hematocrit 36.0 - 46.0 % 32.7  33.5  32.2   Platelets 150 - 400 K/uL 208  187  186       Latest Ref Rng & Units 05/02/2024    5:32 AM 05/01/2024    6:15 AM 04/30/2024     5:30 AM  BMP  Glucose 70 - 99 mg/dL 93  99  88   BUN 8 - 23 mg/dL 12  15  15    Creatinine 0.44 - 1.00 mg/dL 9.53  9.45  9.49   Sodium 135 - 145 mmol/L 138  139  137   Potassium 3.5 - 5.1 mmol/L 4.0  4.2  3.7   Chloride 98 - 111 mmol/L 104  107  102   CO2 22 - 32 mmol/L 24  24  25    Calcium  8.9 - 10.3 mg/dL 8.8  8.7  8.6      Family Communication: n/a  Disposition: Status is: Observation DVT PPx: SQ Lovenox       Author: MDALA-GAUSI, Smiley Birr [name], MD 05/05/2024 12:24 PM  For on call review [email or web address]    "

## 2024-05-06 DIAGNOSIS — R531 Weakness: Secondary | ICD-10-CM | POA: Diagnosis not present

## 2024-05-06 NOTE — TOC Progression Note (Addendum)
 Transition of Care [facility]) - Progression Note    Patient Details  Name: Becky Montoya MRN: [id number] Date of Birth: Sep 09, 1926  Transition of [facility]) CM/SW Contact  Tawni CHRISTELLA Eva, LCSW Phone Number: 05/06/2024, 11:19 AM  Clinical Narrative:     Received message from [facility] pt's  SNF bed will be available Friday. CSW to initiate insurance auth ICM to follow.    Adden  11:30am Pt's insurnacre auth was starred on 1/26 and expires on 1/29. Pt may need new insurance auth on 1/30. ICM to follow.   Expected Discharge Plan: Skilled Nursing Facility Barriers to Discharge: Continued Medical Work up, SNF Pending bed offer               Expected Discharge Plan and Services In-house Referral: NA Discharge Planning Services: CM Consult Post Acute Care Choice: NA Living arrangements for the past 2 months: Independent Living Facility Biochemist, Clinical)                 DME Arranged: N/A DME Agency: NA       HH Arranged: NA HH Agency: NA         Social Drivers of Health (SDOH) Interventions SDOH Screenings   Food Insecurity: No Food Insecurity (04/30/2024)  Housing: Low Risk (04/30/2024)  Transportation Needs: No Transportation Needs (04/30/2024)  Utilities: Not At Risk (04/30/2024)  Social Connections: Moderately Integrated (04/30/2024)  Tobacco Use: Low Risk (05/04/2024)    Readmission Risk Interventions    03/10/2024   11:23 AM  Readmission Risk Prevention Plan  Transportation Screening Complete  PCP or Specialist Appt within 5-7 Days Complete  Home Care Screening Complete  Medication Review (RN CM) Complete

## 2024-05-06 NOTE — Progress Notes (Signed)
 Occupational Therapy Treatment Patient Details Name: Becky Montoya MRN: [id number] DOB: 11-23-1926 Today's Date: 05/06/2024   History of present illness Pt is a 89 y.o. female presenting to [facility] ED  04/29/24 with weakness, pyuria.PMH: fractures of the left transverse process of L1, L2, L3 and L5, arthritis, breast cancer, HLD, HTN, osteoporosis   OT comments  Pt sitting in recliner upon therapy arrival and agreeable to participate in OT treatment session focusing on ambulatory transfers to/from the toilet. Pt reports that she did walk to the toilet once with use of RW although she was hurting a lot afterwards. Modified activity by utilizing BSC and placing it just outside of bathroom to cut down on walking distance. Pt able to complete toileting and hand hygiene improvement noted since initial evaluation. Patient will benefit from continued inpatient follow up therapy, <3 hours/day.       If plan is discharge home, recommend the following:  A little help with walking and/or transfers;A little help with bathing/dressing/bathroom   Equipment Recommendations  None recommended by OT    Recommendations for Other Services      Precautions / Restrictions Precautions Precautions: Fall Recall of Precautions/Restrictions: Intact Precaution/Restrictions Comments: chronic shoulder pain (L<R) Restrictions Weight Bearing Restrictions Per Provider Order: No       Mobility Bed Mobility    General bed mobility comments: OOB in recliner    Transfers Overall transfer level: Needs assistance Equipment used: Rolling walker (2 wheels) Transfers: Sit to/from Stand, Bed to chair/wheelchair/BSC Sit to Stand: Min assist     Step pivot transfers: Min assist     General transfer comment: VC for hand placement during RW management prior to sit<>stand transition.     Balance Overall balance assessment: Needs assistance, History of Falls Sitting-balance support: Feet supported, Single extremity  supported Sitting balance-Leahy Scale: Good     Standing balance support: Bilateral upper extremity supported, During functional activity Standing balance-Leahy Scale: Fair Standing balance comment: Able to stand and pull undies up over hips without holding onto external support          ADL either performed or assessed with clinical judgement   ADL Overall ADL's : Needs assistance/impaired     Grooming: Wash/dry hands;Standing;Set up Grooming Details (indicate cue type and reason): Stood at sink. Provided set-up of soap d/t to limited shoulder mobility.    Lower Body Dressing: Set up;Sitting/lateral leans Lower Body Dressing Details (indicate cue type and reason): sitting in recliner, pt donned slip on shoes with set-up. Toilet Transfer: Contact guard assist;Ambulation;BSC/3in1;Rolling walker (2 wheels) Toilet Transfer Details (indicate cue type and reason): BSC placed at bathroom door distance to work on activity tolerance/endurance and walking endurance. Toileting- Clothing Manipulation and Hygiene: Set up;Sitting/lateral lean;Sit to/from stand Toileting - Clothing Manipulation Details (indicate cue type and reason): Provided toilet paper and wash clothes for hygiene. While standing, pt pulled mesh undies down/up over hips with OT holding gown out of way.                      Communication Communication Communication: No apparent difficulties   Cognition Arousal: Alert Behavior During Therapy: WFL for tasks assessed/performed Cognition: No apparent impairments      Following commands: Intact Following commands impaired: Only follows one step commands consistently      Cueing   Cueing Techniques: Verbal cues             Pertinent Vitals/ Pain       Pain Assessment Pain Assessment:  No/denies pain Pain Score: 0-No pain         Frequency  Min 2X/week        Progress Toward Goals  OT Goals(current goals can now be found in the care plan section)   Progress towards OT goals: Progressing toward goals            AM-PAC OT 6 Clicks Daily Activity     Outcome Measure   Help from another person eating meals?: None Help from another person taking care of personal grooming?: A Little Help from another person toileting, which includes using toliet, bedpan, or urinal?: A Little Help from another person bathing (including washing, rinsing, drying)?: A Lot Help from another person to put on and taking off regular upper body clothing?: A Little Help from another person to put on and taking off regular lower body clothing?: A Lot 6 Click Score: 17    End of Session Equipment Utilized During Treatment: Gait belt;Rolling walker (2 wheels)  OT Visit Diagnosis: Muscle weakness (generalized) (M62.81)   Activity Tolerance Patient tolerated treatment well   Patient Left in chair;with call bell/phone within reach;with chair alarm set           Time: 8486-8460 OT Time Calculation (min): 26 min  Charges: OT General Charges $OT Visit: 1 Visit OT Treatments $Self Care/Home Management : 23-37 mins  Leita Howell, OTR/L,CBIS  Supplemental OT - [facility] and [facility] Secure Chat Preferred    Becky Montoya, Leita BIRCH 05/06/2024, 4:39 PM

## 2024-05-06 NOTE — Plan of Care (Signed)

## 2024-05-06 NOTE — Plan of Care (Signed)
" °  Problem: Clinical Measurements: Goal: Will remain free from infection Outcome: Progressing   Problem: Elimination: Goal: Will not experience complications related to bowel motility Outcome: Progressing Goal: Will not experience complications related to urinary retention Outcome: Progressing   Problem: Pain Managment: Goal: General experience of comfort will improve and/or be controlled Outcome: Progressing   Problem: Safety: Goal: Ability to remain free from injury will improve Outcome: Progressing   Problem: Clinical Measurements: Goal: Will remain free from infection Outcome: Progressing   Problem: Elimination: Goal: Will not experience complications related to bowel motility Outcome: Progressing Goal: Will not experience complications related to urinary retention Outcome: Progressing   Problem: Pain Managment: Goal: General experience of comfort will improve and/or be controlled Outcome: Progressing   Problem: Safety: Goal: Ability to remain free from injury will improve Outcome: Progressing   "

## 2024-05-06 NOTE — Progress Notes (Signed)
 " Progress Note   [name] FMW:[id number] DOB: Aug 16, 1926 DOA: 04/29/2024     0 DOS: the patient was seen and examined on 05/06/2024    Brief hospital course: Becky Montoya is a 89 y.o. female with medical history significant for arthritis, ambulatory dysfunction uses a walker, hypertension, hyperlipidemia, hypothyroidism, who presented to the ER due to generalized weakness, worsening for a few days.  ED workup revealed UA suggestive of UTI.  RVP was negative.  Patient completed a 4-day course of antibiotics.   Assessment and Plan:  Generalized weakness Likely due to UTI. Patient seen by PT and SNF recommended. -Placement pending. Per SW, bed will be available 1/30.   Left lower extremity pain due to arthritis.  Reported after walking on 1/24. Of note, patient was seen in August 2025 with left ankle pain. Patient does have some tenderness of her left lower extremity. Low risk of DVT as patient is on subcutaneous Lovenox .  No swelling noted. X-ray of the lower extremities revealed arthritis. - Continue pain management.   Presumed UTI, POA UA positive for pyuria Cultures grew Staph epidermidis. Patient received Rocephin  x 4 days. Antibiotic course completed.   Right nostril abnormality Chronic right maxillary sinusitis Patient with known history of chronic right maxillary sinusitis, and was seen by ENT in October 2025. CT face and sinuses was done on 1/23.This revealed no CT evidence of mass within the nasal cavity chronic right maxillary sinusitis with complete opacification of the right maxillary sinus and right ostiomeatal unit - Outpatient follow up   Anemia Hemoglobin trended down from baseline of approximately 14 to 11.  Now stable. Patient denies melena. - Will continue to monitor intermittently   Hypertension On Coreg , hydrochlorothiazide  and Valsartan at home.  - Continue home Coreg -dose increased due to uncontrolled pressures. - Resumed home  Valsartan. - Dced hydrochlorothiazide  as it likely contributed to electrolyte disturbance (hypokalemia). - Started on amlodipine  on 1/27 due to uncontrolled pressures.   Hypokalemia, resolved Presented with potassium 2.9.  Likely related to hydrochlorothiazide . - Will replete as needed.  - hydrochlorothiazide  discontinued, and will not resume.    Elevated troponin, suspect demand ischemia No reported anginal symptoms. No evidence of acute ischemia on twelve-lead EKG High-sensitivity troponin flat 27, 30. -Continue to monitor on telemetry.   Hypothyroidism -Continue home levothyroxine .   Arthritis Chronic shoulders pain -As needed Tylenol . -Voltaren  gel for muscular and joint pain.   Moderate protein calorie malnutrition Moderate muscle mass loss -Encourage increase oral protein calorie intake       Subjective: Patient is feeling well. States she is feeling a little stronger.    Physical Exam: BP 136/62 (BP Location: Right Arm)   Pulse 68   Temp (!) 97.3 F (36.3 C) (Oral)   Resp 14   Ht 5' 2 (1.575 m)   Wt 54.4 kg   SpO2 99%   BMI 21.95 kg/m    General: Alert, oriented X3  Eyes: Pupils equal, reactive  Oral cavity: moist mucous membranes  Head: Atraumatic, normocephalic  Neck: supple  Chest: clear to auscultation. No crackles, no wheezes  CVS: S1,S2 RRR. No murmurs  Abd: No distention, soft, non-tender. No masses palpable  Extr: No edema   MSK: Hand deformities consistent with arthritis, tenderness on deep palpation of lateral aspect of left ankle, no swelling of left calf, no swelling of left knee Neurological: Grossly intact.     Data Reviewed:    Latest Ref Rng & Units 05/04/2024  5:02 AM 05/02/2024    5:32 AM 05/01/2024    6:15 AM  CBC  WBC 4.0 - 10.5 K/uL 5.6  6.7  7.4   Hemoglobin 12.0 - 15.0 g/dL 88.5  88.7  88.9   Hematocrit 36.0 - 46.0 % 32.7  33.5  32.2   Platelets 150 - 400 K/uL 208  187  186       Latest Ref Rng & Units 05/02/2024     5:32 AM 05/01/2024    6:15 AM 04/30/2024    5:30 AM  BMP  Glucose 70 - 99 mg/dL 93  99  88   BUN 8 - 23 mg/dL 12  15  15    Creatinine 0.44 - 1.00 mg/dL 9.53  9.45  9.49   Sodium 135 - 145 mmol/L 138  139  137   Potassium 3.5 - 5.1 mmol/L 4.0  4.2  3.7   Chloride 98 - 111 mmol/L 104  107  102   CO2 22 - 32 mmol/L 24  24  25    Calcium  8.9 - 10.3 mg/dL 8.8  8.7  8.6      Family Communication: n/a  Disposition: Status is: Observation DVT PPx: SQ Lovenox       Author: MDALA-GAUSI, Becky Whittier [name], MD 05/06/2024 2:38 PM  For on call review [email or web address].    "

## 2024-05-07 DIAGNOSIS — R531 Weakness: Secondary | ICD-10-CM | POA: Diagnosis not present

## 2024-05-07 NOTE — TOC Progression Note (Signed)
 Transition of Care [facility]) - Progression Note    Patient Details  Name: Becky Montoya MRN: [id number] Date of Birth: 1927-04-09  Transition of [facility]) CM/SW Contact  Sonda Manuella Quill, RN Phone Number: 05/07/2024, 4:23 PM  Clinical Narrative:    Plan Auth ID # [id number], Plan Auth ID # [id number] start date 05/05/24 end date 05/07/24; spoke w/ rep Metta CHRISTELLA at [facility]; she said pt has to be in facility by 05/11/24 in order for same auth to be used; per Dr Mcarthur pt likely ready for d/c tomorrow; Kristin at [facility] notified   Expected Discharge Plan: Skilled Nursing Facility Barriers to Discharge: Continued Medical Work up, SNF Pending bed offer               Expected Discharge Plan and Services In-house Referral: NA Discharge Planning Services: CM Consult Post Acute Care Choice: NA Living arrangements for the past 2 months: Independent Living Facility [facility], Clinical)                 DME Arranged: N/A DME Agency: NA       HH Arranged: NA HH Agency: NA         Social Drivers of Health (SDOH) Interventions SDOH Screenings   Food Insecurity: No Food Insecurity (04/30/2024)  Housing: Low Risk (04/30/2024)  Transportation Needs: No Transportation Needs (04/30/2024)  Utilities: Not At Risk (04/30/2024)  Social Connections: Moderately Integrated (04/30/2024)  Tobacco Use: Low Risk (05/04/2024)    Readmission Risk Interventions    03/10/2024   11:23 AM  Readmission Risk Prevention Plan  Transportation Screening Complete  PCP or Specialist Appt within 5-7 Days Complete  Home Care Screening Complete  Medication Review (RN CM) Complete

## 2024-05-07 NOTE — Plan of Care (Signed)

## 2024-05-07 NOTE — Progress Notes (Signed)
 " Progress Note   [name] FMW:[id number] DOB: December 04, 1926 DOA: 04/29/2024     0 DOS: the patient was seen and examined on 05/07/2024    Brief hospital course: Becky Montoya is a 89 y.o. female with medical history significant for arthritis, ambulatory dysfunction uses a walker, hypertension, hyperlipidemia, hypothyroidism, who presented to the ER due to generalized weakness, worsening for a few days.  ED workup revealed UA suggestive of UTI.  RVP was negative.  Patient completed a 4-day course of antibiotics.   Assessment and Plan:  Generalized weakness Likely due to UTI. Patient seen by PT and SNF recommended. -Placement pending. Per SW, bed will be available 1/30.   Left lower extremity pain due to arthritis.  Reported after walking on 1/24. Of note, patient was seen in August 2025 with left ankle pain. Patient does have some tenderness of her left lower extremity. Low risk of DVT as patient is on subcutaneous Lovenox .  No swelling noted. X-ray of the lower extremities revealed arthritis. - Continue pain management.   Presumed UTI, POA UA positive for pyuria Cultures grew Staph epidermidis. Patient received Rocephin  x 4 days. Antibiotic course completed.   Right nostril abnormality Chronic right maxillary sinusitis Patient with known history of chronic right maxillary sinusitis, and was seen by ENT in October 2025. CT face and sinuses was done on 1/23.This revealed no CT evidence of mass within the nasal cavity chronic right maxillary sinusitis with complete opacification of the right maxillary sinus and right ostiomeatal unit - Outpatient follow up   Anemia Hemoglobin trended down from baseline of approximately 14 to 11.  Now stable. Patient denies melena. - Will continue to monitor intermittently   Hypertension On Coreg , hydrochlorothiazide  and Valsartan at home.  - Continue home Coreg -dose increased due to uncontrolled pressures. - Resumed home  Valsartan. - Dced hydrochlorothiazide  as it likely contributed to electrolyte disturbance (hypokalemia). - Started on amlodipine  on 1/27 due to uncontrolled pressures.   Hypokalemia, resolved Presented with potassium 2.9.  Likely related to hydrochlorothiazide . - Will replete as needed.  - hydrochlorothiazide  discontinued, and will not resume.    Elevated troponin, suspect demand ischemia No reported anginal symptoms. No evidence of acute ischemia on twelve-lead EKG High-sensitivity troponin flat 27, 30. -Continue to monitor on telemetry.   Hypothyroidism -Continue home levothyroxine .   Arthritis Chronic shoulders pain -As needed Tylenol . -Voltaren  gel for muscular and joint pain.   Moderate protein calorie malnutrition Moderate muscle mass loss -Encourage increase oral protein calorie intake   Disposition: Skilled nursing facility placement.  Patient is medically stable for discharge     Subjective: Patient sitting in chair not in any acute distress.  Denies pain, nausea or vomiting.  Vital signs are stable Awaiting placement.  Physical Exam: BP (!) 146/66 (BP Location: Left Arm)   Pulse 76   Temp 97.8 F (36.6 C) (Oral)   Resp 12   Ht 5' 2 (1.575 m)   Wt 54.4 kg   SpO2 95%   BMI 21.95 kg/m    General: Alert, oriented X3  Eyes: Pupils equal, reactive  Oral cavity: moist mucous membranes  Head: Atraumatic, normocephalic  Neck: supple  Chest: clear to auscultation. No crackles, no wheezes  CVS: S1,S2 RRR. No murmurs  Abd: No distention, soft, non-tender. No masses palpable  Extr: No edema   MSK: Hand deformities consistent with arthritis, tenderness on deep palpation of lateral aspect of left ankle, no swelling of left calf, no swelling of left knee  Neurological: Grossly intact.     Data Reviewed:    Latest Ref Rng & Units 05/04/2024    5:02 AM 05/02/2024    5:32 AM 05/01/2024    6:15 AM  CBC  WBC 4.0 - 10.5 K/uL 5.6  6.7  7.4   Hemoglobin 12.0 - 15.0  g/dL 88.5  88.7  88.9   Hematocrit 36.0 - 46.0 % 32.7  33.5  32.2   Platelets 150 - 400 K/uL 208  187  186       Latest Ref Rng & Units 05/02/2024    5:32 AM 05/01/2024    6:15 AM 04/30/2024    5:30 AM  BMP  Glucose 70 - 99 mg/dL 93  99  88   BUN 8 - 23 mg/dL 12  15  15    Creatinine 0.44 - 1.00 mg/dL 9.53  9.45  9.49   Sodium 135 - 145 mmol/L 138  139  137   Potassium 3.5 - 5.1 mmol/L 4.0  4.2  3.7   Chloride 98 - 111 mmol/L 104  107  102   CO2 22 - 32 mmol/L 24  24  25    Calcium  8.9 - 10.3 mg/dL 8.8  8.7  8.6      Family Communication: n/a  Disposition: Status is: Observation DVT PPx: SQ Lovenox       Author: Derryl Duval, MD 05/07/2024 12:37 PM  For on call review [email or web address]    "

## 2024-05-08 DIAGNOSIS — N390 Urinary tract infection, site not specified: Secondary | ICD-10-CM | POA: Diagnosis present

## 2024-05-08 DIAGNOSIS — R531 Weakness: Secondary | ICD-10-CM | POA: Diagnosis not present

## 2024-05-08 MED ORDER — LORAZEPAM 0.5 MG PO TABS: 0.2500 mg | ORAL_TABLET | Freq: Every day | ORAL | 0 refills | Status: AC | PRN

## 2024-05-08 MED ORDER — LORAZEPAM 0.5 MG PO TABS: 0.2500 mg | ORAL_TABLET | Freq: Every day | ORAL | 0 refills | Status: DC | PRN

## 2024-05-08 MED ORDER — AMLODIPINE BESYLATE 5 MG PO TABS: 5.0000 mg | ORAL_TABLET | Freq: Every day | ORAL | 0 refills | Status: AC

## 2024-05-08 MED ORDER — CARVEDILOL 25 MG PO TABS: 25.0000 mg | ORAL_TABLET | Freq: Two times a day (BID) | ORAL | 0 refills | Status: AC

## 2024-05-08 NOTE — Plan of Care (Signed)
   Problem: Health Behavior/Discharge Planning: Goal: Ability to manage health-related needs will improve Outcome: Progressing   Problem: Clinical Measurements: Goal: Ability to maintain clinical measurements within normal limits will improve Outcome: Progressing Goal: Will remain free from infection Outcome: Progressing

## 2024-05-08 NOTE — Discharge Summary (Addendum)
 Physician Discharge Summary  Becky Montoya FMW:[id number] DOB: Feb 17, 1927 DOA: 04/29/2024  PCP: Teresa Channel, MD  Admit date: 04/29/2024 Discharge date: 05/08/2024  Admitted From: Home Disposition: SNF  Recommendations for Outpatient Follow-up:  Follow up with PCP in 1 week.  Adjust antihypertensives as needed Follow up in ED if symptoms worsen or new appear   Discharge Condition: Stable CODE STATUS: Full Diet recommendation: Heart healthy  Brief/Interim Summary:  Becky Montoya is a 89 y.o. female with medical history significant for arthritis, ambulatory dysfunction uses a walker, hypertension, hyperlipidemia, hypothyroidism, who presented to the ER due to generalized weakness, worsening for a few days.   ED workup revealed UA suggestive of UTI.  Viral respiratory panel was negative.  Patient completed a 4-day course of antibiotics.  Patient is transferred to skilled nursing facility for rehabilitation.   Assessment and Plan:   Generalized weakness/presumed UTI Likely due to UTI.  Completed antibiotic course.  Urine culture with nonsignificant growth of staph epi. - Planning to discharge to SNF for therapies  Left lower extremity pain due to arthritis.  Reported after walking on 1/24. Of note, patient was seen in August 2025 with left ankle pain. Patient does have some tenderness of her left lower extremity. X-ray of the lower extremities revealed arthritis. - Continue pain management.    Chronic right maxillary sinusitis Patient with known history of chronic right maxillary sinusitis, and was seen by ENT in October 2025. CT face and sinuses was done on 1/23.This revealed no CT evidence of mass within the nasal cavity chronic right maxillary sinusitis with complete opacification of the right maxillary sinus and right ostiomeatal unit - Outpatient follow up    Anemia Baseline 12-14.  It is stable  Hypertension On Coreg , hydrochlorothiazide  and Valsartan at home.  -  Continue home Coreg -dose increased to 25 mg twice daily due to increased pressures - Resumed home Valsartan. - Dced hydrochlorothiazide  as it likely contributed to electrolyte disturbance (hypokalemia). - Started on amlodipine  on 1/27 due to uncontrolled pressures.  -Needs follow-up outpatient   Hypokalemia, resolved Presented with potassium 2.9.  Likely related to hydrochlorothiazide  which was discontinued    Elevated troponin, elevated to 27 and flat. not consistent with ACS.  Hypothyroidism -Continue home levothyroxine .   Arthritis Chronic shoulders pain -As needed Tylenol . -Voltaren  gel for muscular and joint pain.   Moderate protein calorie malnutrition Moderate muscle mass loss -Encourage increase oral protein calorie intake   Disposition: Skilled nursing facility placement.  Patient is medically stable for discharge         Subjective: Patient sitting in chair not in any acute distress.  Denies pain, nausea or vomiting.  Vital signs are stable Awaiting placement.     General: Alert, oriented X3  Eyes: Pupils equal, reactive  Oral cavity: moist mucous membranes  Head: Atraumatic, normocephalic  Neck: supple  Chest: clear to auscultation. No crackles, no wheezes  CVS: S1,S2 RRR. No murmurs  Abd: No distention, soft, non-tender. No masses palpable  Extr: No edema   MSK: Hand deformities consistent with arthritis, tenderness on deep palpation of lateral aspect of left ankle, no swelling of left calf, no swelling of left knee Neurological: Grossly intact.     Discharge Diagnoses:  Principal Problem:   Generalized weakness Active Problems:   UTI (urinary tract infection)   Hypothyroidism   Hypertension   Hyperlipidemia   Malignant neoplasm of upper-outer quadrant of left breast in female, estrogen receptor positive [facility])    Discharge Instructions  Discharge Instructions     Call MD for:  temperature >100.4   Complete by: As directed    Diet - low  sodium heart healthy   Complete by: As directed    Discharge instructions   Complete by: As directed    1.  Stop hydrochlorothiazide  due to hypokalemia.  Amlodipine  added and carvedilol  increased to 25 twice daily for blood pressure control. 2.  Continue with therapies.   Increase activity slowly   Complete by: As directed       Allergies as of 05/08/2024       Reactions   Wellbutrin [bupropion] Other (See Comments)   Nervousness        Medication List     STOP taking these medications    hydrochlorothiazide  12.5 MG tablet Commonly known as: HYDRODIURIL    oxyCODONE  5 MG immediate release tablet Commonly known as: Oxy IR/ROXICODONE        TAKE these medications    acetaminophen  500 MG tablet Commonly known as: TYLENOL  Take 1 tablet (500 mg total) by mouth every 6 (six) hours as needed for mild pain (pain score 1-3), fever or headache.   amLODipine  5 MG tablet Commonly known as: NORVASC  Take 1 tablet (5 mg total) by mouth daily. Start taking on: May 09, 2024   aspirin  EC 81 MG tablet Take 81 mg by mouth every morning.   carvedilol  25 MG tablet Commonly known as: COREG  Take 1 tablet (25 mg total) by mouth 2 (two) times daily with a meal. What changed:  medication strength how much to take   COQ10 PO Take 1 Dose by mouth every evening.   FIBER PO Take 1 Dose by mouth daily.   fluticasone 50 MCG/ACT nasal spray Commonly known as: FLONASE Place 2 sprays into both nostrils 2 (two) times daily.   levothyroxine  50 MCG tablet Commonly known as: SYNTHROID  Take 50 mcg by mouth every morning.   lidocaine  5 % Commonly known as: Lidoderm  Place 1 patch onto the skin daily. Remove & Discard patch within 12 hours or as directed by MD   LORazepam  0.5 MG tablet Commonly known as: ATIVAN  Take 0.5-1 tablets (0.25-0.5 mg total) by mouth daily as needed for anxiety or sleep.   Lutein 20 Caps Take 1 capsule by mouth 2 (two) times daily.   mirtazapine  15 MG  tablet Commonly known as: REMERON  Take 15 mg by mouth at bedtime.   MULTIVITAMIN PO Take 1 Dose by mouth in the morning and at bedtime.   OMEGA 3 PO Take 1 capsule by mouth at bedtime.   polyethylene glycol 17 g packet Commonly known as: MIRALAX  / GLYCOLAX  Take 17 g by mouth daily as needed for mild constipation.   PROBIOTIC PO Take 1 Dose by mouth daily.   senna-docusate 8.6-50 MG tablet Commonly known as: Senokot-S Take 2 tablets by mouth at bedtime as needed for mild constipation or moderate constipation.   simvastatin  20 MG tablet Commonly known as: ZOCOR  Take 20 mg by mouth every evening.   valsartan 320 MG tablet Commonly known as: DIOVAN Take 320 mg by mouth every evening.        Contact information for follow-up providers     Teresa Channel, MD. Schedule an appointment as soon as possible for a visit in 1 week(s).   Specialty: Family Medicine Contact information: [address], [address] [place] [address] [phone]              Contact information for after-discharge care  Destination     [facility] .   Service: Skilled Nursing Contact information: [address] [place]  [address] [phone]                    Allergies[1]  Consultations:    Procedures/Studies: DG Tibia/Fibula Left Result Date: 05/03/2024 CLINICAL DATA:  Acute pain of left lower extremity. EXAM: LEFT TIBIA AND FIBULA - 2 VIEW; LEFT ANKLE - 2 VIEW COMPARISON:  11/08/2023. FINDINGS: There is no evidence of acute fracture or dislocation. No bony erosion or periosteal elevation is seen. Moderate calcaneal spurring is noted. There is diffuse soft tissue swelling. Vascular calcifications are noted in the soft tissues. Mild degenerative changes are noted in the medial compartment of the knee. No joint effusion is seen at the knee. There is partial visualization fixation hardware in the distal femur.  IMPRESSION: No acute fracture or dislocation. Electronically Signed   By: Leita Birmingham M.D.   On: 05/03/2024 15:40   DG Ankle 2 Views Left Result Date: 05/03/2024 CLINICAL DATA:  Acute pain of left lower extremity. EXAM: LEFT TIBIA AND FIBULA - 2 VIEW; LEFT ANKLE - 2 VIEW COMPARISON:  11/08/2023. FINDINGS: There is no evidence of acute fracture or dislocation. No bony erosion or periosteal elevation is seen. Moderate calcaneal spurring is noted. There is diffuse soft tissue swelling. Vascular calcifications are noted in the soft tissues. Mild degenerative changes are noted in the medial compartment of the knee. No joint effusion is seen at the knee. There is partial visualization fixation hardware in the distal femur. IMPRESSION: No acute fracture or dislocation. Electronically Signed   By: Leita Birmingham M.D.   On: 05/03/2024 15:40   CT MAXILLOFACIAL W CONTRAST Result Date: 05/01/2024 EXAM: CT Facial Bones with contrast 05/01/2024 02:36:16 PM TECHNIQUE: CT of the facial bones was performed with the administration of intravenous contrast. Multiplanar reformatted images are provided for review. Automated exposure control, iterative reconstruction, and/or weight based adjustment of the mA/kV was utilized to reduce the radiation dose to as low as reasonably achievable. CONTRAST: 75 mL Omnipaque  300. COMPARISON: None available CLINICAL HISTORY: Suspicion for right nostril mass. FINDINGS: AERODIGESTIVE TRACT: Subtle narrowing of the internal nasal valves right greater than left. Mild mucosal thickening and secretions within the inferior aspect of the right nasal cavity. There is no CT evidence of mass within the nasal cavity. No mass. No edema. SALIVARY GLANDS: No acute abnormality. LYMPH NODES: No suspicious cervical lymphadenopathy. SOFT TISSUES: No mass or fluid collection. BRAIN, ORBITS AND SINUSES: Bilateral lens replacement. The orbits are otherwise unremarkable. Complete opacification of the right maxillary  sinus with thickening of the right maxillary sinus wall suggestive of mucoperiosteal reaction compatible with chronic right maxillary sinusitis. Complete opacification of the right ostiomeatal unit. Minimal mucosal thickening in the alveolar recess of the left maxillary sinus. Mild scattered mucosal thickening in the ethmoid sinuses, most pronounced in the left anterior ethmoid air cells. The frontal sinuses are significantly hypoplastic. Mucosal thickening in the bilateral sphenoid sinuses. Likely mucous retention cyst in the posterior aspect of the right sphenoid sinus. Brain: No acute abnormality. BONES: No acute abnormality of the facial bones. No suspicious bone lesion. Similar degenerative changes in the visualized cervical spine. Similar appearance of anterolisthesis at C4-5. IMPRESSION: 1. No CT evidence of mass within the nasal cavity. 2. Chronic right maxillary sinusitis with complete opacification of the right maxillary sinus and right ostiomeatal unit. Electronically signed by: Donnice Mania MD 05/01/2024  04:23 PM EST RP Workstation: [id number]   DG Chest Portable 1 View Result Date: 04/29/2024 EXAM: 1 VIEW(S) XRAY OF THE CHEST 04/29/2024 06:35:00 PM COMPARISON: 03/08/2024 CLINICAL HISTORY: weak weak weak weak weak FINDINGS: LUNGS AND PLEURA: No focal pulmonary opacity. No pleural effusion. No pneumothorax. HEART AND MEDIASTINUM: Aortic atherosclerosis. No acute abnormality of the cardiac and mediastinal silhouettes. BONES AND SOFT TISSUES: No acute osseous abnormality. IMPRESSION: 1. No acute findings. Electronically signed by: Greig Pique MD 04/29/2024 06:41 PM EST RP Workstation: [id number]   XR Shoulder Left Result Date: 04/22/2024 X-rays demonstrate advanced glenohumeral and AC joint degenerative changes.  No acute fracture noted.    Discharge Exam: Vitals:   05/08/24 0448 05/08/24 0951  BP: (!) 177/73 (!) 177/73  Pulse: 76 76  Resp:    Temp:    SpO2: 95%      The results of  significant diagnostics from this hospitalization (including imaging, microbiology, ancillary and laboratory) are listed below for reference.     Microbiology: Recent Results (from the past 240 hours)  Resp panel by RT-PCR (RSV, Flu A&B, Covid) Anterior Nasal Swab     Status: None   Collection Time: 04/29/24  6:28 PM   Specimen: Anterior Nasal Swab  Result Value Ref Range Status   SARS Coronavirus 2 by RT PCR NEGATIVE NEGATIVE Final    Comment: (NOTE) SARS-CoV-2 target nucleic acids are NOT DETECTED.  The SARS-CoV-2 RNA is generally detectable in upper respiratory specimens during the acute phase of infection. The lowest concentration of SARS-CoV-2 viral copies this assay can detect is 138 copies/mL. A negative result does not preclude SARS-Cov-2 infection and should not be used as the sole basis for treatment or other patient management decisions. A negative result may occur with  improper specimen collection/handling, submission of specimen other than nasopharyngeal swab, presence of viral mutation(s) within the areas targeted by this assay, and inadequate number of viral copies(<138 copies/mL). A negative result must be combined with clinical observations, patient history, and epidemiological information. The expected result is Negative.  Fact Sheet for Patients:  bloggercourse.com  Fact Sheet for Healthcare Providers:  seriousbroker.it  This test is no t yet approved or cleared by the [place]  FDA and  has been authorized for detection and/or diagnosis of SARS-CoV-2 by FDA under an Emergency Use Authorization (EUA). This EUA will remain  in effect (meaning this test can be used) for the duration of the COVID-19 declaration under Section 564(b)(1) of the Act, 21 U.S.C.section 360bbb-3(b)(1), unless the authorization is terminated  or revoked sooner.       Influenza A by PCR NEGATIVE NEGATIVE Final   Influenza B by PCR  NEGATIVE NEGATIVE Final    Comment: (NOTE) The Xpert Xpress SARS-CoV-2/FLU/RSV plus assay is intended as an aid in the diagnosis of influenza from Nasopharyngeal swab specimens and should not be used as a sole basis for treatment. Nasal washings and aspirates are unacceptable for Xpert Xpress SARS-CoV-2/FLU/RSV testing.  Fact Sheet for Patients: bloggercourse.com  Fact Sheet for Healthcare Providers: seriousbroker.it  This test is not yet approved or cleared by the [place]  FDA and has been authorized for detection and/or diagnosis of SARS-CoV-2 by FDA under an Emergency Use Authorization (EUA). This EUA will remain in effect (meaning this test can be used) for the duration of the COVID-19 declaration under Section 564(b)(1) of the Act, 21 U.S.C. section 360bbb-3(b)(1), unless the authorization is terminated or revoked.     Resp Syncytial Virus by PCR NEGATIVE NEGATIVE Final  Comment: (NOTE) Fact Sheet for Patients: bloggercourse.com  Fact Sheet for Healthcare Providers: seriousbroker.it  This test is not yet approved or cleared by the [place]  FDA and has been authorized for detection and/or diagnosis of SARS-CoV-2 by FDA under an Emergency Use Authorization (EUA). This EUA will remain in effect (meaning this test can be used) for the duration of the COVID-19 declaration under Section 564(b)(1) of the Act, 21 U.S.C. section 360bbb-3(b)(1), unless the authorization is terminated or revoked.  Performed at [facility], [address] [place], [place] [address]   Urine Culture     Status: Abnormal   Collection Time: 04/29/24 10:30 PM   Specimen: Urine, Clean Catch  Result Value Ref Range Status   Specimen Description   Final    URINE, CLEAN CATCH Performed at [facility], [address] [place], [place] [address]     Special Requests   Final    NONE Performed at [facility], [address] [place], [place] [address]    Culture (A)  Final    20,000 COLONIES/mL STAPHYLOCOCCUS EPIDERMIDIS WITHIN MIXED CULTURE Performed at [facility], [address], [place], [place] [address]    Report Status 05/02/2024 FINAL  Final   Organism ID, Bacteria STAPHYLOCOCCUS EPIDERMIDIS (A)  Final      Susceptibility   Staphylococcus epidermidis - MIC*    CIPROFLOXACIN <=0.5 SENSITIVE Sensitive     GENTAMICIN <=0.5 SENSITIVE Sensitive     NITROFURANTOIN <=16 SENSITIVE Sensitive     OXACILLIN <=0.25 SENSITIVE Sensitive     TETRACYCLINE >=16 RESISTANT Resistant     VANCOMYCIN 1 SENSITIVE Sensitive     TRIMETH/SULFA <=10 SENSITIVE Sensitive     RIFAMPIN <=0.5 SENSITIVE Sensitive     Inducible Clindamycin NEGATIVE Sensitive     * 20,000 COLONIES/mL STAPHYLOCOCCUS EPIDERMIDIS     Labs: BNP (last 3 results) No results for input(s): BNP in the last 8760 hours. Basic Metabolic Panel: Recent Labs  Lab 05/02/24 0532  NA 138  K 4.0  CL 104  CO2 24  GLUCOSE 93  BUN 12  CREATININE 0.46  CALCIUM  8.8*   Liver Function Tests: No results for input(s): AST, ALT, ALKPHOS, BILITOT, PROT, ALBUMIN in the last 168 hours. No results for input(s): LIPASE, AMYLASE in the last 168 hours. No results for input(s): AMMONIA in the last 168 hours. CBC: Recent Labs  Lab 05/02/24 0532 05/04/24 0502  WBC 6.7 5.6  HGB 11.2* 11.4*  HCT 33.5* 32.7*  MCV 92.5 91.1  PLT 187 208   Cardiac Enzymes: No results for input(s): CKTOTAL, CKMB, CKMBINDEX, TROPONINI in the last 168 hours. BNP: Invalid input(s): POCBNP CBG: No results for input(s): GLUCAP in the last 168 hours. D-Dimer No results for input(s): DDIMER in the last 72 hours. Hgb A1c No results for input(s): HGBA1C in the last 72 hours. Lipid Profile No results for input(s): CHOL, HDL, LDLCALC, TRIG,  CHOLHDL, LDLDIRECT in the last 72 hours. Thyroid  function studies No results for input(s): TSH, T4TOTAL, T3FREE, THYROIDAB in the last 72 hours.  Invalid input(s): FREET3 Anemia work up No results for input(s): VITAMINB12, FOLATE, FERRITIN, TIBC, IRON, RETICCTPCT in the last 72 hours. Urinalysis    Component Value Date/Time   COLORURINE YELLOW 04/29/2024 2230   APPEARANCEUR HAZY (A) 04/29/2024 2230   LABSPEC 1.005 04/29/2024 2230   PHURINE 6.0 04/29/2024 2230   GLUCOSEU NEGATIVE 04/29/2024 2230   HGBUR LARGE (A) 04/29/2024 2230   BILIRUBINUR NEGATIVE  04/29/2024 2230   KETONESUR 5 (A) 04/29/2024 2230   PROTEINUR 30 (A) 04/29/2024 2230   NITRITE NEGATIVE 04/29/2024 2230   LEUKOCYTESUR SMALL (A) 04/29/2024 2230   Sepsis Labs Recent Labs  Lab 05/02/24 0532 05/04/24 0502  WBC 6.7 5.6   Microbiology Recent Results (from the past 240 hours)  Resp panel by RT-PCR (RSV, Flu A&B, Covid) Anterior Nasal Swab     Status: None   Collection Time: 04/29/24  6:28 PM   Specimen: Anterior Nasal Swab  Result Value Ref Range Status   SARS Coronavirus 2 by RT PCR NEGATIVE NEGATIVE Final    Comment: (NOTE) SARS-CoV-2 target nucleic acids are NOT DETECTED.  The SARS-CoV-2 RNA is generally detectable in upper respiratory specimens during the acute phase of infection. The lowest concentration of SARS-CoV-2 viral copies this assay can detect is 138 copies/mL. A negative result does not preclude SARS-Cov-2 infection and should not be used as the sole basis for treatment or other patient management decisions. A negative result may occur with  improper specimen collection/handling, submission of specimen other than nasopharyngeal swab, presence of viral mutation(s) within the areas targeted by this assay, and inadequate number of viral copies(<138 copies/mL). A negative result must be combined with clinical observations, patient history, and epidemiological information.  The expected result is Negative.  Fact Sheet for Patients:  bloggercourse.com  Fact Sheet for Healthcare Providers:  seriousbroker.it  This test is no t yet approved or cleared by the [place]  FDA and  has been authorized for detection and/or diagnosis of SARS-CoV-2 by FDA under an Emergency Use Authorization (EUA). This EUA will remain  in effect (meaning this test can be used) for the duration of the COVID-19 declaration under Section 564(b)(1) of the Act, 21 U.S.C.section 360bbb-3(b)(1), unless the authorization is terminated  or revoked sooner.       Influenza A by PCR NEGATIVE NEGATIVE Final   Influenza B by PCR NEGATIVE NEGATIVE Final    Comment: (NOTE) The Xpert Xpress SARS-CoV-2/FLU/RSV plus assay is intended as an aid in the diagnosis of influenza from Nasopharyngeal swab specimens and should not be used as a sole basis for treatment. Nasal washings and aspirates are unacceptable for Xpert Xpress SARS-CoV-2/FLU/RSV testing.  Fact Sheet for Patients: bloggercourse.com  Fact Sheet for Healthcare Providers: seriousbroker.it  This test is not yet approved or cleared by the [place]  FDA and has been authorized for detection and/or diagnosis of SARS-CoV-2 by FDA under an Emergency Use Authorization (EUA). This EUA will remain in effect (meaning this test can be used) for the duration of the COVID-19 declaration under Section 564(b)(1) of the Act, 21 U.S.C. section 360bbb-3(b)(1), unless the authorization is terminated or revoked.     Resp Syncytial Virus by PCR NEGATIVE NEGATIVE Final    Comment: (NOTE) Fact Sheet for Patients: bloggercourse.com  Fact Sheet for Healthcare Providers: seriousbroker.it  This test is not yet approved or cleared by the [place]  FDA and has been authorized for detection and/or  diagnosis of SARS-CoV-2 by FDA under an Emergency Use Authorization (EUA). This EUA will remain in effect (meaning this test can be used) for the duration of the COVID-19 declaration under Section 564(b)(1) of the Act, 21 U.S.C. section 360bbb-3(b)(1), unless the authorization is terminated or revoked.  Performed at [facility], [address] [place], [place] [address]   Urine Culture     Status: Abnormal   Collection Time: 04/29/24 10:30 PM   Specimen: Urine, Clean Catch  Result  Value Ref Range Status   Specimen Description   Final    URINE, CLEAN CATCH Performed at [facility], [address], [place], [place] [address]    Special Requests   Final    NONE Performed at [facility], [address] [place], [place] [address]    Culture (A)  Final    20,000 COLONIES/mL STAPHYLOCOCCUS EPIDERMIDIS WITHIN MIXED CULTURE Performed at [facility], [address], [place], [place] [address]    Report Status 05/02/2024 FINAL  Final   Organism ID, Bacteria STAPHYLOCOCCUS EPIDERMIDIS (A)  Final      Susceptibility   Staphylococcus epidermidis - MIC*    CIPROFLOXACIN <=0.5 SENSITIVE Sensitive     GENTAMICIN <=0.5 SENSITIVE Sensitive     NITROFURANTOIN <=16 SENSITIVE Sensitive     OXACILLIN <=0.25 SENSITIVE Sensitive     TETRACYCLINE >=16 RESISTANT Resistant     VANCOMYCIN 1 SENSITIVE Sensitive     TRIMETH/SULFA <=10 SENSITIVE Sensitive     RIFAMPIN <=0.5 SENSITIVE Sensitive     Inducible Clindamycin NEGATIVE Sensitive     * 20,000 COLONIES/mL STAPHYLOCOCCUS EPIDERMIDIS     Time coordinating discharge: 35 minutes  SIGNED:   Derryl Duval, MD  [facility] 05/08/2024, 10:34 AM       [1]  Allergies Allergen Reactions   Wellbutrin [Bupropion] Other (See Comments)    Nervousness

## 2024-05-08 NOTE — Progress Notes (Signed)
 RN called [employer] [facility], and give report to [place] an LPN. Patient is awaiting [facility] for transport.

## 2024-05-08 NOTE — TOC Transition Note (Signed)
 Transition of Care [facility]) - Discharge Note   Patient Details  Name: Becky Montoya MRN: [id number] Date of Birth: Aug 04, 1926  Transition of Care [facility]) CM/SW Contact:  Sonda Manuella Quill, RN Phone Number: 05/08/2024, 11:27 AM   Clinical Narrative:    D/C orders received; D/C summary and SNF transfer report sent via hub; Allean, Admissions at [facility] gave RM #[phone], call report # [phone]; [facility] for transport; pt and dtr Kate Lee [phone]) notified and agree to d/c plan [facility] called for transport at 1130; spoke w/ Rick; no IP CM needs.   Final next level of care: Skilled Nursing Facility Barriers to Discharge: No Barriers Identified   Patient Goals and CMS Choice Patient states their goals for this hospitalization and ongoing recovery are:: to [email or web address].gov Compare Post Acute Care list provided to:: Patient Choice offered to / list presented to : Patient  ownership interest in [facility].provided to:: Patient    Discharge Placement              Patient chooses bed at: [facility] Patient to be transferred to facility by: [facility] Name of family member notified: Kate Lee (daughter) [phone] Patient and family notified of of transfer: 05/08/24  Discharge Plan and Services Additional resources added to the After Visit Summary for   In-house Referral: NA Discharge Planning Services: CM Consult Post Acute Care Choice: NA          DME Arranged: N/A DME Agency: NA       HH Arranged: NA HH Agency: NA        Social Drivers of Health (SDOH) Interventions SDOH Screenings   Food Insecurity: No Food Insecurity (04/30/2024)  Housing: Low Risk (04/30/2024)  Transportation Needs: No Transportation Needs (04/30/2024)  Utilities: Not At Risk (04/30/2024)  Social Connections: Moderately Integrated (04/30/2024)  Tobacco Use: Low Risk (05/04/2024)     Readmission Risk Interventions    03/10/2024   11:23 AM   Readmission Risk Prevention Plan  Transportation Screening Complete  PCP or Specialist Appt within 5-7 Days Complete  Home Care Screening Complete  Medication Review (RN CM) Complete

## 2024-05-08 NOTE — Progress Notes (Signed)
 Physical Therapy Treatment Patient Details Name: Becky Montoya MRN: [id number] DOB: May 02, 1926 Today's Date: 05/08/2024   History of Present Illness Pt is a 89 y.o. female presenting to [facility] ED  04/29/24 with weakness, pyuria.PMH: fractures of the left transverse process of L1, L2, L3 and L5, arthritis, breast cancer, HLD, HTN, osteoporosis    PT Comments  Pt continues to make gradual progress.  She was able to increase gait distance and performed multiple transfers.  Cont POC.  Pt expecting to d/c to SNF today.     If plan is discharge home, recommend the following: A little help with walking and/or transfers;A little help with bathing/dressing/bathroom;Assistance with cooking/housework;Assist for transportation;Help with stairs or ramp for entrance   Can travel by private vehicle        Equipment Recommendations  None recommended by PT    Recommendations for Other Services       Precautions / Restrictions Precautions Precautions: Fall     Mobility  Bed Mobility               General bed mobility comments: OOB in recliner at arrival    Transfers Overall transfer level: Needs assistance Equipment used: Rolling walker (2 wheels) Transfers: Sit to/from Stand Sit to Stand: Contact guard assist, Min assist           General transfer comment: STS x 5 during session varying from CGA to light min A; cues for hand placement; min A with pulling up pants after toielting    Ambulation/Gait Ambulation/Gait assistance: Contact guard assist, Min assist Gait Distance (Feet): 30 Feet (30'x2 then 12 ') Assistive device: Rolling walker (2 wheels) Gait Pattern/deviations: Step-to pattern, Trunk flexed Gait velocity: decreased     General Gait Details: Overall CGA but did need some min A to navigate RW and chair follow for rest breaks.   Stairs             Wheelchair Mobility     Tilt Bed    Modified Rankin (Stroke Patients Only)       Balance Overall  balance assessment: Needs assistance, History of Falls Sitting-balance support: Feet supported, No upper extremity supported Sitting balance-Leahy Scale: Good     Standing balance support: Bilateral upper extremity supported, During functional activity Standing balance-Leahy Scale: Poor Standing balance comment: Needs some support.  Did use UE to pull up Depends and pants after toielting but was bracing with legs on toilet                            Communication    Cognition Arousal: Alert Behavior During Therapy: WFL for tasks assessed/performed   PT - Cognitive impairments: No apparent impairments                                Cueing    Exercises      General Comments        Pertinent Vitals/Pain Pain Assessment Pain Assessment: No/denies pain    Home Living                          Prior Function            PT Goals (current goals can now be found in the care plan section) Progress towards PT goals: Progressing toward goals    Frequency    Min 2X/week  PT Plan      Co-evaluation              AM-PAC PT 6 Clicks Mobility   Outcome Measure  Help needed turning from your back to your side while in a flat bed without using bedrails?: A Little Help needed moving from lying on your back to sitting on the side of a flat bed without using bedrails?: A Little Help needed moving to and from a bed to a chair (including a wheelchair)?: A Little Help needed standing up from a chair using your arms (e.g., wheelchair or bedside chair)?: A Little Help needed to walk in hospital room?: A Little Help needed climbing 3-5 steps with a railing? : A Little 6 Click Score: 18    End of Session Equipment Utilized During Treatment: Gait belt Activity Tolerance: Patient tolerated treatment well Patient left: with chair alarm set;in chair;with call bell/phone within reach Nurse Communication: Mobility status PT Visit Diagnosis:  Unsteadiness on feet (R26.81);History of falling (Z91.81);Difficulty in walking, not elsewhere classified (R26.2)     Time: 8853-8792 PT Time Calculation (min) (ACUTE ONLY): 21 min  Charges:    $Gait Training: 8-22 mins PT General Charges $$ ACUTE PT VISIT: 1 Visit                     Becky, PT [facility] [phone]    Becky Montoya 05/08/2024, 1:43 PM
